# Patient Record
Sex: Female | Born: 1960
Health system: Southern US, Community
[De-identification: ages and names within clinical notes are randomized; demographics above are authoritative.]

## PROBLEM LIST (undated history)

## (undated) DIAGNOSIS — R32 Unspecified urinary incontinence: Secondary | ICD-10-CM

## (undated) DIAGNOSIS — D649 Anemia, unspecified: Secondary | ICD-10-CM

## (undated) HISTORY — DX: Anemia, unspecified: D64.9

## (undated) HISTORY — DX: Unspecified urinary incontinence: R32

---

## 1989-06-19 HISTORY — PX: TUBAL LIGATION: SHX77

## 2003-01-20 ENCOUNTER — Encounter: Payer: Self-pay | Admitting: Family Medicine

## 2003-01-20 ENCOUNTER — Encounter: Admission: RE | Admit: 2003-01-20 | Discharge: 2003-01-20 | Payer: Self-pay | Admitting: Family Medicine

## 2003-02-20 ENCOUNTER — Encounter: Payer: Self-pay | Admitting: Family Medicine

## 2003-02-20 ENCOUNTER — Encounter: Admission: RE | Admit: 2003-02-20 | Discharge: 2003-02-20 | Payer: Self-pay | Admitting: Family Medicine

## 2003-02-27 ENCOUNTER — Encounter (INDEPENDENT_AMBULATORY_CARE_PROVIDER_SITE_OTHER): Payer: Self-pay | Admitting: Specialist

## 2003-02-27 ENCOUNTER — Ambulatory Visit (HOSPITAL_BASED_OUTPATIENT_CLINIC_OR_DEPARTMENT_OTHER): Admission: RE | Admit: 2003-02-27 | Discharge: 2003-02-27 | Payer: Self-pay | Admitting: Surgery

## 2004-04-08 ENCOUNTER — Other Ambulatory Visit: Admission: RE | Admit: 2004-04-08 | Discharge: 2004-04-08 | Payer: Self-pay | Admitting: Family Medicine

## 2004-06-19 HISTORY — PX: LIPOMA EXCISION: SHX5283

## 2006-05-08 ENCOUNTER — Encounter: Admission: RE | Admit: 2006-05-08 | Discharge: 2006-05-08 | Payer: Self-pay | Admitting: Family Medicine

## 2007-05-13 ENCOUNTER — Encounter: Admission: RE | Admit: 2007-05-13 | Discharge: 2007-05-13 | Payer: Self-pay | Admitting: Family Medicine

## 2008-05-18 ENCOUNTER — Encounter: Admission: RE | Admit: 2008-05-18 | Discharge: 2008-05-18 | Payer: Self-pay | Admitting: Family Medicine

## 2008-05-20 ENCOUNTER — Encounter: Admission: RE | Admit: 2008-05-20 | Discharge: 2008-05-20 | Payer: Self-pay | Admitting: Family Medicine

## 2009-04-16 ENCOUNTER — Emergency Department (HOSPITAL_COMMUNITY): Admission: EM | Admit: 2009-04-16 | Discharge: 2009-04-16 | Payer: Self-pay | Admitting: Emergency Medicine

## 2009-04-16 ENCOUNTER — Ambulatory Visit: Payer: Self-pay | Admitting: Family Medicine

## 2009-04-16 DIAGNOSIS — R109 Unspecified abdominal pain: Secondary | ICD-10-CM | POA: Insufficient documentation

## 2009-04-16 LAB — CONVERTED CEMR LAB
Nitrite: NEGATIVE
Urobilinogen, UA: 0.2
pH: >9

## 2009-04-17 ENCOUNTER — Encounter: Payer: Self-pay | Admitting: Family Medicine

## 2009-04-17 ENCOUNTER — Emergency Department (HOSPITAL_COMMUNITY): Admission: EM | Admit: 2009-04-17 | Discharge: 2009-04-17 | Payer: Self-pay | Admitting: Emergency Medicine

## 2009-05-10 ENCOUNTER — Encounter: Admission: RE | Admit: 2009-05-10 | Discharge: 2009-05-10 | Payer: Self-pay | Admitting: Family Medicine

## 2010-05-16 ENCOUNTER — Encounter: Admission: RE | Admit: 2010-05-16 | Discharge: 2010-05-16 | Payer: Self-pay | Admitting: Family Medicine

## 2010-11-04 NOTE — Op Note (Signed)
   NAME:  [name], [name]                         ACCOUNT NO.:  [id number]   MEDICAL RECORD NO.:  [id number]                   PATIENT TYPE:  AMB   LOCATION:  DSC                                  FACILITY:  [facility]   PHYSICIAN:  Abigail Miyamoto, M.D.              DATE OF BIRTH:  [birth year]   DATE OF PROCEDURE:  02/27/2003  DATE OF DISCHARGE:                                 OPERATIVE REPORT   PREOPERATIVE DIAGNOSIS:  Left chest wall mass.   POSTOPERATIVE DIAGNOSIS:  Left chest wall mass.   PROCEDURE:  Excision of left chest wall mass.   SURGEON:  Abigail Miyamoto, M.D.   ANESTHESIA:  1% lidocaine and monitored anesthesia care.   ESTIMATED BLOOD LOSS:  Minimal.   PROCEDURE IN DETAIL:  The patient was brought to the operating room,  identified as Anne Greer.  She was placed supine on the operating room  table, and anesthesia was induced.  Her left chest underneath the breast on  the anterior surface was then prepped and draped in the usual sterile  fashion.  The palpable mass was identified right along the costal margin.  The skin overlying the mass was anesthetized with 1% lidocaine.  An incision  was then made in the skin transversely across the mass.  The incision was  taken down into the subcutaneous tissue, and the mass was identified and  found to be consistent with a small approximately 2 cm in size lipoma.  The  mass was excised and sent to pathology for identification.  No other  abnormalities were identified.  The wound was then thoroughly irrigated with  saline.  The subcutaneous layer was then closed with interrupted 3-0 Vicryl  sutures, and the skin was closed with a running 4-0 Monocryl.  Steri-Strips,  gauze, and Tegaderm was then applied.  The patient tolerated the procedure  well.  All sponge, needle, and instrument counts were correct at the end of  the procedure.  The patient was then taken in a stable condition from the  operating room to the recovery  room.                                               Abigail Miyamoto, M.D.    DB/MEDQ  D:  02/27/2003  T:  02/28/2003  Job:  [id number]

## 2011-04-26 ENCOUNTER — Other Ambulatory Visit: Payer: Self-pay | Admitting: Family Medicine

## 2011-04-26 DIAGNOSIS — Z1231 Encounter for screening mammogram for malignant neoplasm of breast: Secondary | ICD-10-CM

## 2011-05-19 ENCOUNTER — Ambulatory Visit
Admission: RE | Admit: 2011-05-19 | Discharge: 2011-05-19 | Disposition: A | Discharge status: Home / Self Care | Payer: BC Managed Care – PPO | Source: Ambulatory Visit | Attending: Family Medicine | Admitting: Family Medicine

## 2011-05-19 DIAGNOSIS — Z1231 Encounter for screening mammogram for malignant neoplasm of breast: Secondary | ICD-10-CM

## 2012-05-01 ENCOUNTER — Other Ambulatory Visit: Payer: Self-pay | Admitting: Family Medicine

## 2012-05-01 DIAGNOSIS — Z1231 Encounter for screening mammogram for malignant neoplasm of breast: Secondary | ICD-10-CM

## 2012-05-27 ENCOUNTER — Ambulatory Visit
Admission: RE | Admit: 2012-05-27 | Discharge: 2012-05-27 | Disposition: A | Discharge status: Home / Self Care | Payer: BC Managed Care – PPO | Source: Ambulatory Visit | Attending: Family Medicine | Admitting: Family Medicine

## 2012-05-27 DIAGNOSIS — Z1231 Encounter for screening mammogram for malignant neoplasm of breast: Secondary | ICD-10-CM

## 2013-01-23 ENCOUNTER — Ambulatory Visit (INDEPENDENT_AMBULATORY_CARE_PROVIDER_SITE_OTHER): Payer: BC Managed Care – PPO | Admitting: Gynecology

## 2013-01-23 ENCOUNTER — Encounter: Payer: Self-pay | Admitting: Gynecology

## 2013-01-23 VITALS — BP 112/60 | HR 70 | Resp 18 | Ht 65.0 in | Wt 185.0 lb

## 2013-01-23 DIAGNOSIS — N920 Excessive and frequent menstruation with regular cycle: Secondary | ICD-10-CM

## 2013-01-23 DIAGNOSIS — N852 Hypertrophy of uterus: Secondary | ICD-10-CM

## 2013-01-23 DIAGNOSIS — Z0000 Encounter for general adult medical examination without abnormal findings: Secondary | ICD-10-CM

## 2013-01-23 DIAGNOSIS — D62 Acute posthemorrhagic anemia: Secondary | ICD-10-CM

## 2013-01-23 LAB — CBC
HCT: 34.1 % — ABNORMAL LOW (ref 36.0–46.0)
Hemoglobin: 11.1 g/dL — ABNORMAL LOW (ref 12.0–15.0)
MCHC: 32.6 g/dL (ref 30.0–36.0)
MCV: 88.3 fL (ref 78.0–100.0)
RDW: 14.1 % (ref 11.5–15.5)
WBC: 8.9 10*3/uL (ref 4.0–10.5)

## 2013-01-23 LAB — POCT URINALYSIS DIPSTICK: pH, UA: 5

## 2013-01-23 MED ORDER — NORETHINDRONE ACETATE 5 MG PO TABS: 5.0000 mg | ORAL_TABLET | Freq: Three times a day (TID) | ORAL | Status: DC

## 2013-01-23 NOTE — Progress Notes (Signed)
52 y.o. Engaged Caucasian female  G2P2002  here for problem visit. Pt reports menses are not regular.  She does report hot flashes, does have night sweats, does have vaginal dryness.  She is using lubricants, otc lubricant.  She does report peri-menopasual bleeding.  Pt reports cycles close to monthly, last 2 cycles very light brown.  Pt states that she started heavy bleeding red with clots plum size, bleeding started 7/18 almost 3w, bleeding every day.  Pt stated taking iron 4d ago.  Pt denies history of fibroids, ovarian cysts. Last PAP was abnormal but follow up normal and had not returned since.  Pt denies postcoital bleeding.    Patient's last menstrual period was 01/03/2013.          Sexually active: yes  The current method of family planning is tubal ligation.    Exercising: no  Not regularly Last pap: 7-8 years ago Abnormal PAP: yes, had a repeat pap was fine  Mammogram: 11/13 BSE: yes Colonoscopy: none DEXA: none  Alcohol: no Tobacco:no  Hgb: 10.9  ;  Urine: Blood 2   Health Maintenance  Topic Date Due  . Pap Smear  04/14/1979  . Tetanus/tdap  04/13/1980  . Colonoscopy  04/14/2011  . Influenza Vaccine  02/17/2013  . Mammogram  05/27/2014    Family History  Problem Relation Age of Onset  . Heart attack Father   . Cancer Maternal Grandmother   . Cancer Paternal Grandmother     Patient Active Problem List   Diagnosis Date Noted  . ABDOMINAL PAIN, ACUTE 04/16/2009    Past Medical History  Diagnosis Date  . Anemia     Past Surgical History  Procedure Laterality Date  . Tubal ligation  1991  . Lipoma excision Left 2006    Breast (fatty tissue not cancerous)    Allergies: Codeine  Current Outpatient Prescriptions  Medication Sig Dispense Refill  . CALCIUM PO Take by mouth.      . Fe Bisgly-Succ-C-Thre-B12-FA (IRON-150 PO) Take by mouth. Has been taking it since 01/18/13      . Multiple Vitamins-Minerals (MULTIVITAMIN PO) Take by mouth.      . Omega-3 Fatty  Acids (FISH OIL PO) Take by mouth.      . B Complex-C (SUPER B COMPLEX PO) Take by mouth.       No current facility-administered medications for this visit.    ROS: Pertinent items are noted in HPI.  Exam:    BP 112/60  Pulse 70  Resp 18  Ht 5\' 5"  (1.651 m)  Wt 185 lb (83.915 kg)  BMI 30.79 kg/m2  LMP 01/03/2013 Weight change: @WEIGHTCHANGE @ Last 3 height recordings:  Ht Readings from Last 3 Encounters:  01/23/13 5\' 5"  (1.651 m)   General appearance: alert, cooperative and appears stated age Head: Normocephalic, without obvious abnormality, atraumatic Neck: no adenopathy, no carotid bruit, no JVD, supple, symmetrical, trachea midline and thyroid not enlarged, symmetric, no tenderness/mass/nodules Lungs: clear to auscultation bilaterally Breasts: normal appearance, no masses or tenderness , mild breakdown of skin between breasts Heart: regular rate and rhythm, S1, S2 normal, no murmur, click, rub or gallop Abdomen: soft, non-tender; bowel sounds normal; no masses,  no organomegaly Extremities: extremities normal, atraumatic, no cyanosis or edema Skin: Skin color, texture, turgor normal. No rashes or lesions Lymph nodes: Cervical, supraclavicular, and axillary nodes normal. no inguinal nodes palpated Neurologic: Grossly normal   Pelvic: External genitalia:  no lesions  Urethra: normal appearing urethra with no masses, tenderness or lesions              Bartholins and Skenes: normal                 Vagina: moderate menstrum              Cervix: normal appearance              Pap taken: yes        Bimanual Exam:  Uterus:  irregular, retroflexed                                      Adnexa:    Right sided fullness, mobile, nontender                                      Rectovaginal: posterior fullness, irregular                                      Anus:  normal sphincter tone, no lesions  A: menorrhagia Anemia Adnexal mass vs fibroid uterus Dermal yeast      P: pap smear obtained with surepath Recommend endometrial biopsy  risks and benefits reviewed and accepted. cervix cleansed with betadine, xylocaine jelly placed, moderate tissue obtained on single pass, pt tolerated well, tissue to pathology pelvic ultrasound Continue otc vitamen and iron otc antifungal between breasts return annually or prn Discussed PAP guideline changes, importance of weight bearing exercises, calcium, vit D and balanced diet.  An After Visit Summary was printed and given to the patient.

## 2013-01-23 NOTE — Patient Instructions (Addendum)
Menorrhagia Dysfunctional uterine bleeding is different from a normal menstrual period. When periods are heavy or there is more bleeding than is usual for you, it is called menorrhagia. It may be caused by hormonal imbalance, or physical, metabolic, or other problems. Examination is necessary in order that your caregiver may treat treatable causes. If this is a continuing problem, a D&C may be needed. That means that the cervix (the opening of the uterus or womb) is dilated (stretched larger) and the lining of the uterus is scraped out. The tissue scraped out is then examined under a microscope by a specialist (pathologist) to make sure there is nothing of concern that needs further or more extensive treatment. HOME CARE INSTRUCTIONS   If medications were prescribed, take exactly as directed. Do not change or switch medications without consulting your caregiver.  Long term heavy bleeding may result in iron deficiency. Your caregiver may have prescribed iron pills. They help replace the iron your body lost from heavy bleeding. Take exactly as directed. Iron may cause constipation. If this becomes a problem, increase the bran, fruits, and roughage in your diet.  Do not take aspirin or medicines that contain aspirin one week before or during your menstrual period. Aspirin may make the bleeding worse.  If you need to change your sanitary pad or tampon more than once every 2 hours, stay in bed and rest as much as possible until the bleeding stops.  Eat well-balanced meals. Eat foods high in iron. Examples are leafy green vegetables, meat, liver, eggs, and whole grain breads and cereals. Do not try to lose weight until the abnormal bleeding has stopped and your blood iron level is back to normal. SEEK MEDICAL CARE IF:   You need to change your sanitary pad or tampon more than once an hour.  You develop nausea (feeling sick to your stomach) and vomiting, dizziness, or diarrhea while you are taking your  medicine.  You have any problems that may be related to the medicine you are taking. SEEK IMMEDIATE MEDICAL CARE IF:   You have a fever.  You develop chills.  You develop severe bleeding or start to pass blood clots.  You feel dizzy or faint. MAKE SURE YOU:   Understand these instructions.  Will watch your condition.  Will get help right away if you are not doing well or get worse. Document Released: 06/05/2005 Document Revised: 08/28/2011 Document Reviewed: 01/24/2008 [facility] Patient Information 2014 [place], [place].  Begin aygestin 3x/d until bleeding stops, then decrease twice a day for 1w then decrease to once a day until rx completed Call if bleeding restarts with any of the transitions in dose   Transvaginal Ultrasound Transvaginal ultrasound is a pelvic ultrasound, using a metal probe that is placed in the vagina, to look at a women's female organs. Transvaginal ultrasound is a method of seeing inside the pelvis of a woman. The ultrasound machine sends out sound waves from the transducer (probe). These sound waves bounce off body structures (like an echo) to create a picture. The picture shows up on a monitor. It is called transvaginal because the probe is inserted into the vagina. There should be very little discomfort from the vaginal probe. This test can also be used during pregnancy. Endovaginal ultrasound is another name for a transvaginal ultrasound. In a transabdominal ultrasound, the probe is placed on the outside of the belly. This method gives pictures that are lower quality than pictures from the transvaginal technique. Transvaginal ultrasound is used to look  for problems of the female genital tract. Some such problems include:  Infertility problems.  Congenital (birth defect) malformations of the uterus and ovaries.  Tumors in the uterus.  Abnormal bleeding.  Ovarian tumors and cysts.  Abscess (inflamed tissue around pus) in the pelvis.  Unexplained  abdominal or pelvic pain.  Pelvic infection. DURING PREGNANCY, TRANSVAGINAL ULTRASOUND MAY BE USED TO LOOK AT:  Normal pregnancy.  Ectopic pregnancy (pregnancy outside the uterus).  Fetal heartbeat.  Abnormalities in the pelvis, that are not seen well with transabdominal ultrasound.  Suspected twins or multiples.  Impending miscarriage.  Problems with the cervix (incompetent cervix, not able to stay closed and hold the baby).  When doing an amniocentesis (removing fluid from the pregnancy sac, for testing).  Looking for abnormalities of the baby.  Checking the growth, development, and age of the fetus.  Measuring the amount of fluid in the amniotic sac.  When doing an external version of the baby (moving baby into correct position).  Evaluating the baby for problems in high risk pregnancies (biophysical profile).  Suspected fetal demise (death). Sometimes a special ultrasound method called Saline Infusion Sonography (SIS) is used for a more accurate look at the uterus. Sterile saline (salt water) is injected into the uterus of non-pregnant patients to see the inside of the uterus better. SIS is not used on pregnant women. The vaginal probe can also assist in obtaining biopsies of abnormal areas, in draining fluid from cysts on the ovary, and in finding IUDs (intrauterine device, birth control) that cannot be located. PREPARATION FOR TEST A transvaginal ultrasound is done with the bladder empty. The transabdominal ultrasound is done with your bladder full. You may be asked to drink several glasses of water before that exam. Sometimes, a transabdominal ultrasound is done just after a transvaginal ultrasound, to look at organs in your abdomen. PROCEDURE  You will lie down on a table, with your knees bent and your feet in foot holders. The probe is covered with a condom. A sterile lubricant is put into the vagina and on the probe. The lubricant helps transmit the sound waves and avoid  irritating the vagina. Your caregiver will move the probe inside the vaginal cavity to scan the pelvic structures. A normal test will show a normal pelvis and normal contents. An abnormal test will show abnormalities of the pelvis, placenta, or baby. ABNORMAL RESULTS MAY BE DUE TO:  Growths or tumors in the:  Uterus.  Ovaries.  Vagina.  Other pelvic structures.  Non-cancerous growths of the uterus and ovaries.  Twisting of the ovary, cutting off blood supply to the ovary (ovarian torsion).  Areas of infection, including:  Pelvic inflammatory disease.  Abscess in the pelvis.  Locating an IUD. PROBLEMS FOUND IN PREGNANT WOMEN MAY INCLUDE:  Ectopic pregnancy (pregnancy outside the uterus).  Multiple pregnancies.  Early dilation (opening) of the cervix. This may indicate an incompetent cervix and early delivery.  Impending miscarriage.  Fetal death.  Problems with the placenta, including:  Placenta has grown over the opening of the womb (placenta previa).  Placenta has separated early in the womb (placental abruption).  Placenta grows into the muscle of the uterus (placenta accreta).  Tumors of pregnancy, including gestational trophoblastic disease. This is an abnormal pregnancy, with no fetus. The uterus is filled with many grape-like cysts that could sometimes be cancerous.  Incorrect position of the fetus (breech, vertex).  Intrauterine fetal growth retardation (IUGR) (poor growth in the womb).  Fetal abnormalities or infection.  RISKS AND COMPLICATIONS There are no known risks to the ultrasound procedure. There is no X-ray used when doing an ultrasound. Document Released: 05/17/2004 Document Revised: 08/28/2011 Document Reviewed: 05/05/2009 [facility] Patient Information 2014 [place], [place].

## 2013-01-24 LAB — TSH: TSH: 3.8 u[IU]/mL (ref 0.350–4.500)

## 2013-01-24 LAB — IBC PANEL: TIBC: 348 ug/dL (ref 250–470)

## 2013-01-24 LAB — FERRITIN: Ferritin: 10 ng/mL (ref 10–291)

## 2013-01-27 LAB — IPS PAP TEST WITH HPV

## 2013-01-31 ENCOUNTER — Telehealth: Payer: Self-pay | Admitting: Gynecology

## 2013-01-31 NOTE — Telephone Encounter (Signed)
Call to pt on home number. No answer.  LMTCB on mobile number to discuss ins benefits  And schedule PUS.

## 2013-02-03 ENCOUNTER — Telehealth: Payer: Self-pay | Admitting: *Deleted

## 2013-02-03 NOTE — Telephone Encounter (Signed)
Message copied by Alisa Graff on Mon Feb 03, 2013  5:56 PM ------      Message from: Douglass Rivers      Created: Mon Jan 27, 2013  6:13 PM       Inform PAP normal, anemic needs to continue Iron supplementation with multivitamen.  Endo biopsy is benign and te progestin was the right hormone to give, will explain more completely at u/s, please see how she is doing regarding bleeding ------

## 2013-02-03 NOTE — Telephone Encounter (Signed)
Anne Greer has given results.  Icalled patient to assist in scheduling PUS. LMTCB on cell.  No answer and no VM on home number.

## 2013-02-04 ENCOUNTER — Telehealth: Payer: Self-pay | Admitting: Gynecology

## 2013-02-04 NOTE — Telephone Encounter (Signed)
Patient returning your phone call.   

## 2013-02-04 NOTE — Telephone Encounter (Signed)
Spoke with patient about her ins benefits for pus and scheduled her for 9/9. She currently has strep throat and bronchitis. Her dr told her it may take up to 3 weeks to go away (according to pt). And wanted to wait to schedule. She agreed to go ahead and schedule for three weeks out. Advised to give at least a 24 hour notice if she needs to cancel.

## 2013-02-14 ENCOUNTER — Other Ambulatory Visit: Payer: Self-pay | Admitting: Orthopedic Surgery

## 2013-02-14 DIAGNOSIS — N920 Excessive and frequent menstruation with regular cycle: Secondary | ICD-10-CM

## 2013-02-14 MED ORDER — NORETHINDRONE ACETATE 5 MG PO TABS: 5.0000 mg | ORAL_TABLET | Freq: Two times a day (BID) | ORAL | Status: DC

## 2013-02-14 NOTE — Telephone Encounter (Signed)
Patient calling to speak with nurse about medicine to help manage periods. Also wants to know if there is a less-expensive medicine than last one prescribed. Also has some questions prior to appointment.  [facility] [phone] Kathryne Sharper

## 2013-02-14 NOTE — Telephone Encounter (Signed)
Patient called to see if anyone had taken care of this.

## 2013-02-14 NOTE — Telephone Encounter (Signed)
Spoke with pt who was taking aygestin for heavy periods. Saw TL 01-23-13. Reports it helped a lot with controlling bleeding, but within 3 days of stopping med, she started bleeding again. Pt took med as TL had directed. Pt sched for PUS 02-25-13. Pt concerned about bleeding between now and her PUS appt, especially with long weekend coming up. Wondering if she should take another round of aygestin. Pt would need a refill sent in. Please advise.

## 2013-02-14 NOTE — Telephone Encounter (Signed)
Per TL, pt to take aygestin twice daily for 3 days, then once daily until she returns for PUS appt on 02-25-13. Rx sent to pharmacy. Pt notified.

## 2013-02-18 NOTE — Progress Notes (Signed)
PUS sched for 02-25-13.

## 2013-02-25 ENCOUNTER — Ambulatory Visit (INDEPENDENT_AMBULATORY_CARE_PROVIDER_SITE_OTHER): Payer: BC Managed Care – PPO | Admitting: Gynecology

## 2013-02-25 ENCOUNTER — Ambulatory Visit (INDEPENDENT_AMBULATORY_CARE_PROVIDER_SITE_OTHER): Payer: BC Managed Care – PPO

## 2013-02-25 ENCOUNTER — Encounter: Payer: Self-pay | Admitting: Gynecology

## 2013-02-25 VITALS — BP 120/60 | HR 66 | Resp 18 | Ht 65.0 in | Wt 187.0 lb

## 2013-02-25 DIAGNOSIS — E039 Hypothyroidism, unspecified: Secondary | ICD-10-CM

## 2013-02-25 DIAGNOSIS — D62 Acute posthemorrhagic anemia: Secondary | ICD-10-CM

## 2013-02-25 DIAGNOSIS — D259 Leiomyoma of uterus, unspecified: Secondary | ICD-10-CM

## 2013-02-25 DIAGNOSIS — N852 Hypertrophy of uterus: Secondary | ICD-10-CM

## 2013-02-25 DIAGNOSIS — N920 Excessive and frequent menstruation with regular cycle: Secondary | ICD-10-CM

## 2013-02-25 DIAGNOSIS — E038 Other specified hypothyroidism: Secondary | ICD-10-CM

## 2013-02-25 LAB — CBC
HCT: 36.6 % (ref 36.0–46.0)
MCH: 28.9 pg (ref 26.0–34.0)
MCHC: 33.1 g/dL (ref 30.0–36.0)
MCV: 87.4 fL (ref 78.0–100.0)
RDW: 13.6 % (ref 11.5–15.5)

## 2013-02-25 LAB — RETICULOCYTES: RBC.: 4.19 MIL/uL (ref 3.87–5.11)

## 2013-02-25 MED ORDER — LEVOTHYROXINE SODIUM 50 MCG PO TABS: 50.0000 ug | ORAL_TABLET | Freq: Every day | ORAL | Status: DC

## 2013-02-25 NOTE — Progress Notes (Signed)
     Pt here follow up of severe menorrhagia with subsequent anemia, she is taking iron twice a day and MVI. Pt initially treated with progestin and bleeding quickly stopped but she rebled after finishing rx and asked to restart until today's u/s appointment. U/s today shows a retroverted uterus with 4 small fibroids, none of which impinge on cavity. MS 3.6.  Pt informed. Recommend either watching cycles off progestin as lining was disordered on EMB, treating monthly with progestin or supplementing her TSH which was 3.8, subclinical.  Discussed affects of TSH on FSH and bleeding.  All 3 options extensively reviewed with pt and after further discussion, she elects to try the synthroid. Rx called in. Recheck in 6w-lab appt to be made Pt instructed to call if repeat of bleeding occurs, otherwise we will see her back in 27m Repeat CBC, retic count, ferritin done today, we will contact with results Length of time discussing menorrhagia and perimenopause 44m

## 2013-02-26 ENCOUNTER — Telehealth: Payer: Self-pay | Admitting: *Deleted

## 2013-02-26 LAB — FERRITIN: Ferritin: 9 ng/mL — ABNORMAL LOW (ref 10–291)

## 2013-02-26 NOTE — Telephone Encounter (Signed)
Message copied by Lorraine Lax on Wed Feb 26, 2013  1:16 PM ------      Message from: Douglass Rivers      Created: Wed Feb 26, 2013 12:51 PM       Inform responding well to iron can decrease to once a day ------

## 2013-02-26 NOTE — Telephone Encounter (Signed)
Left Message To Call Back  

## 2013-02-27 NOTE — Telephone Encounter (Signed)
Patient was returning Jazzy's call from yesterday. Wanted to speak with someone else regarding her results.

## 2013-03-03 NOTE — Telephone Encounter (Signed)
Patient notified by Ernest Haber, CMA

## 2013-03-11 ENCOUNTER — Telehealth: Payer: Self-pay | Admitting: Gynecology

## 2013-03-11 NOTE — Telephone Encounter (Signed)
Patient has some questions about some medications if it is ok to take them together.

## 2013-03-11 NOTE — Telephone Encounter (Signed)
Patient has been given Levothroxine by dr lathrop.  Patient takes Unisom for sleep and green tea supplement as well as drinking a lot of green tea.  Are these products ok to take with each other?    Patient aware not to take Levothyroxine with her calcium. She states she takes her calcium and iron later in day.  Discussed best not to take iron with her calcium.  Calcium blocks iron while Vit C will enhance absorption, verbalized understanding.  Patient aware we will confirm with Dr lathrop and call her back.

## 2013-04-08 ENCOUNTER — Other Ambulatory Visit: Payer: BC Managed Care – PPO

## 2013-04-17 ENCOUNTER — Other Ambulatory Visit: Payer: BC Managed Care – PPO

## 2013-05-21 ENCOUNTER — Other Ambulatory Visit: Payer: Self-pay

## 2013-05-21 DIAGNOSIS — Z1231 Encounter for screening mammogram for malignant neoplasm of breast: Secondary | ICD-10-CM

## 2013-06-23 ENCOUNTER — Ambulatory Visit
Admission: RE | Admit: 2013-06-23 | Discharge: 2013-06-23 | Disposition: A | Discharge status: Home / Self Care | Payer: BC Managed Care – PPO | Source: Ambulatory Visit

## 2013-06-23 DIAGNOSIS — Z1231 Encounter for screening mammogram for malignant neoplasm of breast: Secondary | ICD-10-CM

## 2013-06-24 ENCOUNTER — Telehealth: Payer: Self-pay | Admitting: Gynecology

## 2013-06-24 NOTE — Telephone Encounter (Signed)
Patient canceled her 4 month reck appointment 06/27/13. Patient will call later to reschedule.

## 2013-06-27 ENCOUNTER — Ambulatory Visit: Payer: BC Managed Care – PPO | Admitting: Gynecology

## 2013-07-09 NOTE — Telephone Encounter (Signed)
PUS was performed 02-25-13.  Routed to provider, encounter closed.

## 2014-01-26 ENCOUNTER — Ambulatory Visit: Payer: BC Managed Care – PPO | Admitting: Gynecology

## 2014-04-20 ENCOUNTER — Encounter: Payer: Self-pay | Admitting: Gynecology

## 2014-05-18 ENCOUNTER — Other Ambulatory Visit: Payer: Self-pay

## 2014-05-18 DIAGNOSIS — Z1231 Encounter for screening mammogram for malignant neoplasm of breast: Secondary | ICD-10-CM

## 2014-06-24 ENCOUNTER — Ambulatory Visit: Payer: BC Managed Care – PPO

## 2014-06-25 ENCOUNTER — Ambulatory Visit
Admission: RE | Admit: 2014-06-25 | Discharge: 2014-06-25 | Disposition: A | Discharge status: Home / Self Care | Payer: Self-pay | Source: Ambulatory Visit

## 2014-06-25 DIAGNOSIS — Z1231 Encounter for screening mammogram for malignant neoplasm of breast: Secondary | ICD-10-CM

## 2015-05-03 ENCOUNTER — Other Ambulatory Visit: Payer: Self-pay

## 2015-05-03 DIAGNOSIS — Z1231 Encounter for screening mammogram for malignant neoplasm of breast: Secondary | ICD-10-CM

## 2015-06-29 ENCOUNTER — Ambulatory Visit: Payer: BLUE CROSS/BLUE SHIELD

## 2015-07-15 ENCOUNTER — Ambulatory Visit
Admission: RE | Admit: 2015-07-15 | Discharge: 2015-07-15 | Disposition: A | Discharge status: Home / Self Care | Payer: BLUE CROSS/BLUE SHIELD | Source: Ambulatory Visit

## 2015-07-15 DIAGNOSIS — Z1231 Encounter for screening mammogram for malignant neoplasm of breast: Secondary | ICD-10-CM

## 2016-06-01 ENCOUNTER — Other Ambulatory Visit: Payer: Self-pay | Admitting: Family Medicine

## 2016-06-01 DIAGNOSIS — Z1231 Encounter for screening mammogram for malignant neoplasm of breast: Secondary | ICD-10-CM

## 2016-07-17 ENCOUNTER — Ambulatory Visit
Admission: RE | Admit: 2016-07-17 | Discharge: 2016-07-17 | Disposition: A | Discharge status: Home / Self Care | Payer: BLUE CROSS/BLUE SHIELD | Source: Ambulatory Visit | Attending: Family Medicine | Admitting: Family Medicine

## 2016-07-17 DIAGNOSIS — Z1231 Encounter for screening mammogram for malignant neoplasm of breast: Secondary | ICD-10-CM

## 2016-09-12 DIAGNOSIS — H5213 Myopia, bilateral: Secondary | ICD-10-CM | POA: Diagnosis not present

## 2016-09-12 DIAGNOSIS — H53001 Unspecified amblyopia, right eye: Secondary | ICD-10-CM | POA: Diagnosis not present

## 2016-11-28 DIAGNOSIS — J0140 Acute pansinusitis, unspecified: Secondary | ICD-10-CM | POA: Diagnosis not present

## 2016-11-28 DIAGNOSIS — J209 Acute bronchitis, unspecified: Secondary | ICD-10-CM | POA: Diagnosis not present

## 2017-05-08 ENCOUNTER — Other Ambulatory Visit: Payer: Self-pay | Admitting: Family Medicine

## 2017-05-08 DIAGNOSIS — Z1231 Encounter for screening mammogram for malignant neoplasm of breast: Secondary | ICD-10-CM

## 2017-07-18 ENCOUNTER — Ambulatory Visit
Admission: RE | Admit: 2017-07-18 | Discharge: 2017-07-18 | Disposition: A | Discharge status: Home / Self Care | Payer: BLUE CROSS/BLUE SHIELD | Source: Ambulatory Visit | Attending: Family Medicine | Admitting: Family Medicine

## 2017-07-18 DIAGNOSIS — Z1231 Encounter for screening mammogram for malignant neoplasm of breast: Secondary | ICD-10-CM

## 2017-07-18 IMAGING — MG 2D DIGITAL SCREENING BILATERAL MAMMOGRAM WITH 3D TOMO WITH CAD
8 of 13 series · 8 of 29 positions shown · non-contrast
Comparison: Previous exam(s).

CLINICAL DATA: Screening.

EXAM:
2D DIGITAL SCREENING BILATERAL MAMMOGRAM WITH 3D TOMO WITH CAD

[R MLO (1 of 2)]
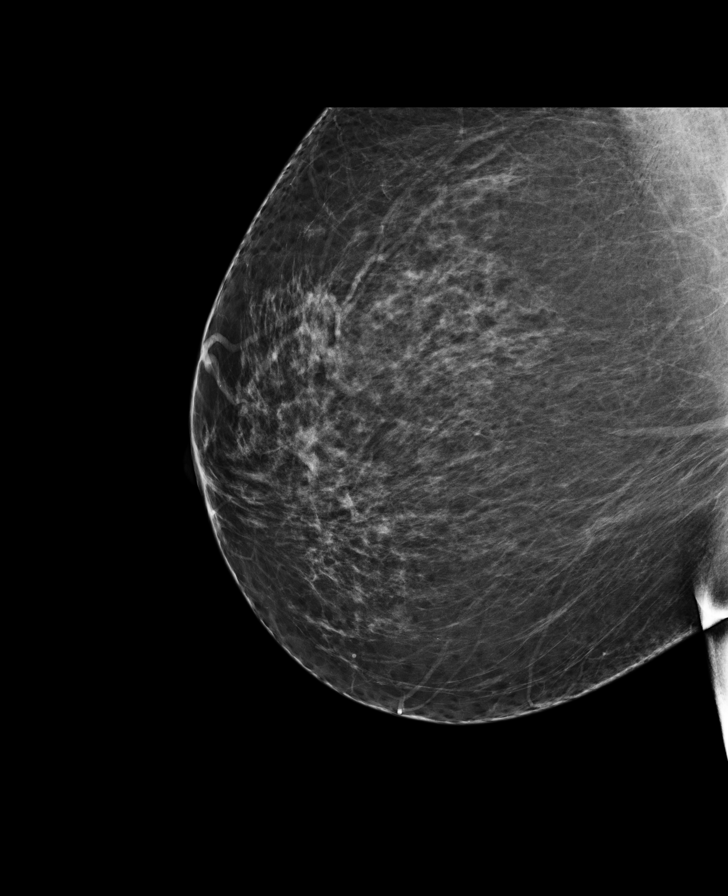

[L CC]
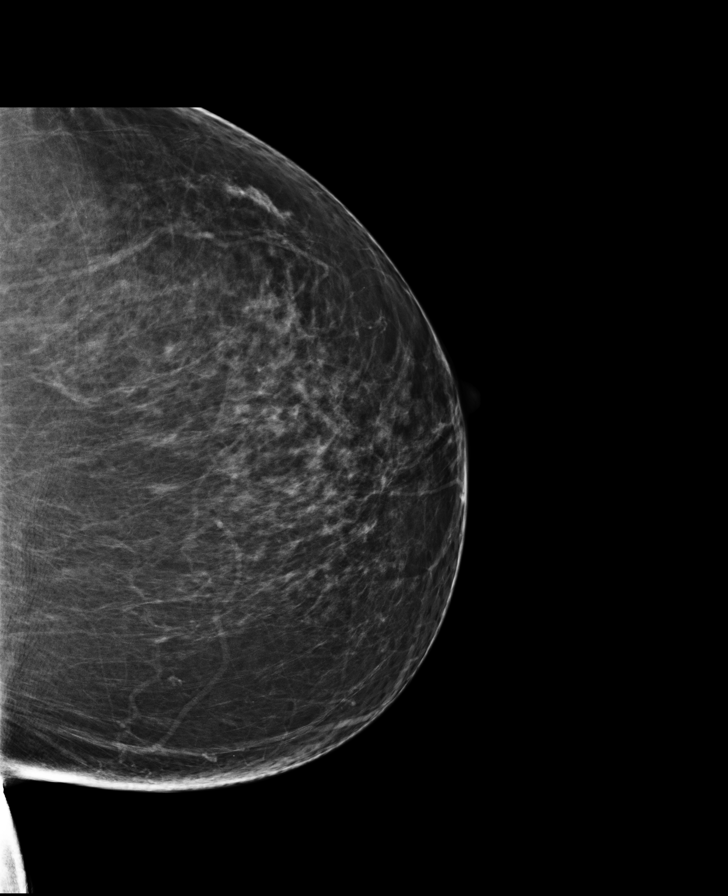

[R CC synth-2D]
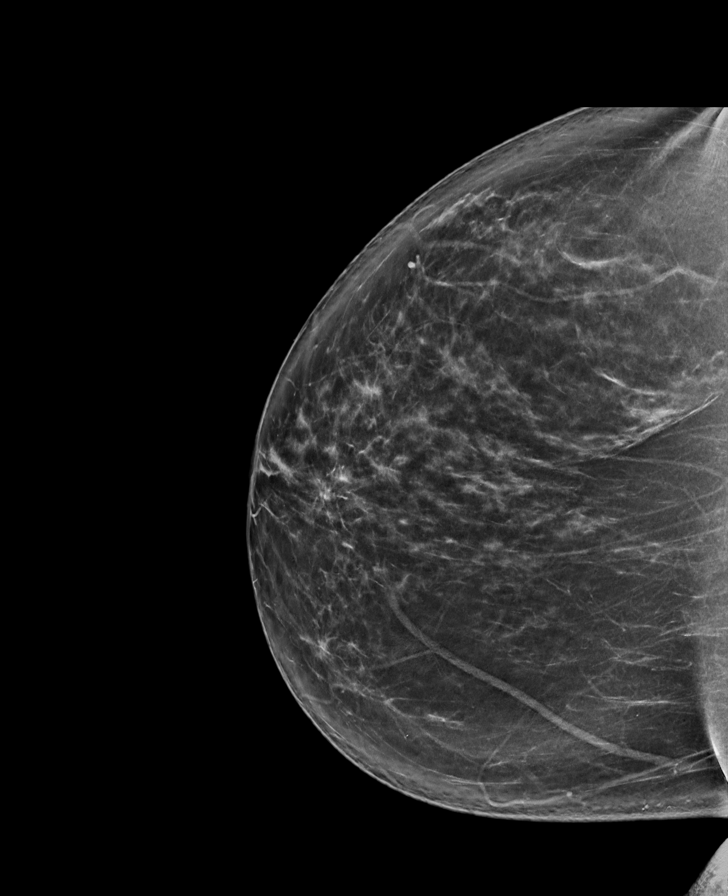

[R MLO synth-2D]
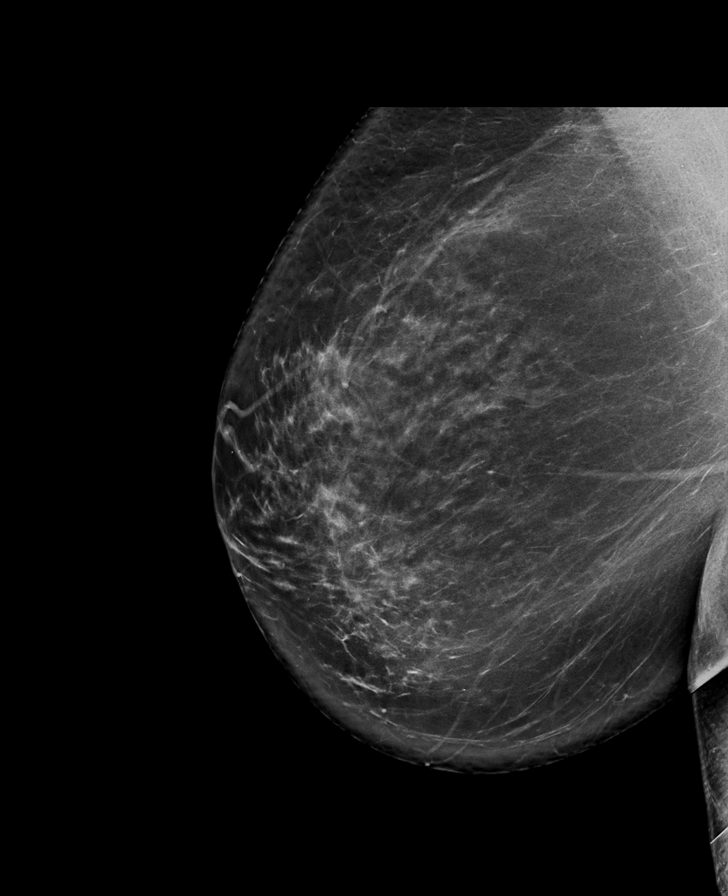

[R MLO (2 of 2)]
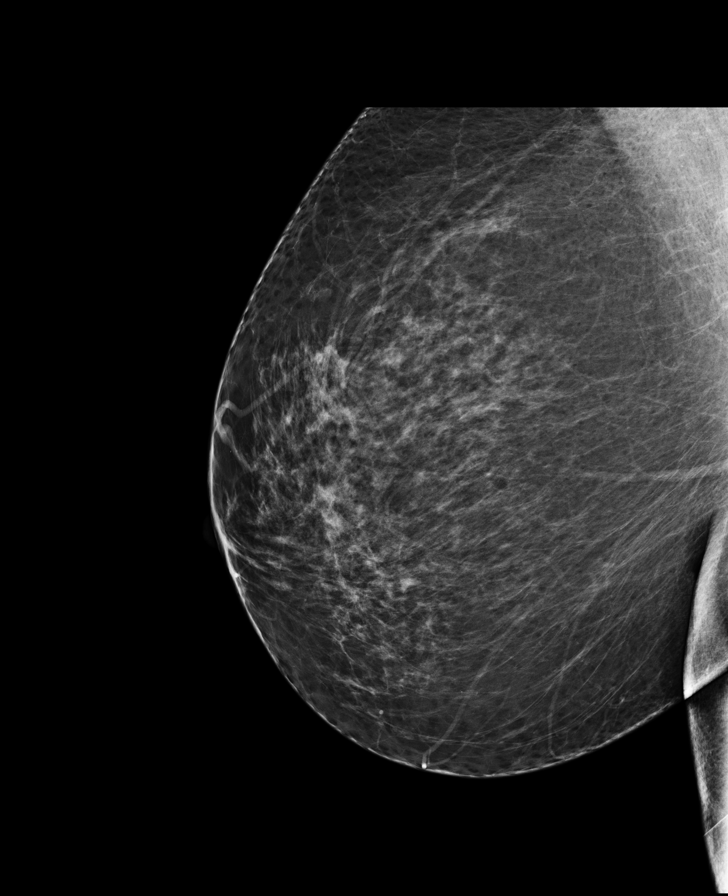

[R CC]
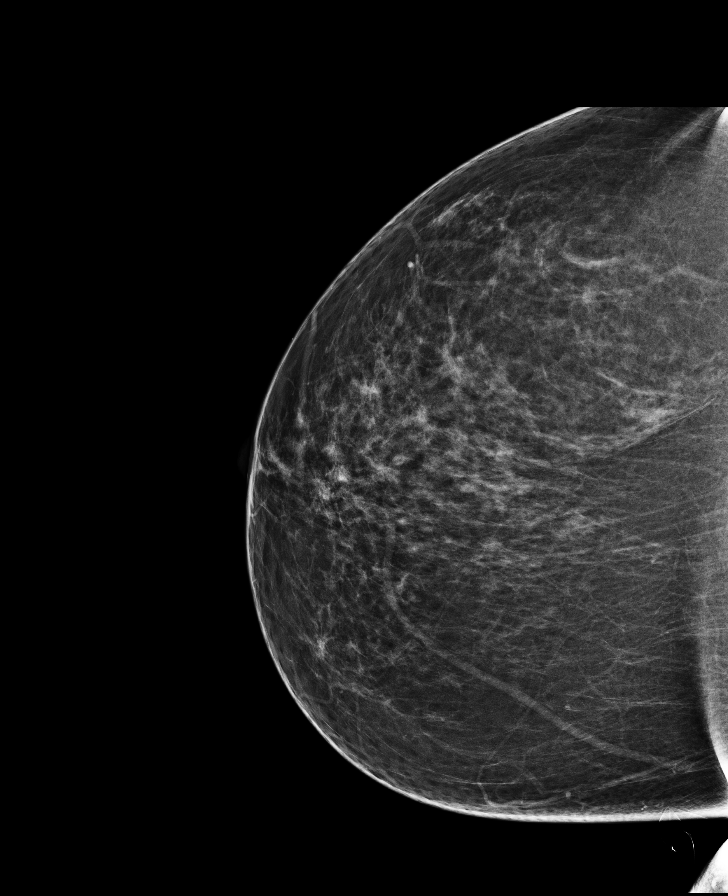

[L CC synth-2D]
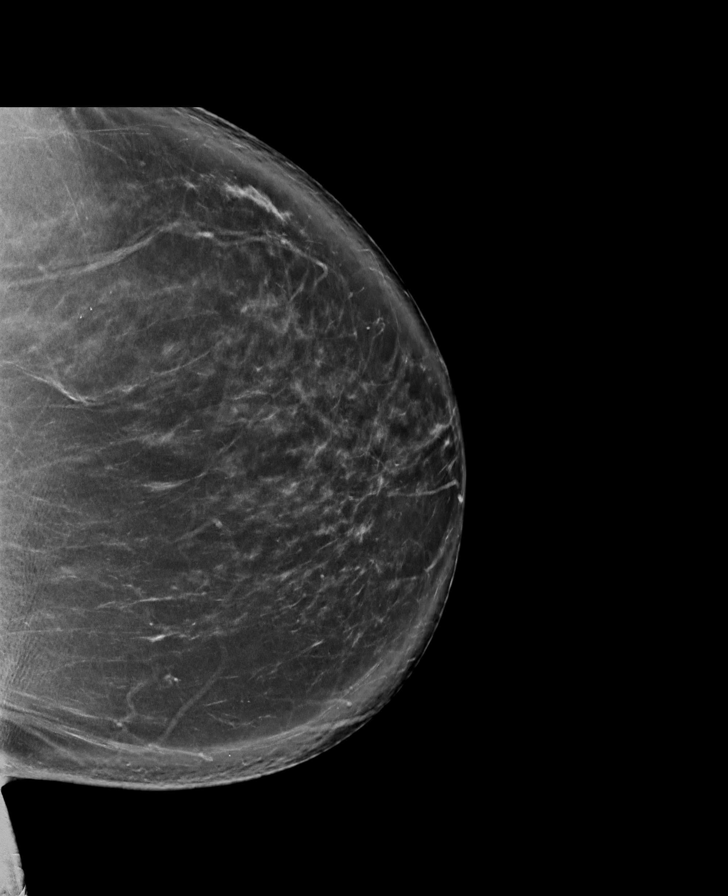

[L MLO]
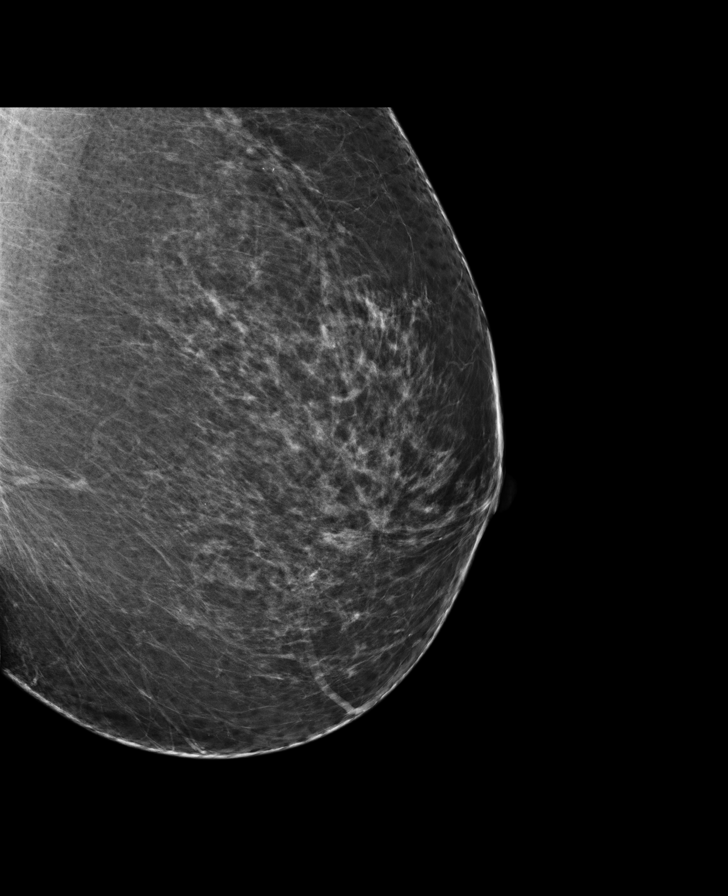

[8 of 29 positions shown; findings below may reference images not displayed]

ACR Breast Density Category b: There are scattered areas of
fibroglandular density.
FINDINGS: There are no findings suspicious for malignancy. Images were
processed with CAD.
IMPRESSION: No mammographic evidence of malignancy. A result letter of this
screening mammogram will be mailed directly to the patient.

RECOMMENDATION:
Screening mammogram in one year. (Code:[4P])

BI-RADS CATEGORY  1: Negative.

## 2018-05-24 ENCOUNTER — Other Ambulatory Visit: Payer: Self-pay | Admitting: Family Medicine

## 2018-05-24 DIAGNOSIS — Z1231 Encounter for screening mammogram for malignant neoplasm of breast: Secondary | ICD-10-CM

## 2018-07-01 ENCOUNTER — Encounter: Payer: Self-pay | Admitting: Obstetrics and Gynecology

## 2018-07-01 ENCOUNTER — Other Ambulatory Visit: Payer: Self-pay

## 2018-07-01 ENCOUNTER — Ambulatory Visit: Payer: BLUE CROSS/BLUE SHIELD | Admitting: Obstetrics and Gynecology

## 2018-07-01 ENCOUNTER — Other Ambulatory Visit (HOSPITAL_COMMUNITY)
Admission: RE | Admit: 2018-07-01 | Discharge: 2018-07-01 | Disposition: A | Discharge status: Home / Self Care | Payer: BLUE CROSS/BLUE SHIELD | Source: Ambulatory Visit | Attending: Obstetrics and Gynecology | Admitting: Obstetrics and Gynecology

## 2018-07-01 VITALS — BP 134/76 | HR 60 | Resp 14 | Ht 65.0 in | Wt 194.0 lb

## 2018-07-01 DIAGNOSIS — Z1211 Encounter for screening for malignant neoplasm of colon: Secondary | ICD-10-CM | POA: Diagnosis not present

## 2018-07-01 DIAGNOSIS — Z124 Encounter for screening for malignant neoplasm of cervix: Secondary | ICD-10-CM | POA: Diagnosis not present

## 2018-07-01 DIAGNOSIS — N950 Postmenopausal bleeding: Secondary | ICD-10-CM

## 2018-07-01 NOTE — Patient Instructions (Addendum)
Postmenopausal Bleeding        Postmenopausal bleeding is any bleeding that a woman has after she has entered into menopause. Menopause is the end of a woman's fertile years. After menopause, a woman no longer ovulates and does not have menstrual periods. Postmenopausal bleeding may have various causes, including:  Menopausal hormone therapy (MHT).  Endometrial atrophy. After menopause, low estrogen hormone levels cause the membrane that lines the uterus (endometrium) to become thinner. You may have bleeding as the endometrium thins.  Endometrial hyperplasia. This condition is caused by excess estrogen hormones and low levels of progesterone hormones. The excess estrogen causes the endometrium to thicken, which can lead to bleeding. In some cases, this can lead to cancer of the uterus.  Endometrial cancer.  Non-cancerous growths (polyps) on the endometrium, the lining of the uterus, or the cervix.  Uterine fibroids. These are non-cancerous growths in or around the uterus muscle tissue that can cause heavy bleeding. Any type of postmenopausal bleeding, even if it appears to be a typical menstrual period, should be evaluated by your health care provider. Treatment will depend on the cause of the bleeding. Follow these instructions at home:  Pay attention to any changes in your symptoms.  Avoid using tampons and douches as told by your health care provider.  Change your pads regularly.  Get regular pelvic exams and Pap tests.  Take iron supplements as told by your health care provider.  Take over-the-counter and prescription medicines only as told by your health care provider.  Keep all follow-up visits as told by your health care provider. This is important. Contact a health care provider if:  Your bleeding lasts more than 1 week.  You have abdominal pain.  You have bleeding with or after sexual intercourse.  You have bleeding that happens more often than every 3  weeks. Get help right away if:  You have a fever, chills, headache, dizziness, muscle aches, and bleeding.  You have severe pain with bleeding.  You are passing blood clots.  You have heavy bleeding, need more than 1 pad an hour, and have never experienced this before.  You feel faint. Summary  Postmenopausal bleeding is any bleeding that a woman has after she has entered into menopause.  Postmenopausal bleeding may have various causes. Treatment will depend on the cause of the bleeding.  Any type of postmenopausal bleeding, even if it appears to be a typical menstrual period, should be evaluated by your health care provider.  Be sure to pay attention to any changes in your symptoms and keep all follow-up visits as told by your health care provider. This information is not intended to replace advice given to you by your health care provider. Make sure you discuss any questions you have with your health care provider. Document Released: 09/13/2005 Document Revised: 08/29/2016 Document Reviewed: 08/29/2016 Elsevier Interactive Patient Education  2019 [place].  Endometrial Biopsy  Endometrial biopsy is a procedure in which a tissue sample is taken from inside the uterus. The sample is taken from the endometrium, which is the lining of the uterus. The tissue sample is then checked under a microscope to see if the tissue is normal or abnormal. This procedure helps to determine where you are in your menstrual cycle and how hormone levels are affecting the lining of the uterus. This procedure may also be used to evaluate uterine bleeding or to diagnose endometrial cancer, endometrial tuberculosis, polyps, or other inflammatory conditions. Tell a health care  provider about:  Any allergies you have.  All medicines you are taking, including vitamins, herbs, eye drops, creams, and over-the-counter medicines.  Any problems you or family members have had with anesthetic medicines.  Any  blood disorders you have.  Any surgeries you have had.  Any medical conditions you have.  Whether you are pregnant or may be pregnant. What are the risks? Generally, this is a safe procedure. However, problems may occur, including:  Bleeding.  Pelvic infection.  Puncture of the wall of the uterus with the biopsy device (rare). What happens before the procedure?  Keep a record of your menstrual cycles as told by your health care provider. You may need to schedule your procedure for a specific time in your cycle.  You may want to bring a sanitary pad to wear after the procedure.  Ask your health care provider about: ? Changing or stopping your regular medicines. This is especially important if you are taking diabetes medicines or blood thinners. ? Taking medicines such as aspirin and ibuprofen. These medicines can thin your blood. Do not take these medicines before your procedure if your health care provider instructs you not to.  Plan to have someone take you home from the hospital or clinic. What happens during the procedure?  To lower your risk of infection: ? Your health care team will wash or sanitize their hands.  You will lie on an exam table with your feet and legs supported as in a pelvic exam.  Your health care provider will insert an instrument (speculum) into your vagina to see your cervix.  Your cervix will be cleansed with an antiseptic solution.  A medicine (local anesthetic) will be used to numb the cervix.  A forceps instrument (tenaculum) will be used to hold your cervix steady for the biopsy.  A thin, rod-like instrument (uterine sound) will be inserted through your cervix to determine the length of your uterus and the location where the biopsy sample will be removed.  A thin, flexible tube (catheter) will be inserted through your cervix and into the uterus. The catheter will be used to collect the biopsy sample from your endometrial tissue.  The catheter  and speculum will then be removed, and the tissue sample will be sent to a lab for examination. What happens after the procedure?  You will rest in a recovery area until you are ready to go home.  You may have mild cramping and a small amount of vaginal bleeding. This is normal.  It is up to you to get the results of your procedure. Ask your health care provider, or the department that is doing the procedure, when your results will be ready. Summary  Endometrial biopsy is a procedure in which a tissue sample is taken from the endometrium, which is the lining of the uterus.  This procedure may help to diagnose menstrual cycle problems, abnormal bleeding, or other conditions affecting the endometrium.  Before the procedure, keep a record of your menstrual cycles as told by your health care provider.  The tissue sample that is removed will be checked under a microscope to see if it is normal or abnormal. This information is not intended to replace advice given to you by your health care provider. Make sure you discuss any questions you have with your health care provider. Document Released: 10/06/2004 Document Revised: 06/21/2016 Document Reviewed: 06/21/2016 Elsevier Interactive Patient Education  2019 [place].     Ultrasound and Endometrial biopsy appointment on 07/04/2018. No  need for full bladder. Eat a good breakfast, water that morning. Tylenol one hour before. You may take two tablets.

## 2018-07-01 NOTE — Progress Notes (Signed)
58 y.o. G68P2002 Married Caucasian female here for postmenopausal bleeding.  Patient began having vaginal bleeding on 06-25-2018 for 5-6 days. She also had very tender breasts during that time and since Christmas.  Felt she had bleeding like a moderate period.  Some cramping.   Seen by Dr. Charlies Constable in 2014 for heavy cycles and was dx with fibroids.  Pelvic US on 02/25/13 showed intramural fibroids.  All 1.19 cm and less.  EMS 3.63 mm.  Normal ovaries and no free fluid.  Thinks she stopped her cycles soon after this No HRT.   Some hot flashes.   Denies dysuria.  PCP:  [facility]  Patient's last menstrual period was 06/19/2012 (approximate).       Sexually active: Yes.     The current method of family planning is tubal/post menopausal status.    Exercising: No.  The patient does not participate in regular exercise at present. Smoker:  no  Health Maintenance: Pap: 01-23-13 Neg:Neg HR HPV History of abnormal Pap:  no MMG: 07-18-17 3D Neg/density B/BiRads1--appt. In Feb. Colonoscopy:  NEVER BMD:   n/a  Result  n/a TDaP: Unsure Gardasil:   no HIV:no Hep C: no Screening Labs:   ---   reports that she has never smoked. She has never used smokeless tobacco. She reports that she does not drink alcohol or use drugs.  Past Medical History:  Diagnosis Date  . Anemia     Past Surgical History:  Procedure Laterality Date  . LIPOMA EXCISION Left 2006   Breast (fatty tissue not cancerous)  . TUBAL LIGATION  1991    Current Outpatient Medications  Medication Sig Dispense Refill  . CALCIUM PO Take by mouth.    . doxylamine, Sleep, (UNISOM) 25 MG tablet Take 25 mg by mouth at bedtime as needed.    . Melatonin 10 MG CAPS Take 1 capsule by mouth at bedtime as needed.    . Multiple Vitamins-Minerals (MULTIVITAMIN PO) Take by mouth.    . Omega-3 Fatty Acids (FISH OIL PO) Take by mouth.    . polyethylene glycol (MIRALAX / GLYCOLAX) packet Take 17 g by mouth daily.     No  current facility-administered medications for this visit.     Family History  Problem Relation Age of Onset  . Heart attack Father   . Cancer Maternal Grandmother   . Cancer Paternal Grandmother     Review of Systems  All other systems reviewed and are negative.   Exam:   BP 134/76 (BP Location: Right Arm, Patient Position: Sitting, Cuff Size: Large)   Pulse 60   Resp 14   Ht 5\' 5"  (1.651 m)   Wt 194 lb (88 kg)   LMP 06/19/2012 (Approximate)   BMI 32.28 kg/m     General appearance: alert, cooperative and appears stated age Head: Normocephalic, without obvious abnormality, atraumatic Neck: no adenopathy, supple, symmetrical, trachea midline and thyroid normal to inspection and palpation Lungs: clear to auscultation bilaterally Breasts: normal appearance, no masses or tenderness, No nipple retraction or dimpling, No nipple discharge or bleeding, No axillary or supraclavicular adenopathy Heart: regular rate and rhythm Abdomen: soft, non-tender; no masses, no organomegaly Extremities: extremities normal, atraumatic, no cyanosis or edema Skin: Skin color, texture, turgor normal. No rashes or lesions Lymph nodes: Cervical, supraclavicular, and axillary nodes normal. No abnormal inguinal nodes palpated Neurologic: Grossly normal  Pelvic: External genitalia:  no lesions              Urethra:  normal appearing urethra with no masses, tenderness or lesions              Bartholins and Skenes: normal                 Vagina: normal appearing vagina with normal color and discharge, no lesions              Cervix: no lesions              Pap taken: Yes.   Bimanual Exam:  Uterus:  normal size, contour, position, consistency, mobility, non-tender              Adnexa: no mass, fullness, tenderness              Rectal exam: Yes.  .  Confirms.              Anus:  normal sphincter tone, no lesions  Chaperone was present for exam.  Assessment:   Postmenopausal bleeding.  Known fibroids.   Colon cancer screening needed.   Plan: Mammogram screening scheduled.  Recommended self breast awareness. Pap and HR HPV as above. Postmenopausal bleeding discussed and potential causes - polyp, fibroids, hyperplasia, malignancy. Return for pelvic US and EMB.  Procedures explained. Referral for colonoscopy.   After visit summary provided.

## 2018-07-02 ENCOUNTER — Telehealth: Payer: Self-pay | Admitting: Obstetrics and Gynecology

## 2018-07-02 NOTE — Telephone Encounter (Signed)
Patient returned call

## 2018-07-02 NOTE — Telephone Encounter (Signed)
Call placed to patient to convey benefits for scheduled ultrasound appointment with endometrial biopsy. Left voicemail message requesting a return call

## 2018-07-03 LAB — CYTOLOGY - PAP
Diagnosis: NEGATIVE
HPV: NOT DETECTED

## 2018-07-04 ENCOUNTER — Ambulatory Visit: Payer: BLUE CROSS/BLUE SHIELD | Admitting: Obstetrics and Gynecology

## 2018-07-04 ENCOUNTER — Ambulatory Visit (INDEPENDENT_AMBULATORY_CARE_PROVIDER_SITE_OTHER): Payer: BLUE CROSS/BLUE SHIELD

## 2018-07-04 DIAGNOSIS — N950 Postmenopausal bleeding: Secondary | ICD-10-CM

## 2018-07-04 DIAGNOSIS — R32 Unspecified urinary incontinence: Secondary | ICD-10-CM | POA: Diagnosis not present

## 2018-07-04 DIAGNOSIS — N8110 Cystocele, unspecified: Secondary | ICD-10-CM | POA: Diagnosis not present

## 2018-07-04 DIAGNOSIS — N83201 Unspecified ovarian cyst, right side: Secondary | ICD-10-CM

## 2018-07-04 DIAGNOSIS — R971 Elevated cancer antigen 125 [CA 125]: Secondary | ICD-10-CM | POA: Diagnosis not present

## 2018-07-04 NOTE — Progress Notes (Signed)
Encounter reviewed by Dr. Brook Amundson C. Silva.  

## 2018-07-04 NOTE — Progress Notes (Signed)
GYNECOLOGY  VISIT   HPI: 58 y.o.   Married  Caucasian  female   G2P2002 with No LMP recorded.   here for   Pelvic US and EMB for postmenopausal bleeding.   Has urinary leakage with cough.  Some urinary frequency.   GYNECOLOGIC HISTORY: No LMP recorded. Contraception:  Post menopausal  Menopausal hormone therapy:  none Last mammogram:  07/18/17 Density B / Bi-rads 1 neg Last pap smear:   07/01/18 - neg, neg HR HPV; 01/23/13 Neg neg HR HPV         OB History    Gravida  2   Para  2   Term  2   Preterm      AB      Living  2     SAB      TAB      Ectopic      Multiple      Live Births                 Patient Active Problem List   Diagnosis Date Noted  . Menorrhagia 01/23/2013  . Acute blood loss anemia 01/23/2013  . Enlarged uterus 01/23/2013  . ABDOMINAL PAIN, ACUTE 04/16/2009    Past Medical History:  Diagnosis Date  . Anemia     Past Surgical History:  Procedure Laterality Date  . LIPOMA EXCISION Left 2006   Breast (fatty tissue not cancerous)  . TUBAL LIGATION  1991    Current Outpatient Medications  Medication Sig Dispense Refill  . CALCIUM PO Take by mouth.    . doxylamine, Sleep, (UNISOM) 25 MG tablet Take 25 mg by mouth at bedtime as needed.    . Melatonin 10 MG CAPS Take 1 capsule by mouth at bedtime as needed.    . Multiple Vitamins-Minerals (MULTIVITAMIN PO) Take by mouth.    . Omega-3 Fatty Acids (FISH OIL PO) Take by mouth.    . polyethylene glycol (MIRALAX / GLYCOLAX) packet Take 17 g by mouth daily.     No current facility-administered medications for this visit.      ALLERGIES: Codeine and Amoxicillin-pot clavulanate  Family History  Problem Relation Age of Onset  . Heart attack Father   . Cancer Maternal Grandmother   . Cancer Paternal Grandmother     Social History   Socioeconomic History  . Marital status: Married    Spouse name: Not on file  . Number of children: Not on file  . Years of education: Not on file   . Highest education level: Not on file  Occupational History  . Not on file  Social Needs  . Financial resource strain: Not on file  . Food insecurity:    Worry: Not on file    Inability: Not on file  . Transportation needs:    Medical: Not on file    Non-medical: Not on file  Tobacco Use  . Smoking status: Never Smoker  . Smokeless tobacco: Never Used  Substance and Sexual Activity  . Alcohol use: No  . Drug use: No  . Sexual activity: Yes    Birth control/protection: Surgical  Lifestyle  . Physical activity:    Days per week: Not on file    Minutes per session: Not on file  . Stress: Not on file  Relationships  . Social connections:    Talks on phone: Not on file    Gets together: Not on file    Attends religious service: Not on file  Active member of club or organization: Not on file    Attends meetings of clubs or organizations: Not on file    Relationship status: Not on file  . Intimate partner violence:    Fear of current or ex partner: Not on file    Emotionally abused: Not on file    Physically abused: Not on file    Forced sexual activity: Not on file  Other Topics Concern  . Not on file  Social History Narrative  . Not on file    Review of Systems  Genitourinary: Positive for vaginal bleeding.    PHYSICAL EXAMINATION:    There were no vitals taken for this visit.    General appearance: alert, cooperative and appears stated age   Pelvic exam -  Cystocele noted - first degree.   Pelvic US Uterus with no masses.  EMS 3.56 mm with sliver of fluid.  Right ovary with 15 mm cyst, avascular, small area of ovarian tissue versus solid area that is not reproducible and my represent ovarian tissue.  Left ovary normal. No free fluid.   EMB: Consent for procedure. Sterile prep with  Hibiclens. Paracervical block with 1% lidocaine 6 cc, lot number [id number], expiration  4/23 Tenaculum to anterior cervical lip. Pipelle passed to   8  cm twice.   Tissue  to pathology.  Silver nitrate to cervix at left paracervical injection site.  Minimal EBL. No complications.   Chaperone was present for exam.  ASSESSMENT  Postmenopausal bleeding.  EMS over 3 mm with fluid in endometrial canal.  Right ovarian echogenic area.  Urinary stress incontinence.  Cystocele.  PLAN  FU EMB.  Instructions and precautions given verbally.  Nothing per vagina until bleeding stops.  Call for fever, increased bleeding, or pain.  CA125.  Final plan to follow.   An After Visit Summary was printed and given to the patient.  __15____ minutes face to face time of which over 50% was spent in counseling.

## 2018-07-05 LAB — CA 125: CANCER ANTIGEN (CA) 125: 12.7 U/mL (ref 0.0–38.1)

## 2018-07-07 ENCOUNTER — Encounter: Payer: Self-pay | Admitting: Obstetrics and Gynecology

## 2018-07-07 DIAGNOSIS — N950 Postmenopausal bleeding: Secondary | ICD-10-CM | POA: Insufficient documentation

## 2018-07-07 DIAGNOSIS — R32 Unspecified urinary incontinence: Secondary | ICD-10-CM | POA: Insufficient documentation

## 2018-07-07 DIAGNOSIS — N8110 Cystocele, unspecified: Secondary | ICD-10-CM | POA: Insufficient documentation

## 2018-07-07 HISTORY — DX: Unspecified urinary incontinence: R32

## 2018-07-10 ENCOUNTER — Telehealth: Payer: Self-pay | Admitting: Emergency Medicine

## 2018-07-10 DIAGNOSIS — N950 Postmenopausal bleeding: Secondary | ICD-10-CM

## 2018-07-10 NOTE — Telephone Encounter (Signed)
-----   Message from Nunzio Cobbs, MD sent at 07/09/2018  2:08 PM EST ----- Please report results of EMS showing benign weakly proliferative endometrium.  This means that the lining of the uterus is showing signs of estrogenic stimulation.  No hyperplasia or malignancy were noted.  I am recommending Provera 10 mg po q day for 12 days per month.  Patient to take for 3 months and then return for a repeat endometrial biopsy.  Please place an order for the EMB and make an appointment for this visit.

## 2018-07-11 MED ORDER — MEDROXYPROGESTERONE ACETATE 10 MG PO TABS: ORAL_TABLET | ORAL | 0 refills | Status: DC

## 2018-07-11 NOTE — Telephone Encounter (Signed)
Notes recorded by Burnice Logan, RN on 07/11/2018 at 9:26 AM EST Left message to call Sharee Pimple, RN at [facility].

## 2018-07-11 NOTE — Telephone Encounter (Signed)
Spoke with patient, advised as seen below per Dr. Quincy Simmonds. Rx for provera to verified pharmacy. EMB scheduled for 4/29 at 3pm with Dr. Quincy Simmonds, order placed for precert.  Patient verbalizes understanding and is agreeable.   Encounter closed.

## 2018-07-22 ENCOUNTER — Ambulatory Visit
Admission: RE | Admit: 2018-07-22 | Discharge: 2018-07-22 | Disposition: A | Discharge status: Home / Self Care | Payer: BLUE CROSS/BLUE SHIELD | Source: Ambulatory Visit | Attending: Family Medicine | Admitting: Family Medicine

## 2018-07-22 ENCOUNTER — Ambulatory Visit: Payer: BLUE CROSS/BLUE SHIELD

## 2018-07-22 DIAGNOSIS — Z1231 Encounter for screening mammogram for malignant neoplasm of breast: Secondary | ICD-10-CM

## 2018-10-10 ENCOUNTER — Telehealth: Payer: Self-pay | Admitting: Obstetrics and Gynecology

## 2018-10-10 NOTE — Telephone Encounter (Signed)
Routing to Dr. Quincy Simmonds to review and advise.

## 2018-10-10 NOTE — Telephone Encounter (Signed)
Patient canceled her 3 month EMB follow up 10/16/18 with Dr.Silva. This appointment was scheduled in the procedure room also. She has many questions about if she still needs this appointment. She asked if she needs to keep this appointment, how far out can she reschedule due to taking this medication?

## 2018-10-11 NOTE — Telephone Encounter (Signed)
Left message to call Gaynor Ferreras, RN at [facility] [phone].   

## 2018-10-11 NOTE — Telephone Encounter (Signed)
I recommend she take Provera 10 mg x 12 days for one more month.  Then she will need to return to do the endometrial biopsy.  I do not recommend she put this off further than one month.  Please reschedule.

## 2018-10-15 NOTE — Telephone Encounter (Signed)
Patient is returning call to triage nurse.

## 2018-10-15 NOTE — Telephone Encounter (Signed)
Left message to call Wenceslao Loper at [phone]. 

## 2018-10-16 ENCOUNTER — Ambulatory Visit: Payer: Self-pay | Admitting: Obstetrics and Gynecology

## 2018-10-30 NOTE — Telephone Encounter (Signed)
Left message to call Alaa Mullally at [phone]. 

## 2018-11-12 NOTE — Telephone Encounter (Signed)
Left detailed message, ok per dpr, name identified on voicemail. Advised calling in f/u to provera Rx and rescheduling EMB. Please return call to [place], [place] at [facility].

## 2018-11-14 ENCOUNTER — Encounter: Payer: Self-pay | Admitting: Obstetrics and Gynecology

## 2018-11-14 NOTE — Telephone Encounter (Signed)
Patient has not returned call x3. Left detailed message, routing to Dr. Quincy Simmonds to advise.

## 2018-11-14 NOTE — Telephone Encounter (Signed)
Patient needs letter recommending she repeat her endometrial biopsy due to proliferative change and postmenopausal bleeding.  She has been in treatment for this using cyclic progesterone. I have already extended her treatment one month.

## 2018-11-25 NOTE — Telephone Encounter (Signed)
Dr. Quincy Simmonds -standard letter or 30 day letter?

## 2018-11-25 NOTE — Telephone Encounter (Signed)
Please send standard letter to start with.

## 2018-11-25 NOTE — Telephone Encounter (Signed)
Standard letter printed and placed in [place] [employer].   Routing to Dr. Quincy Simmonds.   Encounter closed.

## 2018-12-02 ENCOUNTER — Telehealth: Payer: Self-pay | Admitting: *Deleted

## 2018-12-02 NOTE — Telephone Encounter (Signed)
Patient returned call to office to schedule EMB. Patient has taken Provera x3, no bleeding, asking if EMB still recommended? Advised EMB still recommended in f/u to PMB. EMB scheduled for 12/16/18 at 4:30pm with Dr. Quincy Simmonds. Advised to take Motrin 800 mg with food and water one hour before procedure. Order previously placed for precert. Patient agreeable to date and time.   Routing to provider for final review. Patient is agreeable to disposition. Will close encounter.  Cc: Lerry Liner

## 2018-12-05 ENCOUNTER — Telehealth: Payer: Self-pay | Admitting: Obstetrics and Gynecology

## 2018-12-05 NOTE — Telephone Encounter (Signed)
Left message to call Jonathan Kirkendoll, RN at [facility] [phone].   

## 2018-12-05 NOTE — Telephone Encounter (Signed)
Please contact patient to reschedule her appointment to a time slot that is not an end of the day appointment.

## 2018-12-06 NOTE — Telephone Encounter (Signed)
Spoke with patient. 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 EMB rescheduled to 7/16 at 11:30am with Dr. Quincy Simmonds.   Routing to provider for final review. Patient is agreeable to disposition. Will close encounter.

## 2018-12-16 ENCOUNTER — Ambulatory Visit: Payer: Self-pay | Admitting: Obstetrics and Gynecology

## 2019-01-02 ENCOUNTER — Encounter: Payer: Self-pay | Admitting: Obstetrics and Gynecology

## 2019-01-02 ENCOUNTER — Other Ambulatory Visit: Payer: Self-pay

## 2019-01-02 ENCOUNTER — Ambulatory Visit: Payer: Self-pay

## 2019-01-02 ENCOUNTER — Ambulatory Visit: Payer: BC Managed Care – PPO | Admitting: Obstetrics and Gynecology

## 2019-01-02 DIAGNOSIS — N950 Postmenopausal bleeding: Secondary | ICD-10-CM

## 2019-01-02 NOTE — Patient Instructions (Signed)

## 2019-01-02 NOTE — Progress Notes (Signed)
GYNECOLOGY  VISIT   HPI: 58 y.o.   Married  Caucasian  female   G2P2002 with Patient's last menstrual period was 01/03/2013.   here for EMB. Patient states that she has not had any bleeding since completing Provera.     Patient had postmenopausal bleeding and had pelvic US showing EMS 3.56 mm and fluid in the endometrial canal.   Her right ovary contained a 15 mm cyst, avascular with a small area of ovarian tissue versus solid area that was not reproducible. EMB performed and weak proliferation was noted.   CA125 12.7.  She was treated with Provera 10 mg x 12 days per month x 3 months.  She did have some mild bleeding with the first course of the Provera.   GYNECOLOGIC HISTORY: Patient's last menstrual period was 01/03/2013. Contraception:  Postmenopausal Menopausal hormone therapy:  none Last mammogram:  07/18/17 BIRADS 1 negative/density b Last pap smear:   07/01/18 Neg:Neg HR HPV        OB History    Gravida  2   Para  2   Term  2   Preterm      AB      Living  2     SAB      TAB      Ectopic      Multiple      Live Births                 Patient Active Problem List   Diagnosis Date Noted  . Postmenopausal bleeding 07/07/2018  . Female bladder prolapse 07/07/2018  . Urinary incontinence 07/07/2018  . Menorrhagia 01/23/2013  . Acute blood loss anemia 01/23/2013  . Enlarged uterus 01/23/2013  . ABDOMINAL PAIN, ACUTE 04/16/2009    Past Medical History:  Diagnosis Date  . Anemia   . Urinary incontinence 07/07/2018    Past Surgical History:  Procedure Laterality Date  . LIPOMA EXCISION Left 2006   Breast (fatty tissue not cancerous)  . TUBAL LIGATION  1991    Current Outpatient Medications  Medication Sig Dispense Refill  . CALCIUM PO Take by mouth.    . doxylamine, Sleep, (UNISOM) 25 MG tablet Take 25 mg by mouth at bedtime as needed.    . Melatonin 10 MG CAPS Take 1 capsule by mouth at bedtime as needed.    . Multiple Vitamins-Minerals  (MULTIVITAMIN PO) Take by mouth.    . Omega-3 Fatty Acids (FISH OIL PO) Take by mouth.    . polyethylene glycol (MIRALAX / GLYCOLAX) packet Take 17 g by mouth daily.     No current facility-administered medications for this visit.      ALLERGIES: Codeine and Amoxicillin-pot clavulanate  Family History  Problem Relation Age of Onset  . Heart attack Father   . Cancer Maternal Grandmother   . Cancer Paternal Grandmother     Social History   Socioeconomic History  . Marital status: Married    Spouse name: Not on file  . Number of children: Not on file  . Years of education: Not on file  . Highest education level: Not on file  Occupational History  . Not on file  Social Needs  . Financial resource strain: Not on file  . Food insecurity    Worry: Not on file    Inability: Not on file  . Transportation needs    Medical: Not on file    Non-medical: Not on file  Tobacco Use  . Smoking status: Never Smoker  .  Smokeless tobacco: Never Used  Substance and Sexual Activity  . Alcohol use: No  . Drug use: No  . Sexual activity: Yes    Birth control/protection: Surgical  Lifestyle  . Physical activity    Days per week: Not on file    Minutes per session: Not on file  . Stress: Not on file  Relationships  . Social Herbalist on phone: Not on file    Gets together: Not on file    Attends religious service: Not on file    Active member of club or organization: Not on file    Attends meetings of clubs or organizations: Not on file    Relationship status: Not on file  . Intimate partner violence    Fear of current or ex partner: Not on file    Emotionally abused: Not on file    Physically abused: Not on file    Forced sexual activity: Not on file  Other Topics Concern  . Not on file  Social History Narrative  . Not on file    Review of Systems  Constitutional: Negative.   HENT: Negative.   Eyes: Negative.   Respiratory: Negative.   Cardiovascular: Negative.    Gastrointestinal: Negative.   Endocrine: Negative.   Genitourinary: Negative.   Musculoskeletal: Negative.   Skin: Negative.   Allergic/Immunologic: Negative.   Neurological: Negative.   Hematological: Negative.   Psychiatric/Behavioral: Negative.     PHYSICAL EXAMINATION:    BP 132/70 (BP Location: Right Arm, Patient Position: Sitting, Cuff Size: Normal)   Pulse 76   Temp (!) 97.3 F (36.3 C) (Temporal)   Resp 12   Ht 5' 4.5" (1.638 m)   Wt 201 lb (91.2 kg)   LMP 01/03/2013   BMI 33.97 kg/m     General appearance: alert, cooperative and appears stated age   Pelvic: External genitalia:  no lesions              Urethra:  normal appearing urethra with no masses, tenderness or lesions              Bartholins and Skenes: normal                 Vagina: normal appearing vagina with normal color and discharge, no lesions              Cervix: no lesions                Bimanual Exam:  Uterus:  normal size, contour, position, consistency, mobility, non-tender              Adnexa: no mass, fullness, tenderness           EMB Consent for procedure.  Sterile prep with Hibiclens. Paracervical block with 1% lidocaine - lot [id number], exp 12/18/19. EMB to 7 cm.  Passed Pipelle x 2. Tissue to pathology. Minor vasovagal response afterward.  Patient observed and stable.  Chaperone was present for exam.  ASSESSMENT  Hx postmenopausal bleeding.  Fluid in endometrial canal on prior imaging. Prior EMB showing proliferative endometrium without hyperplasia or malignancy. Status post cyclic Provera x 3 cycles.   PLAN  FU EMB. Instructions and precautions given.  Our goal is to see the resolution of endometrial proliferation.  Final plan to follow results.   An After Visit Summary was printed and given to the patient.  __15____ minutes face to face time of which over 50% was spent in counseling.

## 2019-01-06 ENCOUNTER — Telehealth: Payer: Self-pay | Admitting: *Deleted

## 2019-01-06 DIAGNOSIS — N950 Postmenopausal bleeding: Secondary | ICD-10-CM

## 2019-01-06 NOTE — Telephone Encounter (Signed)
Notes recorded by Burnice Logan, RN on 01/06/2019 at 3:48 PM EDT  Call to patient, no answer, no option to leave voicemail.

## 2019-01-06 NOTE — Telephone Encounter (Signed)
-----   Message from Nunzio Cobbs, MD sent at 01/06/2019 12:52 PM EDT ----- Please contact patient with results of testing.  She continues to have endometrial proliferation.  No hyperplasia or malignancy were noted.  I am recommending Provera 5 mg daily and then a repeat EMB in 3 months with me. Please schedule the follow up EMB.

## 2019-01-16 NOTE — Telephone Encounter (Signed)
Call placed to mobile and home numbers. Left message to call Sharee Pimple, RN at [place].

## 2019-01-17 NOTE — Telephone Encounter (Signed)
Patient is returning a call to [place]. Please call cell [phone].

## 2019-01-17 NOTE — Telephone Encounter (Signed)
Spoke with patient and notified her of endometrial biopsy results per Dr.Silva's note below. Advised patient needs to begin Provera 5mg  daily--need to verify with Dr.Silva if this is every day for 3 months  And will call her back. Made 3 month follow up EMB for 04-04-19 at 11:00am.  Order placed for EMB and precert sent to [facility].

## 2019-01-20 MED ORDER — MEDROXYPROGESTERONE ACETATE 5 MG PO TABS: 5.0000 mg | ORAL_TABLET | Freq: Every day | ORAL | 2 refills | Status: AC

## 2019-01-20 NOTE — Telephone Encounter (Signed)
Called patient and per DPR, left detailed message stating Dr.Silva wants her taking Provera 5mg  daily until ner 61month follow up EMB. Rx sent to pharmacy on file. Advised to return call if had any questions.

## 2019-02-21 ENCOUNTER — Telehealth: Payer: Self-pay | Admitting: Obstetrics and Gynecology

## 2019-02-21 NOTE — Telephone Encounter (Signed)
Left message on voicemail for patient to reschedule EMB from 04/04/19. Provider is out of office.

## 2019-02-25 NOTE — Telephone Encounter (Signed)
Patient returning call to reschedule EMB procedure.

## 2019-02-25 NOTE — Telephone Encounter (Signed)
Spoke with patient. EMB rescheduled to 10/9 at 12:30pm with Dr. Quincy Simmonds.   Routing to provider for final review. Patient is agreeable to disposition. Will close encounter.

## 2019-03-25 ENCOUNTER — Telehealth: Payer: Self-pay | Admitting: Obstetrics and Gynecology

## 2019-03-25 NOTE — Telephone Encounter (Signed)
Call placed to convey benefits for endometrial biopsy. Left message requesting a return call to [phone].

## 2019-03-28 ENCOUNTER — Other Ambulatory Visit: Payer: Self-pay

## 2019-03-28 ENCOUNTER — Other Ambulatory Visit: Payer: Self-pay | Admitting: Obstetrics and Gynecology

## 2019-03-28 ENCOUNTER — Ambulatory Visit: Payer: BC Managed Care – PPO | Admitting: Obstetrics and Gynecology

## 2019-03-28 ENCOUNTER — Encounter: Payer: Self-pay | Admitting: Obstetrics and Gynecology

## 2019-03-28 VITALS — BP 140/80 | HR 60 | Temp 97.4°F | Ht 64.5 in | Wt 199.6 lb

## 2019-03-28 DIAGNOSIS — N950 Postmenopausal bleeding: Secondary | ICD-10-CM | POA: Diagnosis not present

## 2019-03-28 NOTE — Progress Notes (Signed)
GYNECOLOGY  VISIT   HPI: 58 y.o.   Married  Caucasian  female   G2P2002 with Patient's last menstrual period was 01/03/2013.   here for EMB     Patient has had postmenopausal bleeding and ultrasound showed EMB 3.56 with fluid in the canal.   She has been treated with Provera 5 mg daily for endometrial biopsy showing proliferative endometrium.  She started in August and has enough to do take throughout the rest of the month. She had previously been treated with cyclic Provera and her proliferation of the endometrium persisted.  She denies any vaginal bleeding.   Her pelvic US also showed a 15 mm cyst of the right ovary with a small solid area in the past.  This was not reproducible on follow up ultrasound.  Her CA125 was measured at 12.7.  She took 1500 mg Tylenol one hour ago.  GYNECOLOGIC HISTORY: Patient's last menstrual period was 01/03/2013. Contraception:  Postmenopausal Menopausal hormone therapy:  none Last mammogram:  07/22/18 BIRADS 1 negative/density b Last pap smear:   07/01/18 Neg:neg HR HPV        OB History    Gravida  2   Para  2   Term  2   Preterm      AB      Living  2     SAB      TAB      Ectopic      Multiple      Live Births                 Patient Active Problem List   Diagnosis Date Noted  . Postmenopausal bleeding 07/07/2018  . Female bladder prolapse 07/07/2018  . Urinary incontinence 07/07/2018  . Menorrhagia 01/23/2013  . Acute blood loss anemia 01/23/2013  . Enlarged uterus 01/23/2013  . ABDOMINAL PAIN, ACUTE 04/16/2009    Past Medical History:  Diagnosis Date  . Anemia   . Urinary incontinence 07/07/2018    Past Surgical History:  Procedure Laterality Date  . LIPOMA EXCISION Left 2006   Breast (fatty tissue not cancerous)  . TUBAL LIGATION  1991    Current Outpatient Medications  Medication Sig Dispense Refill  . CALCIUM PO Take by mouth.    . doxylamine, Sleep, (UNISOM) 25 MG tablet Take 25 mg by mouth at  bedtime as needed.    . medroxyPROGESTERone (PROVERA) 5 MG tablet Take 1 tablet (5 mg total) by mouth daily. 30 tablet 2  . Melatonin 10 MG CAPS Take 1 capsule by mouth at bedtime as needed.    . Multiple Vitamins-Minerals (MULTIVITAMIN PO) Take by mouth.    . Omega-3 Fatty Acids (FISH OIL PO) Take by mouth.    . polyethylene glycol (MIRALAX / GLYCOLAX) packet Take 17 g by mouth daily.     No current facility-administered medications for this visit.      ALLERGIES: Codeine and Amoxicillin-pot clavulanate  Family History  Problem Relation Age of Onset  . Heart attack Father   . Cancer Maternal Grandmother   . Cancer Paternal Grandmother     Social History   Socioeconomic History  . Marital status: Married    Spouse name: Not on file  . Number of children: Not on file  . Years of education: Not on file  . Highest education level: Not on file  Occupational History  . Not on file  Social Needs  . Financial resource strain: Not on file  . Food insecurity  Worry: Not on file    Inability: Not on file  . Transportation needs    Medical: Not on file    Non-medical: Not on file  Tobacco Use  . Smoking status: Never Smoker  . Smokeless tobacco: Never Used  Substance and Sexual Activity  . Alcohol use: No  . Drug use: No  . Sexual activity: Yes    Birth control/protection: Surgical  Lifestyle  . Physical activity    Days per week: Not on file    Minutes per session: Not on file  . Stress: Not on file  Relationships  . Social Herbalist on phone: Not on file    Gets together: Not on file    Attends religious service: Not on file    Active member of club or organization: Not on file    Attends meetings of clubs or organizations: Not on file    Relationship status: Not on file  . Intimate partner violence    Fear of current or ex partner: Not on file    Emotionally abused: Not on file    Physically abused: Not on file    Forced sexual activity: Not on file   Other Topics Concern  . Not on file  Social History Narrative  . Not on file    Review of Systems  PHYSICAL EXAMINATION:    BP 140/80   Pulse 60   Temp (!) 97.4 F (36.3 C) (Temporal)   Ht 5' 4.5" (1.638 m)   Wt 199 lb 9.6 oz (90.5 kg)   LMP 01/03/2013   BMI 33.73 kg/m     General appearance: alert, cooperative and appears stated age   Pelvic: External genitalia:  no lesions              Urethra:  normal appearing urethra with no masses, tenderness or lesions              Bartholins and Skenes: normal                 Vagina: normal appearing vagina with normal color and discharge, no lesions              Cervix: no lesions                Bimanual Exam:  Uterus:  normal size, contour, position, consistency, mobility, non-tender              Adnexa: no mass, fullness, tenderness    EMB: Consent for procedure. Sterile prep with   Paracervical block with 1% lidocaine 6 cc, lot number    [id number]   , expiration April 2022 Tenaculum to anterior cervical lip. Pipelle passed to    7      cm twice.   Tissue to pathology.  Minimal EBL. No complications.   Chaperone was present for exam.  ASSESSMENT  Postmenopausal bleeding.  Fluid in endometrial canal.  Proliferative endometrium persisting after cyclic Provera.  Now on continuous Provera.   PLAN  FU EMB.  Post biopsy precautions given.  Ok to continue Provera while results are pending.  Final plan to follow.   An After Visit Summary was printed and given to the patient.  ___15___ minutes face to face time of which over 50% was spent in counseling.

## 2019-03-28 NOTE — Patient Instructions (Signed)

## 2019-04-03 ENCOUNTER — Telehealth: Payer: Self-pay | Admitting: Obstetrics and Gynecology

## 2019-04-03 NOTE — Telephone Encounter (Signed)
Patient was told to discontinue her Provera after her EMB results. She asked if she needs to finish her current prescription?

## 2019-04-03 NOTE — Telephone Encounter (Signed)
Spoke with patient. Reviewed results as seen below per Dr. Quincy Simmonds. Questions answered. Patient verbalizes understanding and is agreeable.    Notes recorded by Nunzio Cobbs, MD on 04/02/2019 at 12:40 PM EDT  Please let patient know that her endometrial biopsy shows inactive endometrium.  There is no sign of proliferation of abnormal cells.  She can discontinue the progesterone medication.  Please have her call me for any future vaginal bleeding.   I am highlighting this result so you know to contact the patient.    Encounter closed.

## 2019-04-04 ENCOUNTER — Ambulatory Visit: Payer: BC Managed Care – PPO | Admitting: Obstetrics and Gynecology

## 2019-05-12 ENCOUNTER — Other Ambulatory Visit: Payer: Self-pay | Admitting: Obstetrics and Gynecology

## 2019-05-12 DIAGNOSIS — Z1231 Encounter for screening mammogram for malignant neoplasm of breast: Secondary | ICD-10-CM

## 2019-07-24 ENCOUNTER — Ambulatory Visit: Payer: BLUE CROSS/BLUE SHIELD

## 2019-07-24 ENCOUNTER — Ambulatory Visit
Admission: RE | Admit: 2019-07-24 | Discharge: 2019-07-24 | Disposition: A | Discharge status: Home / Self Care | Payer: BLUE CROSS/BLUE SHIELD | Source: Ambulatory Visit | Attending: Obstetrics and Gynecology | Admitting: Obstetrics and Gynecology

## 2019-07-24 ENCOUNTER — Other Ambulatory Visit: Payer: Self-pay

## 2019-07-24 DIAGNOSIS — Z1231 Encounter for screening mammogram for malignant neoplasm of breast: Secondary | ICD-10-CM

## 2020-06-01 ENCOUNTER — Other Ambulatory Visit: Payer: Self-pay | Admitting: Obstetrics and Gynecology

## 2020-06-01 DIAGNOSIS — Z1231 Encounter for screening mammogram for malignant neoplasm of breast: Secondary | ICD-10-CM

## 2020-06-08 ENCOUNTER — Telehealth

## 2020-06-08 NOTE — Telephone Encounter
Hello,    The patient is scheduled for 06/21/2020, and has no insurance on file. I lvm for the patient at [phone]. Pt will be listed as self-pay for now.    Please be advised at this time the patient is not clear for this appointment.    Thank you,  Fredrich Birks  Patient Communication Representative  Office: [phone] ~ Fax: [phone]

## 2020-06-17 ENCOUNTER — Telehealth: Payer: MEDICAID

## 2020-06-17 NOTE — Telephone Encounter
Call Back Request      Reason for call back: Pt had to change Appt to Video Visit do to Symptoms. Per pt would like to know If paperwork can be sent to her Pt appt @ 8:15 am.  Please    Any Symptoms:  [x]  Yes  []  No       If yes, what symptoms are you experiencing: neck pain    o Duration of symptoms (how long):  Sept  o Have you taken medication for symptoms (OTC or Rx):      Patient or caller has been notified of the 24-48 hour turnaround time.

## 2020-06-21 ENCOUNTER — Ambulatory Visit: Payer: MEDICARE

## 2020-06-21 ENCOUNTER — Ambulatory Visit: Payer: MEDICAID

## 2020-06-21 ENCOUNTER — Telehealth: Payer: MEDICAID

## 2020-06-21 DIAGNOSIS — G992 Myelopathy in diseases classified elsewhere: Secondary | ICD-10-CM

## 2020-06-21 DIAGNOSIS — I10 Essential (primary) hypertension: Secondary | ICD-10-CM

## 2020-06-21 DIAGNOSIS — G4733 Obstructive sleep apnea (adult) (pediatric): Secondary | ICD-10-CM

## 2020-06-21 DIAGNOSIS — M4802 Spinal stenosis, cervical region: Secondary | ICD-10-CM

## 2020-06-21 DIAGNOSIS — R9439 Abnormal result of other cardiovascular function study: Secondary | ICD-10-CM

## 2020-06-21 DIAGNOSIS — E890 Postprocedural hypothyroidism: Secondary | ICD-10-CM

## 2020-06-21 DIAGNOSIS — Z114 Encounter for screening for human immunodeficiency virus [HIV]: Secondary | ICD-10-CM

## 2020-06-21 DIAGNOSIS — E119 Type 2 diabetes mellitus without complications: Secondary | ICD-10-CM

## 2020-06-21 DIAGNOSIS — Z7689 Persons encountering health services in other specified circumstances: Secondary | ICD-10-CM

## 2020-06-21 DIAGNOSIS — Z1159 Encounter for screening for other viral diseases: Secondary | ICD-10-CM

## 2020-06-21 MED ORDER — ATORVASTATIN CALCIUM 40 MG PO TABS
40 mg | ORAL_TABLET | Freq: Every day | ORAL | 3 refills | Status: AC
Start: 2020-06-21 — End: 2020-07-02

## 2020-06-21 NOTE — Assessment & Plan Note
06/21/2020 Chronic, stable. S/p RAI in 30's. Euthyroid on labs from 12/08/19. C/w Levothyroxine 100 mcg every day. Check TSH.

## 2020-06-21 NOTE — Progress Notes
PATIENT: Chelsea [name]  MRN: [id number]  DOB: July 20, 1960  DATE OF SERVICE: 06/21/2020    CHIEF COMPLAINT:   Chief Complaint   Patient presents with   ??? Cough   ??? Sore Throat      Outpatient Clinic Video Visit     Patient Consent to Telehealth Questionnaire   [facility] TELEHEALTH PRECHECKIN QUESTIONS 06/21/2020   By clicking ''I Agree'', I consent to the below:  I Agree     - I agree  to be treated via a video visit and acknowledge that I may be liable for any relevant copays or coinsurance depending on my insurance plan.  - I understand that this video visit is offered for my convenience and I am able to cancel and reschedule for an in-person appointment if I desire.  - I also acknowledge that sensitive medical information may be discussed during this video visit appointment and that it is my responsibility to locate myself in a location that ensures privacy to my own level of comfort.  - I also acknowledge that I should not be participating in a video visit in a way that could cause danger to myself or to those around me (such as driving or walking).  If my provider is concerned about my safety, I understand that they have the right to terminate the visit.         Patient: Chelsea [name]  MRN: [id number]  PCP: Clint Lipps, DO  DATE: 06/21/2020    Video visit is being conducted today instead of an in-person visit due to COVID-19.         HPI   Lauralei Clouse is a 60 y.o. female who  has a past medical history of Abnormal cardiovascular stress test (06/21/2020), Diabetes mellitus (HCC/RAF) (02/20/2017), Hypertension, essential (06/21/2020), Hypothyroidism (06/21/2020), Myelopathy concurrent with and due to spinal stenosis of cervical region (HCC/RAF) (06/21/2020), and OSA (obstructive sleep apnea) (06/21/2020). who presents for   Chief Complaint   Patient presents with   ??? Cough   ??? Sore Throat       Dr. Ricki Miller, Greetings and Happy New Year to you and your families and your staff,   I???d like to outline my pain in  the virtual  pt appt .  I requested the virtual due to my experiencing congestion and stuffiness  that was associated with a sore throat and a painful cough that comes and goes since around Dec.  26, 2021.  My chief concern. I am experiencing extreme neck pain which I manage  with Gabapentin 300 mg and 100 mg that I take between the 300 mg to manage the pain.  *I suffered a forehead injury on August 25, 2019 While boarding an [employer].  I hit my forehead at the top  entrance doorway roof top frame of the vehicle. Later,   I was diagnosed with a concussion and as a result of that injury  I have severe neck pain and was told I need surgery due to the narrowing of the space for my  spinal cord in neck. I have pain in both shoulders and both hands are numb and I can???t open simple jars. My hands won???t grip or it???s pain in my wrist and my shoulders to do simple tasks.  I use a neck brace when needed to control the neck pain  when I bend my neck.    Also, I experiencing very dry cracked hands and a few of my fingers bleed when I bend them.  And my nails look like nail  fungus and one finger has a reoccurring painful blister.  Also,  a very painful pimple in my left ear.  I???d like to upload a few photos for your review following this msg. Your time is valued and appreciated. Im looking forward to meeting you and having you as my doctor.     #Neck Pain  Onset since forehead injury 08/25/2019. Pain at neck with radiation down right arm and BL shoulders. Sleeps in neck brace. Lying supine worsens the pain. Associated muscle weakness and numbness in BL hands - can't open jars or buckle seat belts. Currently taking Gabapentin, which helps make the pain the manageable. Was originally planning on surgery but then had abnormal stress test as part of preop exam as detailed below.      #Abnormal Cardiac Stress Test  Had abnormal stress as part of preop exam with cardiology showing no ischemic EKG changes but e/o large anterior wall ischemia. Recommended for angiogram and starting ASA 81 mg every day. She was seen by a second cardiologist who repeated an exercise nuclear stress test with no evidence of ischemia and recommended stopping aspirin and no need for angiogram. She would like a second opinion at [facility]. Denies CP, SOB, orthopnea. Has mild BL LE edema.     #OSA  Remains on CPAP qHS    #Hypothyroidism  S/p RAI in 60's. Currently on Levothyroxine 100 mcg every day. TSH 2.32 on 12/08/19.    #DM2  Diagnosed with HgA1c 9%. Currently on MFM 500 mg BID. Last HgA1c 6.6% on 06/03/19.    #HTN  Benicar 40/25 mg x1/2 tablet every day    Currently having nasal congestion, sore throat and cough since Christmas.         Patient Care Team:  Clint Lipps, DO as PCP - General (Family Medicine)    MEDS     Outpatient Medications Prior to Visit   Medication Sig   ??? gabapentin 100 mg capsule    ??? gabapentin 300 mg capsule take 1 capsule by mouth five times a day   ??? levothyroxine 100 mcg tablet take 1 tablet by mouth every morning before breakfast   ??? metFORMIN 500 mg tablet Take 500 mg by mouth two (2) times daily with meals.   ??? olmesartan-hydroCHLOROthiazide 40-25 mg tablet Take 0.5 tablets by mouth daily.     No facility-administered medications prior to visit.       PHYSICAL EXAM      Last Recorded Vital Signs:     Body mass index is 47.55 kg/m???.  Wt Readings from Last 3 Encounters:   06/21/20 (!) 260 lb (117.9 kg)      Gen - Awake. NAD.    Pulm - Nml effort. Speaking in full sentences. No e/o respiratory distress.  Derm - Nml appearance and color.  Neuro - AAOx3.   Psych - Nml mood and affect. No tangential thinking or pressured speech.       LABS/STUDIES   I have:   []  Reviewed/ordered []  1 []  2 []  ? 3 unique laboratory, radiology, and/or diagnostic tests noted below    []  Reviewed []  1 []  2 []  ? 3 prior external notes and incorporated into patient assessment    Neurosurg Dr. Rolena Infante 05/10/20  Cardiology Dr. Steva Ready 04/09/20  Neurology Dr. Tona Sensing 03/11/20  []  Discussed management or test interpretation with external provider(s) as noted       Lab Studies:  As detailed in HPI  Imaging Studies:   NM Nuclear Stress Test (04/05/20):  Normal exercise stress myocardial perfusion imaging. ???   The patient had no symptoms or EKG changes during exercise achieving 102%   of MPHR.   Gated SPECT imaging revealed normal myocardial perfusion and function with   no evidence of inducible ischemia.     TTE (01/28/20):  ??? ???Fair quality study   ??? ???Mild concentric left ventricular hypertrophy with normal systolic   function   ??? ???Normal diastolic function   ??? ???Mild left atrial enlargement   ??? ???Trace tricuspid regurgitation and normal pulmonary pressures      MRI Cervical Spine (09/30/19)  IMPRESSION -     No significant change.     1. ???Multilevel degenerative changes most pronounced at C5-C6 where there is trace   retrolisthesis with a central disk/osteophyte protrusion impinging the spinal cord with   severe spinal canal stenosis. There is mild facet arthropathy and bilateral   uncovertebral hypertrophy causing severe bilateral foraminal stenosis. This affects the exiting   bilateral C6 nerve roots. This level is unchanged.     2. Reversal of the cervical lordosis suggesting muscle spasm and/or degenerative   changes.       Mammo Screening (04/02/19):  IMPRESSION:     RIGHT AND LEFT BREASTS: Benign-appearing nodule(s) without interval development   of mammographic evidence of malignancy or significant change since available   comparison imaging.     RECOMMENDATIONS:     1. Return to surveillance mammography in 12 months.   2. Physical examination of the patient's breasts is recommended, if not   previously performed. A negative or benign imaging examination should not deter   or delay biopsy for any clinically suspicious abnormality.     FINAL ASSESSMENT: BI-RADS Category 2 - benign finding(s).         A&P   Porshae Sinibaldi is a 60 y.o. female presenting for   Chief Complaint   Patient presents with   ??? Cough   ??? Sore Throat PROBLEM & ORDERS    ICD-10-CM    1. Encounter to establish care  Z76.89    2. Abnormal cardiovascular stress test  R94.39 Referral to Cardiology   3. Myelopathy concurrent with and due to spinal stenosis of cervical region (HCC/RAF)  M48.02 Referral to [facility] Neurological Surgery    G99.2    4. Screening for HIV (human immunodeficiency virus)  Z11.4 HIV-1/2 Ag/Ab 4th Generation with Reflex Confirmation   5. Need for hepatitis C screening test  Z11.59 HCV Antibody Screen   6. Type 2 diabetes mellitus without complication, without long-term current use of insulin (HCC/RAF)  E11.9 Hgb A1c     Lipid Panel     Comprehensive Metabolic Panel     CBC & Auto Differential     Albumin/Creat Ratio Ur, LAB COLLECT   7. OSA (obstructive sleep apnea)  G47.33    8. Hypertension, essential  I10    9. Postablative hypothyroidism  E89.0 TSH       ASSESSMENT  #Viral URI  -Recommended obtaining COVID-19 testing, which she has scheduled  -OTC Mucinex + oral antihistamine PRN congestion    Problem List Items Addressed This Visit        HCC Codes    ??? Diabetes mellitus (HCC/RAF)     Overview      06/21/2020 Chronic, stable. Last HgA1c 6.6% on 06/03/19. C/w MFM 500 mg BID. Repeat HgA1c. Recommended starting Atorvastatin 40 mg every day but patient wishes to  repeat labs first. Will need to clarify last retinopathy screening at next visit.          ??? Myelopathy concurrent with and due to spinal stenosis of cervical region (HCC/RAF)     Overview      06/21/2020 Radiculopathy and myelopathy symptoms since forehead injury 08/25/19. MRI cervical spine 09/30/19 showing multilevel degenerative changes and central disk/osteophyte protrusion impinging the spinal cord with severe spinal stenosis and severe BL foraminal stenosis. Originally recommended for surgical intervention, which was then cancelled due to abnormal preoperative stress test. Neurosurgery referral provided today. She has had a h/o difficult intubation in the past.             Other ??? Abnormal cardiovascular stress test     Overview      06/21/2020 Preop exam showed abnormal stress test with no EKG e/o ischemia but e/o large anterior wall ischemia with recommendation for cardiac catheterization. Had second opinion with repeat exercise nuclear stress test showing no e/o ischemia and was recommended against cardiac catheterization. I recommended starting Atorvastatin 40 mg every day for her DM2, but patient wishes to repeat labs first. Cardiology referral at patient's request.          ??? Hypertension, essential     Overview      06/21/2020 Chronic,s table. C/w Benicar 40/25 mg x1/2 tablet every day. Reassess at next in office appointment.          ??? Hypothyroidism     Current Assessment & Plan      06/21/2020 Chronic, stable. S/p RAI in 60's. Euthyroid on labs from 12/08/19. C/w Levothyroxine 100 mcg every day. Check TSH.          ??? OSA (obstructive sleep apnea)     Overview      06/21/2020 Chronic, stable. C/w CPAP at bedtime.                  Diagnoses and all orders for this visit:    Encounter to establish care    Abnormal cardiovascular stress test  -     Referral to Cardiology    Myelopathy concurrent with and due to spinal stenosis of cervical region (HCC/RAF)  -     Referral to [facility]    Screening for HIV (human immunodeficiency virus)  -     HIV-1/2 Ag/Ab 4th Generation with Reflex Confirmation; Future    Need for hepatitis C screening test  -     HCV Antibody Screen; Future    Type 2 diabetes mellitus without complication, without long-term current use of insulin (HCC/RAF)  -     Hgb A1c; Future  -     Lipid Panel; Future  -     Comprehensive Metabolic Panel; Future  -     CBC & Auto Differential; Future  -     Albumin/Creat Ratio Ur, LAB COLLECT; Future    OSA (obstructive sleep apnea)    Hypertension, essential    Postablative hypothyroidism  -     TSH; Future    Other orders  -     atorvastatin 40 mg tablet; Take 1 tablet (40 mg total) by mouth daily.      Orders Placed This Encounter   ??? Hgb A1c   ??? Lipid Panel   ??? Comprehensive Metabolic Panel   ??? CBC & Auto Differential   ??? Albumin/Creat Ratio Ur, LAB COLLECT   ??? TSH   ??? Referral to [facility] Neu[facility] Surgery   ???  Referral to Cardiology   ??? HIV-1/2 Ag/Ab 4th Generation with Reflex Confirmation   ??? HCV Antibody Screen   ??? atorvastatin 40 mg tablet       The above recommendation were discussed with the patient.  The patient has all questions answered satisfactorily and is in agreement with this recommended plan of care.    I spent a total of 60 minutes counseling/coordinating care of the items checked below on the date of this encounter.  [x]  Preparing to see the patient  [x]  Obtaining and/or reviewing separately obtained history  [x]  Performing a medically appropriate examination/evaluation  [x]  Counseling and educating the patient/family/caregiver  [x]  Ordering medication, tests, or procedures  [x]  Documenting clinical information in the EMR  [x]  Communicating with other healthcare professionals (Dr. Lenna Gilford)  []  Independently interpreting results and communicating the results to the patient/family/caregiver  30 minutes spent during visit with patient. 28 minutes spent on chart review and documentation. 2 minutes spent communicating with other healthcare professionals.     Clint Lipps, DO  Clinical Instructor  Department of Medicine  06/21/2020 9:58 AM

## 2020-06-21 NOTE — Telephone Encounter
-----   Message from Clint Lipps, DO sent at 06/21/2020  8:54 AM PST -----  Please provide patient with scheduling instructions for Cardiology Dr. Arvin Collard

## 2020-06-21 NOTE — Telephone Encounter
I spoke with patient and gave her the phone number to the Cardiologist Dept.

## 2020-06-22 ENCOUNTER — Telehealth: Payer: MEDICAID

## 2020-06-22 ENCOUNTER — Telehealth: Payer: MEDICARE

## 2020-06-23 ENCOUNTER — Telehealth: Payer: MEDICAID

## 2020-06-23 ENCOUNTER — Telehealth: Payer: MEDICARE

## 2020-06-23 DIAGNOSIS — Z20822 Suspected COVID-19 virus infection: Secondary | ICD-10-CM

## 2020-06-23 NOTE — Telephone Encounter
Message to Practice/Provider      Message: Pt originally called requesting to speak with office. However, changed her mind and said she will just discuss in her appt tomorrow.     Return call is not being requested by the patient or caller.    Patient or caller has been notified of the 24-48 hour processing turnaround time if applicable.

## 2020-06-23 NOTE — Telephone Encounter
Spoke with patient, I informed her [facility] testing does not accommodate walk in without vehicle, she can either uber to the [facility] clinic or she can have the covid test done at [facility] urgnet care  patient reports she will find a ride to her covid test, she does not want to go to urgent care because its busy and have a longer wait time      Patient would like to schedule a in office appt with Dr Ricki Miller, I informed patient, we should wait for the covid test results first before I schedule the in office appt with Dr Ricki Miller

## 2020-06-23 NOTE — H&P
[facility] , [place]  Department of Neurosurgery      Consultation Note     Primary Care Physician: Clint Lipps, DO  Referring Practitioner: No ref. provider found  Attending Physician: Lenna Gilford MD      Chief Complaint: No chief complaint on file.    Date of Service: 06/23/2020    History:     Interval History:  Chelsea [name] is a 60 y.o.-year-old female history of Abnormal cardiovascular stress test (06/21/2020), Diabetes mellitus (HCC/RAF) (02/20/2017), Hypertension, essential (06/21/2020), Hypothyroidism (06/21/2020), Myelopathy concurrent with and due to spinal stenosis of cervical region (HCC/RAF) (06/21/2020), OSA (obstructive sleep apnea) (06/21/2020), and 20 years disability due to severe panic disorder, who presents to the clinic with neck pain, imbalance, and tingling in the hands.     The patient reports that 1 year ago she was getting into a van when she hit her head and suffered significant compression. After this incident, she began experiencing severe pain in her neck and tingling in her hands. She describes the pain as primarily in the right side of her neck, trapezius, and superior aspect of the scapula. She describes the pain as achy, dull, cramping, and excrutiating in nature. Her pain is there all the time, limits her ability to sleep, and is illicited with hyperextension, such as sleeping on her stomach. While her pain was originally on the right side, she is now experiencing left-sided pain. She does have pain that occasionally shoots into her arms, right side occasionally, not left. When in the arm, her pain stays in the upper part around the deltoid and biceps, and does not shoot into her hands. She does endorse tingling in all 5 fingers of both hands, and she has a decreased ability to use her hands for fine motor movements. Chelsea [name] also endorses problems with balance. When all her symptoms began, she attempted physical therapy which did not provide notably benefit, and the therapist felt uncomfortable performing injections. She does obtain good relief from gabapentin in regards to the tingling in her hands, but it does not help with her neck pain. [name] denies bladder/bowel issues. She does have trouble walking due to feeling imbalance, and had no symptoms prior to her incident.     Finally, Chelsea [name] is able to perform all activities of daily living without assistance. She denies any other neurological symptoms.     Past Medical History:  Past Medical History:   Diagnosis Date   ??? Abnormal cardiovascular stress test 06/21/2020    06/21/2020 Preop exam showed abnormal stress test with no EKG e/o ischemia but e/o large anterior wall ischemia with recommendation for cardiac catheterization. Had second opinion with repeat exercise nuclear stress test showing no e/o ischemia and was recommended against cardiac catheterization. I recommended starting Atorvastatin 40 mg every day for her DM2, but patient wishes to repeat labs fir   ??? Diabetes mellitus (HCC/RAF) 02/20/2017    06/21/2020 Chronic, stable. Last HgA1c 6.6% on 06/03/19. C/w MFM 500 mg BID. Repeat HgA1c. Recommended starting Atorvastatin 40 mg every day but patient wishes to repeat labs first. Will need to clarify last retinopathy screening at next visit.    ??? Hypertension, essential 06/21/2020    06/21/2020 Chronic,s table. C/w Benicar 40/25 mg x1/2 tablet every day. Reassess at next in office appointment.    ??? Hypothyroidism 06/21/2020   ??? Myelopathy concurrent with and due to spinal stenosis of cervical region (HCC/RAF) 06/21/2020    06/21/2020 Radiculopathy and myelopathy symptoms since forehead injury 08/25/19.  MRI cervical spine 09/30/19 showing multilevel degenerative changes and central disk/osteophyte protrusion impinging the spinal cord with severe spinal stenosis and severe BL foraminal stenosis. Originally recommended for surgical intervention, which was then cancelled due to abnormal preoperative stress test. Neurosurge   ??? OSA (obstructive sleep apnea) 06/21/2020    06/21/2020 Chronic, stable. C/w CPAP at bedtime.        Past Surgical History:  Past Surgical History:   Procedure Laterality Date   ??? CESAREAN SECTION      x2   ??? CHOLECYSTECTOMY  1987   ??? INCISIONAL HERNIA REPAIR  1987       Medications:  Outpatient Medications Prior to Visit   Medication Sig   ??? atorvastatin 40 mg tablet Take 1 tablet (40 mg total) by mouth daily.   ??? gabapentin 100 mg capsule    ??? gabapentin 300 mg capsule take 1 capsule by mouth five times a day   ??? levothyroxine 100 mcg tablet take 1 tablet by mouth every morning before breakfast   ??? metFORMIN 500 mg tablet Take 500 mg by mouth two (2) times daily with meals.   ??? olmesartan-hydroCHLOROthiazide 40-25 mg tablet Take 0.5 tablets by mouth daily.     No facility-administered medications prior to visit.       Allergies:  Not on File    Social History:  Social History     Socioeconomic History   ??? Marital status: Single     Spouse name: Not on file   ??? Number of children: Not on file   ??? Years of education: Not on file   ??? Highest education level: Not on file   Occupational History   ??? Not on file   Tobacco Use   ??? Smoking status: Never Smoker   ??? Smokeless tobacco: Never Used   Substance and Sexual Activity   ??? Alcohol use: Yes   ??? Drug use: Not on file   ??? Sexual activity: Not on file   Other Topics Concern   ??? Not on file   Social History Narrative   ??? Not on file     Social Determinants of Health     Physical Activity: Not on file   Stress: Not on file   Financial Resource Strain: Not on file       Family History:  No family history on file.    Review of Systems:  A 14-system review was obtained and reviewed by me (see chart).  Please refer to the new patient questionnaire dated 06/23/2020 for full details of the ROS.    Physical Exam:   All within appearance of video frame:  General: Well appearing, no acute distress  Psych: Alert and oriented to person, place and time  HEENT:  Normocephalic, normal sclera, neck supple  Card: regular rate and rhythm, pulses palpable, warm and well perfused    Resp: No labored breathing  GI: Abdomen benign, soft, nondistended  Extremities: No peripheral edema or digital cyanosis  ROM: Symmetric and within normal limits.  Skin: intact, no rashes or lesions    Diagnostic Imaging:   I personally reviewed the imaging studies (not just the reports) and went over them in detail with the patient at the clinic visit today.  My impression of the imaging is as follows:    MRI Cervical Spine (report available, no imaging): Review of the images provided by the patient during this visit demonstrate:  IMPRESSION: No significant change.    1. ???Multilevel degenerative changes  most pronounced at C5-C6 where there is trace   retrolisthesis with a central disk/osteophyte protrusion impinging the spinal cord with   severe spinal canal stenosis. There is mild facet arthropathy and bilateral   uncovertebral hypertrophy causing severe bilateral foraminal stenosis. This affects the exiting   bilateral C6 nerve roots. This level is unchanged.    2. Reversal of the cervical lordosis suggesting muscle spasm and/or degenerative   changes.     Assessment/Plan:   Impression:  Chelsea [name] is a 60 y.o.-year-old female who presents to the clinic with neck pain, imbalance, and tingling in the hands.     Plan:  The patient is endorsing symptoms of cervical myeloradiculopathy, with severe central and foraminal stenosis at C5/6. He was scheduled previously at [facility], but has changed care to [facility] due to insurance issues. Currently, Raeden's symptoms of radiculopathy are severe and her symptoms of myelopathy are mild to moderate. I do suspect the need for surgery, but I also do not have access to her imaging, which is now 60 year old. As such, I will provide the patient to obtain MR cervical imaging wo contrast for evaluation of her cervical stenosis. I also recommend the patient obtain XR cervical imaging ap + lat + flex + ex + oblique views. We also advised the patient to upload her imaging from [place] into our system in the interim. Per discussion with the patient, I will also refer her to Dr. Vickki Hearing for consideration of cervical interlaminar ESI. The proper scripts and referrals were provided to the patient and explained in detail, and the scheduling contact phone number for Dr. Vickki Hearing was provided.    I would like to reconnect with Steward Drone in 1 month to review her new MR imaging and the result of the MRI from [place]. We will plan for surgery via 2 options: disc arthroplasty or ACDF. I am not sure which she will be a candidate for, as I have not seen her imaging. Once we get films, we will plan to finalize a surgical plan and discuss with her and her kids. It was a pleasure meeting Jossilyn. The patient expressed an understanding and agreement to the plan. We encouraged the patient to contact our clinic in the interim with any questions or concerns. All questions were answered.     60 minutes were spent personally by me today on this encounter which may include today???s pre-visit review of the chart, time spent during the visit, and today???s time spent after the visit documenting and coordinating care. All questions were answered and Nataki Mccrumb understood and was satisfied with this plan.     Thank you Dr. Ricki Miller for allowing [place] to participate in the care of Sheretta Grumbine.    Clide Deutscher  assisted in the evaluation of this patient, which was directly supervised by Dr. Lenna Gilford.       SCRIBE SIGNATURE(S):  I, Marcello Moores, have assisted Cooper Moroney, Doy Hutching., MD with the documentation for Denecia Brunette on   06/23/2020 at 7:35 AM.    PHYSICIAN SIGNATURE(S):  Bobbe Medico., MD  06/23/2020 7:35 AM    I have reviewed this note, written by Marcello Moores and attest that it is an accurate representation of my H & P and other events of the outpatient visit except if otherwise noted.

## 2020-06-23 NOTE — Telephone Encounter
Spoke with patient, she saw Dr Ricki Miller on 06/21/20 to establish care and for a cough, patient reports she had a place in mind where she was going to walk in for a covid test, but that covid testing site only does drive thru testing and patient does not drive, patient would like to ask Dr Ricki Miller for a order for a covid test and she will call the [facility] covid scheduling number to see if they offer a place for her to walk in for the covid test

## 2020-06-23 NOTE — Telephone Encounter
I spoke with patient and informed her that I will call her on Thursday to help check her insurance information.

## 2020-06-23 NOTE — Telephone Encounter
Call Back Request      Reason for call back: following up with 01/04 message from [place]. Pt would like to speak to R[place] today or tomorrow about covid test. She stated she does not drive and needs an on sight location please. She is getting better from her symptoms but still wants to be tested please.     Any Symptoms:  [x]  Yes  []  No       If yes, what symptoms are you experiencing:    o Duration of symptoms (how long):    o Have you taken medication for symptoms (OTC or Rx):      Patient or caller has been notified of the 24-48 hour turnaround time.

## 2020-06-23 NOTE — Telephone Encounter
Call Back Request      Reason for call back: patient is requesting a call back from [place] regarding an insurance question and a question of which type of covid test she needs to take.     Any Symptoms:  []  Yes  [x]  No       If yes, what symptoms are you experiencing:    o Duration of symptoms (how long):    o Have you taken medication for symptoms (OTC or Rx):      Patient or caller has been notified of the 24-48 hour turnaround time.

## 2020-06-23 NOTE — Telephone Encounter
Call Back Request      Reason for call back:  Pt states she has medicaid active until February, and then medi-medi becomes active 07/20/20. Pt was advised by the insurance that medicaid could cover the initial visit.     Any Symptoms:  []  Yes  [x]  No       If yes, what symptoms are you experiencing:    o Duration of symptoms (how long):    o Have you taken medication for symptoms (OTC or Rx):      Patient or caller has been notified of the 24-48 hour turnaround time.

## 2020-06-24 DIAGNOSIS — G549 Nerve root and plexus disorder, unspecified: Secondary | ICD-10-CM

## 2020-06-28 ENCOUNTER — Telehealth: Payer: PRIVATE HEALTH INSURANCE

## 2020-06-28 ENCOUNTER — Ambulatory Visit: Payer: MEDICAID

## 2020-06-28 NOTE — Telephone Encounter
I changed patients appointment with MD.

## 2020-06-28 NOTE — Telephone Encounter
Call Back Request      Reason for call back: Per pt, has a return appt sched for 01/14, and would like to r/s it to 01/13 at 9:45am, but wants to confirm that is okay with office? Per pt, she tested negative for covid and uploaded test result to a mychart message today 01/10. Per pt, please call back at t: [phone] to confirm okay to r/s to 01/13.    Any Symptoms:  []  Yes  []  No       If yes, what symptoms are you experiencing:    o Duration of symptoms (how long):    o Have you taken medication for symptoms (OTC or Rx):      Patient or caller has been notified of the 24-48 hour turnaround time.

## 2020-06-29 ENCOUNTER — Telehealth: Payer: MEDICARE

## 2020-06-29 ENCOUNTER — Ambulatory Visit: Payer: PRIVATE HEALTH INSURANCE

## 2020-06-29 DIAGNOSIS — M4722 Other spondylosis with radiculopathy, cervical region: Secondary | ICD-10-CM

## 2020-06-29 DIAGNOSIS — M4712 Other spondylosis with myelopathy, cervical region: Secondary | ICD-10-CM

## 2020-06-30 ENCOUNTER — Ambulatory Visit: Payer: MEDICARE

## 2020-06-30 MED ORDER — METHYLPREDNISOLONE 4 MG PO TBPK
ORAL_TABLET | 0 refills | Status: AC
Start: 2020-06-30 — End: ?

## 2020-06-30 NOTE — Patient Instructions
'PLEASE REVIEW INFORMATION BELOW:     ??? For all visits, please be ready to show your photo ID and insurance card(s), and pay any applicable co-pays that will be due at the time of visit.     ??? It is your responsibility to be sure that your health insurance plan covers any lab test, radiology procedure, consultation, orthopaedic procedures which include, but not limit, injections, casting, bracing, durable medical equipment, etc. which has been recommended for you.  Contacting the Office:  ??? Each physician office has a Patient Care Coordinator that will help coordinate your care. They can help with:  ??? General questions  ??? Relay specific questions to the doctor   ??? Schedule surgery  ??? Follow up on authorizations that are pending  ??? Provide paperwork such as a return to work/school letter or disability forms  ??? Please allow 24 hours for messages to be returned from the physician???s office.  ??? There is a 7-10 business day turnaround for all forms.  ??? We encourage you to sign up for [facility] to communicate directly with the office or physician online. Please ask any staff member for additional information.   ??? Kim: [phone],  [phone]    Following Up with the Physician:  ??? Before leaving, physician will let you know if you will need to follow-up within a certain time. If yes, make sure you schedule your follow-up appointment at the front desk or by calling the centralized scheduling center at:  ???  [phone] Orthopaedic Surgery.  ???  [phone] [facility].  ???  [phone] [facility].  ??? Did your physician order any labs, imaging, or tests that should be completed before your next follow-up? If yes, please continue reading below.  Physician Order/Pre-authorization:  ??? For any physician orders/pre-authorizations, please allow 5-7 business days for authorization to be obtained.  Imaging (MRI, X-Ray, CT Scan, etc)  ??? If you are scheduling at a [facility], please call Radiology at [phone] to schedule your imaging. You do not need to wait for an authorization to schedule.   ??? If you are scheduling outside of [place]:  ??? Notify your physician???s office of your location choice, this will help in obtaining authorization for the correct facility.  ??? Once you complete the imaging, obtain a copy of the report and images. Provide a copy to your physician for review.  ??? Schedule a follow-up appointment to review the images, unless stated otherwise by your physician or the office.  Injections  ??? If the physician recommends an injection, plan anywhere from 1-5 follow-up visits with your physician in a specific timeframe to complete the treatment plan. The follow-up visits can be as frequent as once a week until all the dosage has been administered.  ??? If authorization is required, allow 5-7 business days for the office to obtain authorization.  Lab work  ??? If labs are ordered, keep in mind:  ??? Fasting or non-fasting?  ??? How soon must they be done? (as soon as possible, one week before next appointment, a month before next appointment, etc.)  ??? Do you need to schedule a follow-up visit to discuss results or did the physician say they will call you with the results?   ??? If lab work is completed at a [facility], your results will automatically be sent to the physician, however allow a few days for results to be completed. Some labs require more time than others for results to finalize.  ???  If lab work is completed outside a [facility], bring a copy of the lab orders with you. After a few days, follow-up with the lab to obtain a copy of your lab results for your physician.  Physical Therapy  ??? If physical therapy is recommended, notify the office of where you would prefer to seek therapy.  ??? Before doing so, verify with the physical therapy location that your insurance will be accepted.  ??? If authorization is required, allow 5-7 business days for the office to obtain authorization.  ??? If physical therapy is completed at a [facility] facility, the progress reports will automatically be sent to the physician.  ??? Physical therapy requires multiple visits to your chosen facility, anywhere from 2 weeks and more depending on the nature of your case.  Nerve Conduction/EMG Study  ??? If the physician ordered a Nerve Conduction or EMG Study, contact the Refer to Physician Office to schedule your appointment.      If you develop any severe worsening pain, new weakness or numbness please contact the office or present to the Emergency Department.  If you experience new loss of bowel or bladder you should go to the Emergency Department.

## 2020-06-30 NOTE — Progress Notes
Patient Consent to Telehealth   The patient agreed to participate in the video visit prior to joining the visit.        [facility] Physiatry Consult Note    Attending Physician: Rhetta Mura, DO    Chief Complaint:    Chief Complaint   Patient presents with   ??? Neck Pain       History of Present Illness:   Ms. Zuccaro is a 60 y.o. female, who is seen for consultation and evaluation at the request of Dr. Konrad Felix for pain in the neck with radiation into the bilateral upper extremity and consideration of injection.  Patient reports she hit forehead about 9 months ago.  She then got severe neck and bilateral radiating arm pain, weakness and numbness.  Numbness is constant in the hands.  She has difficulty with arm coordination.  She reports balance issues chronically and has need a cane.      She has tried naprosyn-- doesn't tolrate  Gabapentin 300mg  1300mg  daily- mild help      Previous work-up has included: MRI  Has tried the following treatments: medications  Associated symptoms - + bilateral upper extremity tingling, numbness, or weakness.  Denies bowel or bladder incontinence.  + gait instability.    Current Medications:  Current Outpatient Medications   Medication Sig   ??? atorvastatin 40 mg tablet Take 1 tablet (40 mg total) by mouth daily.   ??? gabapentin 100 mg capsule    ??? gabapentin 300 mg capsule take 1 capsule by mouth five times a day   ??? levothyroxine 100 mcg tablet take 1 tablet by mouth every morning before breakfast   ??? methylPREDNISolone 4 mg tablet pack Take as directed on package on package.   ??? olmesartan-hydroCHLOROthiazide 40-25 mg tablet Take 0.5 tablets by mouth daily.     No current facility-administered medications for this visit.        Allergies:  Iodinated diagnostic agents    Past Medical History:   Past Medical History:   Diagnosis Date   ??? Abnormal cardiovascular stress test 06/21/2020    06/21/2020 Preop exam showed abnormal stress test with no EKG e/o ischemia but e/o large anterior wall ischemia with recommendation for cardiac catheterization. Had second opinion with repeat exercise nuclear stress test showing no e/o ischemia and was recommended against cardiac catheterization. I recommended starting Atorvastatin 40 mg every day for her DM2, but patient wishes to repeat labs fir   ??? Diabetes mellitus (HCC/RAF) 02/20/2017    06/21/2020 Chronic, stable. Last HgA1c 6.6% on 06/03/19. C/w MFM 500 mg BID. Repeat HgA1c. Recommended starting Atorvastatin 40 mg every day but patient wishes to repeat labs first. Will need to clarify last retinopathy screening at next visit.    ??? Hypertension, essential 06/21/2020    06/21/2020 Chronic,s table. C/w Benicar 40/25 mg x1/2 tablet every day. Reassess at next in office appointment.    ??? Hypothyroidism 06/21/2020   ??? Myelopathy concurrent with and due to spinal stenosis of cervical region (HCC/RAF) 06/21/2020    06/21/2020 Radiculopathy and myelopathy symptoms since forehead injury 08/25/19. MRI cervical spine 09/30/19 showing multilevel degenerative changes and central disk/osteophyte protrusion impinging the spinal cord with severe spinal stenosis and severe BL foraminal stenosis. Originally recommended for surgical intervention, which was then cancelled due to abnormal preoperative stress test. Neurosurge   ??? OSA (obstructive sleep apnea) 06/21/2020    06/21/2020 Chronic, stable. C/w CPAP at bedtime.        Past Surgical History:  Past Surgical History:   Procedure Laterality Date   ??? CESAREAN SECTION      x2   ??? CHOLECYSTECTOMY  1987   ??? INCISIONAL HERNIA REPAIR  1987       SocHx:   Social History     Socioeconomic History   ??? Marital status: Single     Spouse name: Not on file   ??? Number of children: Not on file   ??? Years of education: Not on file   ??? Highest education level: Not on file   Occupational History   ??? Not on file   Tobacco Use   ??? Smoking status: Never Smoker   ??? Smokeless tobacco: Never Used   Substance and Sexual Activity   ??? Alcohol use: Yes   ??? Drug use: Not on file   ??? Sexual activity: Not on file   Other Topics Concern   ??? Not on file   Social History Narrative   ??? Not on file     Social Determinants of Health     Physical Activity: Not on file   Stress: Not on file   Financial Resource Strain: Not on file       HABITS:       Social History     Tobacco Use   Smoking Status Never Smoker   Smokeless Tobacco Never Used        Social History     Substance and Sexual Activity   Alcohol Use Yes        Social History     Substance and Sexual Activity   Drug Use Not on file       FamHx:    History reviewed. No pertinent family history.     Allergies:  Iodinated diagnostic agents        Relevant Labs:  Lab Results   Component Value Date    HGBA1C 6.7 (H) 06/29/2020     Creatinine   Date Value Ref Range Status   06/29/2020 0.64 0.60 - 1.30 mg/dL Final     Lab Results   Component Value Date    WBC 6.09 06/29/2020    HGB 13.1 06/29/2020    HCT 43.7 06/29/2020    MCV 93.8 06/29/2020    PLT 263 06/29/2020       Physical Exam: Exam limited today, Video Visit    Vital Signs: There were no vitals taken for this visit.  General:  The patient is well developed, well nourished, and in no acute distress, alert with appropriate mood and affect.  She is wearing soft collar neck brace  Eyes: EOMI  Respiratory: Normal work of breathing without apnea and no evidence of respiratory distress without use of accessory muscles.      Imaging and work-up:  Imaging was reviewed personally by me and demonstrate by reporting    Cervical MRI 09/30/2019  ''IMPRESSION -     No significant change.     1. ???Multilevel degenerative changes most pronounced at C5-C6 where there is trace   retrolisthesis with a central disk/osteophyte protrusion impinging the spinal cord with   severe spinal canal stenosis. There is mild facet arthropathy and bilateral   uncovertebral hypertrophy causing severe bilateral foraminal stenosis. This affects the exiting   bilateral C6 nerve roots. This level is unchanged. 2. Reversal of the cervical lordosis suggesting muscle spasm and/or degenerative   changes.    ''  Xray cervical spine  ''IMPRESSION:   Impression:     Degenerative  spondylosis most pronounced at C5-6.     No evidence for dynamic instability   ''  Commentary and Medical Decision Making:    Riyan Gavina is a 60 y.o. female who presents to clinic today for evaluation for cervical radicular pain.     Assessment:  1. Cervical myeloradiculoapthy    Plan:    Educated patient regarding complexity of neck pain and multidisciplinary approach to managing symptoms.    Diagnostic Imaging:   - Reviewed image report    Medications:   Medrol pack-- risks explained    Physical therapy:  No cervical manipulation     Procedures:   - Discussed ESI- she is nervous and would like to consider.  Order placed.  She can call to schedule     Discussed that I recommend surgical follow up due to her myelopathy    Follow-up:  Primary care provider for further management or medication refills and therapy renewals.     Spine Center: with spine surgery and for injection if she would like to proceed    Advised the patient that if neurological issues were to develop such as weakness, bowel or bladder control, or worsening pain, that they should present immediately to the closest Emergency Room.    Thank you for this consultation; If I can be of further service, please contact my office.    Medical Decision Making: Moderate Complexity  1) Number and Complexity of Problems Addressed at Encounter:  - 1 undiagnosed new problem with uncertain prognosis    2) Data Reviewed and Analyzed today:  - Tests/Documents (3)  - Reviewed prior external notes from referring provider, PCP, or surgeon  - Reviewed prior Xrays  - Reviewed Prior MRI  - Reviewed CBC  -Reviewed CMP  - Reviewed HgA1C      3) Risk of Morbidity  - Discussed minor procedure and procedure risk factors          Rhetta Mura, DO  Interventional Spine  [facility]  Dept. Of Orthopedic Surgery drug management performed today  - Discussed minor procedure and procedure risk factors          Rhetta Mura, DO  Interventional Spine  [facility]  Dept. Of Orthopedic Surgery

## 2020-07-01 ENCOUNTER — Ambulatory Visit: Payer: PRIVATE HEALTH INSURANCE

## 2020-07-01 DIAGNOSIS — Z1211 Encounter for screening for malignant neoplasm of colon: Secondary | ICD-10-CM

## 2020-07-01 DIAGNOSIS — L720 Epidermal cyst: Secondary | ICD-10-CM

## 2020-07-01 DIAGNOSIS — E119 Type 2 diabetes mellitus without complications: Secondary | ICD-10-CM

## 2020-07-01 DIAGNOSIS — E785 Hyperlipidemia, unspecified: Secondary | ICD-10-CM

## 2020-07-01 DIAGNOSIS — Z124 Encounter for screening for malignant neoplasm of cervix: Secondary | ICD-10-CM

## 2020-07-01 DIAGNOSIS — E890 Postprocedural hypothyroidism: Secondary | ICD-10-CM

## 2020-07-01 DIAGNOSIS — B351 Tinea unguium: Secondary | ICD-10-CM

## 2020-07-01 DIAGNOSIS — Z23 Encounter for immunization: Secondary | ICD-10-CM

## 2020-07-01 MED ORDER — ATORVASTATIN CALCIUM 20 MG PO TABS
20 mg | ORAL_TABLET | Freq: Every day | ORAL | 1 refills | Status: AC
Start: 2020-07-01 — End: ?

## 2020-07-01 MED ORDER — CICLOPIROX 8 % EX SOLN
Freq: Every evening | TOPICAL | 3 refills | Status: AC
Start: 2020-07-01 — End: ?

## 2020-07-01 NOTE — Patient Instructions
Look into Shingrix, Tdap, and Pneumococcal vaccines. I recommend all of these for you.

## 2020-07-01 NOTE — Progress Notes
PATIENT: Chelsea [name]  MRN: [id number]  DOB: 02-Aug-1960  DATE OF SERVICE: 07/01/2020    CHIEF COMPLAINT:   Chief Complaint   Patient presents with   ??? Follow-up        HPI   Chelsea [name] is a 60 y.o. female who  has a past medical history of Abnormal cardiovascular stress test (06/21/2020), Diabetes mellitus (HCC/RAF) (02/20/2017), Hypertension, essential (06/21/2020), Hypothyroidism (06/21/2020), Myelopathy concurrent with and due to spinal stenosis of cervical region (HCC/RAF) (06/21/2020), and OSA (obstructive sleep apnea) (06/21/2020). who presents for   Chief Complaint   Patient presents with   ??? Follow-up       #Neck Pain  Onset since forehead injury 08/25/2019. Pain at neck with radiation down right arm and BL shoulders. Sleeps in neck brace. Lying supine worsens the pain. Associated muscle weakness and numbness in BL hands - can't open jars or buckle seat belts. Currently taking Gabapentin, which helps make the pain the manageable. Was originally planning on surgery but then had abnormal stress test as part of preop exam as detailed below.   07/06/2020 Chronic, stable. Seen by neurosurg Dr. Konrad Felix who recommended repeating MRI in 1 month and possible plans for surgical intervention. Seen by PMR who ordered medrol dose pack and consider ESI.   ???  #Hypothyroidism  S/p RAI in 30's. Currently on Levothyroxine 100 mcg every day. TSH 2.32 on 12/08/19.  07/06/2020 Chronic, stable. Euthyroid on labs from 06/29/20.   ???  #DM2  Diagnosed with HgA1c 9%. Currently on MFM 500 mg BID. Last HgA1c 6.6% on 06/03/19.  07/06/2020 Last HgA1c 6.7% on 06/29/20. Negative UACR. Patient now reports that she is not taking any diabetic medications but she has lost 50 lbs since her initial diagnosis.   ???  #HTN  Chronic, stable. Benicar 40/25 mg x1/2 tablet every day    #Obesity  Chronic, stable.     #OSA  Remains on CPAP qHS      Not addressed this visit:    #Abnormal Cardiac Stress Test  Had abnormal stress as part of preop exam with cardiology showing no ischemic EKG changes but e/o large anterior wall ischemia. Recommended for angiogram and starting ASA 81 mg every day. She was seen by a second cardiologist who repeated an exercise nuclear stress test with no evidence of ischemia and recommended stopping aspirin and no need for angiogram. She would like a second opinion at [facility]. Denies CP, SOB, orthopnea. Has mild BL LE edema.   ???        Patient Care Team:  Clint Lipps, DO as PCP - General (Family Medicine)    MEDS     Outpatient Medications Prior to Visit   Medication Sig   ??? gabapentin 100 mg capsule    ??? gabapentin 300 mg capsule take 1 capsule by mouth five times a day   ??? levothyroxine 100 mcg tablet take 1 tablet by mouth every morning before breakfast   ??? methylPREDNISolone 4 mg tablet pack Take as directed on package on package.   ??? olmesartan-hydroCHLOROthiazide 40-25 mg tablet Take 0.5 tablets by mouth daily.   ??? atorvastatin 40 mg tablet Take 1 tablet (40 mg total) by mouth daily.   ??? metFORMIN 500 mg tablet Take 500 mg by mouth two (2) times daily with meals.     No facility-administered medications prior to visit.       PHYSICAL EXAM      Last Recorded Vital Signs:    07/01/20 1018  BP: 114/80   Pulse:    Resp:    Temp:    SpO2:      Body mass index is 46.27 kg/m???.  Wt Readings from Last 3 Encounters:   07/01/20 (!) 253 lb (114.8 kg)   06/21/20 (!) 260 lb (117.9 kg)      Gen - Awake. No acute distress. Age appropriate appearance.   Eyes - EOMI. No conjunctival pallor or injection. No scleral icterus.  Neck - Supple. No thyroid enlargement, tenderness, or nodules. No cervical, supraclavicular or infraclavicular lymphadenopathy.   Pulm - Nml effort. Clear to auscultation bilaterally. No wheezes, rales, or rhonchi.  Cards - RRR. S1 S2. No murmurs, rubs, or gallops.   GI - Soft. Nondistended. Nontender to palpation in all quadrants. No rebound, guarding, or rigidity.   MSK - No cyanosis, clubbing, or LE edema.   Neuro - AAOx3. CN2-12 grossly intact.  Psych - Normal mood and affect.       LABS/STUDIES   I have:   []  Reviewed/ordered []  1 []  2 []  ? 3 unique laboratory, radiology, and/or diagnostic tests noted below    []  Reviewed []  1 []  2 []  ? 3 prior external notes and incorporated into patient assessment    Neurosurg Dr. Lenna Gilford 06/23/20  []  Discussed management or test interpretation with external provider(s) as noted       Lab Studies:  CBC:   Results for orders placed or performed in visit on 06/29/20   CBC   Result Value Ref Range    White Blood Cell Count 6.09 4.16 - 9.95 x10E3/uL    Red Blood Cell Count 4.66 3.96 - 5.09 x10E6/uL    Hemoglobin 13.1 11.6 - 15.2 g/dL    Hematocrit 84.6 96.2 - 45.2 %    Mean Corpuscular Volume 93.8 79.3 - 98.6 fL    Mean Corpuscular Hemoglobin 28.1 26.4 - 33.4 pg    MCH Concentration 30.0 (L) 31.5 - 35.5 g/dL    Red Cell Distribution Width-SD 44.5 36.9 - 48.3 fL    Red Cell Distribution Width-CV 12.9 11.1 - 15.5 %    Platelet Count, Auto 263 143 - 398 x10E3/uL    Mean Platelet Volume 10.3 9.3 - 13.0 fL    Nucleated RBC%, automated 0.0 No Ref. Range %    Absolute Nucleated RBC Count 0.00 0.00 - 0.00 x10E3/uL    Neutrophil Abs (Prelim) 3.78 See Absolute Neut Ct. x10E3/uL   Differential, Automated   Result Value Ref Range    Neutrophil Percent, Auto 62.0 No Ref. Range %    Lymphocyte Percent, Auto 30.4 No Ref. Range %    Monocyte Percent, Auto 4.8 No Ref. Range %    Eosinophil Percent, Auto 1.8 No Ref. Range %    Basophil Percent, Auto 0.8 No Ref. Range %    Immature Granulocytes% 0.2 No Reference Range %    Absolute Neut Count 3.78 1.80 - 6.90 x10E3/uL    Absolute Lymphocyte Count 1.85 1.30 - 3.40 x10E3/uL    Absolute Mono Count 0.29 0.20 - 0.80 x10E3/uL    Absolute Eos Count 0.11 0.00 - 0.50 x10E3/uL    Absolute Baso Count 0.05 0.00 - 0.10 x10E3/uL    Absolute Immature Gran Count 0.01 0.00 - 0.04 x10E3/uL   CBC & Auto Differential    Narrative    The following orders were created for panel order CBC & Auto Differential.  Procedure  Abnormality         Status                     ---------                               -----------         ------                     WGN[[id number]]                          Abnormal            Final result               Differential, Automated[[id number]]                          Final result                 Please view results for these tests on the individual orders.       CMP:   Results for orders placed or performed in visit on 06/29/20   Comprehensive Metabolic Panel   Result Value Ref Range    Sodium 139 135 - 146 mmol/L    Potassium 4.0 3.6 - 5.3 mmol/L    Chloride 102 96 - 106 mmol/L    Total CO2 27 20 - 30 mmol/L    Anion Gap 10 8 - 19 mmol/L    Glucose 110 (H) 65 - 99 mg/dL    Creatinine 7.84 6.96 - 1.30 mg/dL    GFR Estimate for African American >89 See GFR Additional Information mL/min/1.73m2    GFR Estimate for Non-African American >89 See GFR Additional Information mL/min/1.20m2    GFR Additional Information See Comment     Urea Nitrogen 12 7 - 22 mg/dL    Calcium 9.3 8.6 - 29.5 mg/dL    Total Protein 6.9 6.1 - 8.2 g/dL    Albumin 4.0 3.9 - 5.0 g/dL    Bilirubin,Total 0.3 0.1 - 1.2 mg/dL    Alkaline Phosphatase 85 37 - 113 U/L    Aspartate Aminotransferase 16 13 - 62 U/L    Alanine Aminotransferase 20 8 - 70 U/L     Hgb A1C:   Lab Results   Component Value Date/Time    HGBA1C 6.7 (H) 06/29/2020 08:19 AM     Lipids:   Results for orders placed or performed in visit on 06/29/20   Lipid Panel   Result Value Ref Range    Cholesterol 185 See Comment mg/dL    Cholesterol,LDL,Calc 120 (H) <100 mg/dL    Cholesterol, HDL 44 (L) >50 mg/dL    Triglycerides 284 <132 mg/dL    Non-HDL,Chol,Calc 440 (H) <130 mg/dL      TSH:   Lab Results   Component Value Date/Time    TSH 2.2 06/29/2020 08:19 AM        A&P   Chelsea [name] is a 60 y.o. female presenting for   Chief Complaint   Patient presents with   ??? Follow-up         PROBLEM & ORDERS    ICD-10-CM    1. Myelopathy concurrent with and due to spinal stenosis of cervical region (HCC/RAF)  M48.02     G99.2    2. Type 2  diabetes mellitus without complication, without long-term current use of insulin (HCC/RAF)  E11.9 Referral to Ophthalmology     Hgb A1c   3. Hypertension, essential  I10    4. Postablative hypothyroidism  E89.0    5. Encounter for administration of COVID-19 vaccine  Z23 COVID-19 Vaccine [employer]) Injection   6. Epidermoid cyst  L72.0 Referral to Dermatology   7. Onychomycosis  B35.1 ciclopirox 8% solution   8. Colon cancer screening  Z12.11 Fecal Occult Blood Immunoassay (FIT)   9. Hyperlipidemia, unspecified hyperlipidemia type  E78.5 Lipid Panel   10. Cervical cancer screening  Z12.4 Referral to Obstetrics & Gynecology   11. OSA (obstructive sleep apnea)  G47.33 Referral for Sleep Consultation, Med/Pulm (Adult/Peds)   12. Morbid (severe) obesity due to excess calories (HCC/RAF)  E66.01    13. Candidate for statin therapy due to risk of future cardiovascular event  Z91.89        ASSESSMENT  Problem List Items Addressed This Visit        HCC Codes    ??? Diabetes mellitus (HCC/RAF)     Overview      06/21/2020 Chronic, stable. Last HgA1c 6.6% on 06/03/19. C/w MFM 500 mg BID. Repeat HgA1c. Recommended starting Atorvastatin 40 mg every day but patient wishes to repeat labs first. Will need to clarify last retinopathy screening at next visit.   07/06/2020 Chronic, stable. HgA1c 6.7% on 06/29/20 with negative UACR. Recommended resuming MFM XR 500 mg every day, declined at this time. Start Atorvastatin 20 mg every day with plans to uptitrate. Recommended pneumococcal vaccine, which was declined at this time. Ophtho referral for retinopathy screening.          ??? Morbid (severe) obesity due to excess calories (HCC/RAF)     Overview      Encouraged DASH/ADA or Mediterranean diet with appropriate caloric portioning and focus on lean meats and vegetables while limiting salt intake to < 2000 mg/day with low fat/low sugar diet. Encouraged regular aerobic exercise for at least 15 mins/week for cardioprotective benefits and strength training at least 2 days per week. Discussed goal BMI < 30 for morbidity/mortality  benefits. Recommended bariatric surgery evaluation, which was declined at this time. Recommended resuming MFM for DM2, which was declined at this time. Consider GLP-1 agonist.          ??? Myelopathy concurrent with and due to spinal stenosis of cervical region (HCC/RAF)     Overview      06/21/2020 Radiculopathy and myelopathy symptoms since forehead injury 08/25/19. MRI cervical spine 09/30/19 showing multilevel degenerative changes and central disk/osteophyte protrusion impinging the spinal cord with severe spinal stenosis and severe BL foraminal stenosis. Originally recommended for surgical intervention, which was then cancelled due to abnormal preoperative stress test. Neurosurgery referral provided today. She has had a h/o difficult intubation in the past.   07/06/2020 Chronic, stable. Evaluated by neurosurg with plans to repeat MRI in 1 month and consider surgical intervention. Seen by PMR who prescribed medrol dose pack. C/w Gabapentin.             Other    ??? Candidate for statin therapy due to risk of future cardiovascular event     Overview      07/06/2020 Start Atorvastatin 20 mg every day with plans to uptitrate. Repeat lipid panel in 1 month.          ??? Hypertension, essential     Overview      06/21/2020 Chronic,s table. C/w Benicar  40/25 mg x1/2 tablet every day. Reassess at next in office appointment.   07/06/2020 Chronic, stable. BP acceptable. C/w Benicar 40/25 mg x1/2 tablet every day.          ??? Hypothyroidism     Overview      S/p RAI in 30's.  07/06/2020 Chronic, stable. Euthyroid on labs from 06/29/20. C/w current regimen of Levothyroxine 100 mcg every day.          ??? OSA (obstructive sleep apnea)     Overview      06/21/2020 Chronic, stable. C/w CPAP at bedtime.   07/06/2020 Chronic, stable. C/w CPAP at bedtime. Sleep medicine referral to establish care.              #Onychomycosis  -Start Ciclopirox     #Cervical Cancer Screening  -GYN referral at patient's request    #Epidermoid Cyst  Desires removal  -Dermatology referral for consideration of excision    Diagnoses and all orders for this visit:    Myelopathy concurrent with and due to spinal stenosis of cervical region (HCC/RAF)    Type 2 diabetes mellitus without complication, without long-term current use of insulin (HCC/RAF)  -     Referral to Ophthalmology  -     Hgb A1c; Future    Hypertension, essential    Postablative hypothyroidism    Encounter for administration of COVID-19 vaccine  -     COVID-19 Vaccine [employer]) Injection    Epidermoid cyst  -     Referral to Dermatology    Onychomycosis  -     ciclopirox 8% solution; Apply topically at bedtime Apply to adjacent skin and affected nails daily. Remove with alcohol every 7 days. Use up to 48 weeks..    Colon cancer screening  -     Fecal Occult Blood Immunoassay (FIT); Future    Hyperlipidemia, unspecified hyperlipidemia type  -     Lipid Panel; Future    Cervical cancer screening  -     Referral to Obstetrics & Gynecology    OSA (obstructive sleep apnea)  -     Referral for Sleep Consultation, Med/Pulm (Adult/Peds)    Morbid (severe) obesity due to excess calories (HCC/RAF)    Candidate for statin therapy due to risk of future cardiovascular event    Other orders  -     atorvastatin 20 mg tablet; Take 1 tablet (20 mg total) by mouth daily.      Orders Placed This Encounter   ??? COVID-19 Vaccine [employer]) Injection   ??? Fecal Occult Blood Immunoassay (FIT)   ??? Hgb A1c   ??? Lipid Panel   ??? Referral to Dermatology   ??? Referral to Ophthalmology   ??? Referral to Obstetrics & Gynecology   ??? Referral for Sleep Consultation, Med/Pulm (Adult/Peds)   ??? atorvastatin 20 mg tablet   ??? ciclopirox 8% solution       The above recommendation were discussed with the patient.  The patient has all questions answered satisfactorily and is in agreement with this recommended plan of care.        Return in about 3 months (around 09/29/2020) for Follow up chronic conditions.     Clint Lipps, DO  Clinical Instructor  Department of Medicine  07/06/2020 9:20 AM

## 2020-07-02 ENCOUNTER — Ambulatory Visit: Payer: MEDICARE

## 2020-07-05 ENCOUNTER — Ambulatory Visit: Payer: MEDICARE

## 2020-07-05 ENCOUNTER — Ambulatory Visit: Payer: MEDICAID

## 2020-07-06 ENCOUNTER — Telehealth: Payer: MEDICAID

## 2020-07-06 ENCOUNTER — Ambulatory Visit: Payer: PRIVATE HEALTH INSURANCE

## 2020-07-06 ENCOUNTER — Ambulatory Visit: Payer: MEDICAID

## 2020-07-06 DIAGNOSIS — Z9189 Other specified personal risk factors, not elsewhere classified: Secondary | ICD-10-CM

## 2020-07-06 DIAGNOSIS — T884XXS Failed or difficult intubation, sequela: Secondary | ICD-10-CM

## 2020-07-06 NOTE — Telephone Encounter
Left the patient a message to move forward with scheduling the injection on her request. Asked that she call back at [phone]. Noted that I have also sent a mychart message.

## 2020-07-07 ENCOUNTER — Ambulatory Visit: Payer: MEDICAID

## 2020-07-07 NOTE — Progress Notes
SLEEP MEDICINE CONSULTATION    PATIENT: Chelsea [name]  MRN: [id number]  DOB: Jun 30, 1960  DATE OF SERVICE: 07/07/2020  REFERRING PHYSICIAN: Clint Lipps, DO       CHIEF COMPLAINT: obstructive sleep apnea       Subjective:     Chelsea [name] is a 60 y.o. female who is referred to sleep clinic with known obstructive sleep apnea and chronic neck pain from trauma.  Cervical spinal surgery planning.  + history of difficult intubation. + diagnosis of obstructive sleep apnea in 2013 during work up for surgery, started on PAP but couldn't use it after 1 year.  For the past couple of years, she started having difficulty sleeping with choking and gasping for air.  She had repeat sleep study done in July 2021 at [facility] sleep lab and given AirCurve (picuture of machine provided).  It worked well until she started gabapentin for pain (1800mg /24 hrs) for pain management.  She started snoring through her PAP machine and gasping for air, interrupting her sleep.      + history of smoking 25 years ago, less than 5 pack year smoking history.  No cough. No phlegm production. No dyspnea on exertion. No wheezing. No chest tightness with activity.     Past Medical History:   Diagnosis Date   ??? Abnormal cardiovascular stress test 06/21/2020    06/21/2020 Preop exam showed abnormal stress test with no EKG e/o ischemia but e/o large anterior wall ischemia with recommendation for cardiac catheterization. Had second opinion with repeat exercise nuclear stress test showing no e/o ischemia and was recommended against cardiac catheterization. I recommended starting Atorvastatin 40 mg every day for her DM2, but patient wishes to repeat labs fir   ??? Diabetes mellitus (HCC/RAF) 02/20/2017    06/21/2020 Chronic, stable. Last HgA1c 6.6% on 06/03/19. C/w MFM 500 mg BID. Repeat HgA1c. Recommended starting Atorvastatin 40 mg every day but patient wishes to repeat labs first. Will need to clarify last retinopathy screening at next visit.    ??? Hypertension, essential 06/21/2020    06/21/2020 Chronic,s table. C/w Benicar 40/25 mg x1/2 tablet every day. Reassess at next in office appointment.    ??? Hypothyroidism 06/21/2020   ??? Myelopathy concurrent with and due to spinal stenosis of cervical region (HCC/RAF) 06/21/2020    06/21/2020 Radiculopathy and myelopathy symptoms since forehead injury 08/25/19. MRI cervical spine 09/30/19 showing multilevel degenerative changes and central disk/osteophyte protrusion impinging the spinal cord with severe spinal stenosis and severe BL foraminal stenosis. Originally recommended for surgical intervention, which was then cancelled due to abnormal preoperative stress test. Neurosurge   ??? OSA (obstructive sleep apnea) 06/21/2020    06/21/2020 Chronic, stable. C/w CPAP at bedtime.      Past Surgical History:   Procedure Laterality Date   ??? CESAREAN SECTION      x2   ??? CHOLECYSTECTOMY  1987   ??? INCISIONAL HERNIA REPAIR  1987     No family history on file.  Social History     Socioeconomic History   ??? Marital status: Single     Spouse name: Not on file   ??? Number of children: Not on file   ??? Years of education: Not on file   ??? Highest education level: Not on file   Occupational History   ??? Not on file   Tobacco Use   ??? Smoking status: Never Smoker   ??? Smokeless tobacco: Never Used   Substance and Sexual Activity   ??? Alcohol use: Yes   ???  Drug use: Not on file   ??? Sexual activity: Not on file   Other Topics Concern   ??? Not on file   Social History Narrative   ??? Not on file     Social Determinants of Health     Physical Activity: Not on file   Stress: Not on file   Financial Resource Strain: Not on file     Current Outpatient Medications   Medication Sig   ??? atorvastatin 20 mg tablet Take 1 tablet (20 mg total) by mouth daily.   ??? ciclopirox 8% solution Apply topically at bedtime Apply to adjacent skin and affected nails daily. Remove with alcohol every 7 days. Use up to 48 weeks.Marland Kitchen   ??? gabapentin 100 mg capsule    ??? gabapentin 300 mg capsule take 1 capsule by mouth five times a day   ??? levothyroxine 100 mcg tablet take 1 tablet by mouth every morning before breakfast   ??? methylPREDNISolone 4 mg tablet pack Take as directed on package on package.   ??? olmesartan-hydroCHLOROthiazide 40-25 mg tablet Take 0.5 tablets by mouth daily.     No current facility-administered medications for this visit.     Iodinated diagnostic agents      Review of Systems:   As above.        Objective:     PHYSICAL EXAM:    Vitals: BP 118/74  ~ Pulse 58  ~ Temp 36.6 ???C (97.8 ???F) (Forehead)  ~ Ht 5' 2'' (1.575 m)  ~ Wt (!) 259 lb 3.2 oz (117.6 kg)  ~ SpO2 99%  ~ BMI 47.41 kg/m???       Head: normocephalic and atraumatic.   Eyes: Pupils are reactive. Sclerae are nonicteric.   Nose: + boggy and erythematous turbinates with no discharge  Oropharynx: Mallampati III  Neck: supple, no JVD.  Heart: RRR, no S3  Lungs: bilateral moderate air movement. No forced expiratory wheeze. No crackles.  Abd: + BS, no distension, + panus  Ext: no c/c, or edema.   Skin: no rash or ecchymosis  Neuro: CN grossly intact.  Good motor strength in all extremities.  Gait is steady    Any test reviews:    Echocardiogram: 10/29/97    CLINICAL PROBLEM: 60 year old female with h/o increasing le edema.  SPECIFIC QUESTION: Eval systolic function.  FINDINGS  1. Technically difficult echocardiogram; left ventricular endocardiumnot well seen. Normal left ventricular size, wall thickness, wall motion and systolic function; estimated left ventricular ejection  fraction is 60-65 %.  2. Normal right ventricular size and systolic function.  3. Normal atrial sizes. Normal inferior vena cava size with normal respiratory changes is consistent with normal right atrial pressure.  4. Aortic valve morphology not well seen. Peak aortic valve velocity is approximately 1.7 m/sec. No aortic regurgitation.  5. Mild focal thickened mitral valve. Mitral valve E-velocity is 99cm/sec; peak mitral valve A-velocity is 78 cm/sec; mitral valve isovolumic relaxation time is 75 msec. Mitral valve deceleration time of 202 msec is within normal limits. No mitral regurgitation.  6. Minimal tricuspid regurgitation. Tricuspid regurgitant velocity is  2.3 m/sec. Estimated right ventricular systolic pressure is 31 mm Hg (if right atrial pressure is 10 mm Hg) which is consistent with normal pulmonary artery pressure.  7. No pulmonic valve regurgitation. Time to peak velocity of right ventricular outflow tract is approximately 167 msec which is consistent with normal pulmonary artery pressure.  CONCLUSIONS  1. Technically difficult echocardiogram; grossly normal left ventricular systolic  function; left ventricular endocardium not  well seen.  2. Mildly thickened mitral valve.  3. Minimal tricuspid regurgitation.        Assessment:     Obstructive sleep apnea of undetermined severity. + on AirCurve ASV.  Hypertension  Increased BMI  Acute on chronic cervical pain on gabapentin.    Plan/Recommendation:     Need remote access of patient's PAP data to assess whether leak from old PAP mask is causing patient's symptoms of snoring or gabapentin has increased need for higher PAP pressure for relief of obstruction or both.  Sample PAP mask given to patient.   Encourage continued PAP use in the interim.     Follow up: 1 month to follow up on PAP efficacy, anticipate remotely adjusting patient's PAP settings.       Author: Ashley Jacobs, MD 07/07/2020 8:19 AM    Sleep questionnaire filled out by the patient reviewed and signed.    Time spent on patient counseling, reviewing data, coordination of care and documentation, 60 minutes.

## 2020-07-08 ENCOUNTER — Inpatient Hospital Stay: Payer: BLUE CROSS/BLUE SHIELD

## 2020-07-08 ENCOUNTER — Ambulatory Visit: Payer: MEDICAID

## 2020-07-08 ENCOUNTER — Telehealth: Payer: MEDICAID

## 2020-07-08 DIAGNOSIS — M542 Cervicalgia: Secondary | ICD-10-CM

## 2020-07-08 DIAGNOSIS — M4722 Other spondylosis with radiculopathy, cervical region: Secondary | ICD-10-CM

## 2020-07-08 DIAGNOSIS — M4712 Other spondylosis with myelopathy, cervical region: Secondary | ICD-10-CM

## 2020-07-08 DIAGNOSIS — G8929 Other chronic pain: Secondary | ICD-10-CM

## 2020-07-08 NOTE — Telephone Encounter
Call Back Request      Reason for call back: Patient is calling in and saw Dr. Jeris Penta yesterday and she wanted to have patient send in her C-PAP machine readings and will like to know if she is able to send it from her machine. Please call her today to assist. Thank you    Any Symptoms:  []  Yes  [x]  No       If yes, what symptoms are you experiencing:    o Duration of symptoms (how long):    o Have you taken medication for symptoms (OTC or Rx):      Patient or caller has been notified of the 24-48 hour turnaround time.

## 2020-07-09 ENCOUNTER — Telehealth: Payer: MEDICAID

## 2020-07-09 ENCOUNTER — Ambulatory Visit: Payer: BLUE CROSS/BLUE SHIELD

## 2020-07-09 ENCOUNTER — Telehealth: Payer: BLUE CROSS/BLUE SHIELD

## 2020-07-09 DIAGNOSIS — M79673 Pain in unspecified foot: Secondary | ICD-10-CM

## 2020-07-09 DIAGNOSIS — M25579 Pain in unspecified ankle and joints of unspecified foot: Secondary | ICD-10-CM

## 2020-07-09 NOTE — Telephone Encounter
PDL Call to Practice    Reason for Call: Pt had a new pt appt on 01/19, but did not bring her CPAP machine with her. Per pt she is still having trouble breathing at night with machine, pt would like to bring in machine today if possible.    Appointment Related?  [x]  Yes  []  No     If yes;  Date: Sameday  Time:     Call warm transferred to PDL: []  Yes  [x]  No    Call Received by Practice Representative: Kim     Scheduled pt with RT on 07/12/20.

## 2020-07-09 NOTE — Telephone Encounter
Call Back Request      Reason for call back: Pt is requesting for Dr. Marchia Bond to review the images that were taken by Dr. Minna Antis on 1/13, is requesting for a prescription to be sent pt is aware that he may or may not prescribe medication without a consult.    Any Symptoms:  []  Yes  [x]  No       If yes, what symptoms are you experiencing:    o Duration of symptoms (how long):    o Have you taken medication for symptoms (OTC or Rx):      Patient or caller has been notified of the 24-48 hour turnaround time.

## 2020-07-09 NOTE — Progress Notes
CARDIOLOGY OUTPATIENT CONSULTATION NOTE    PRIMARY CARE PROVIDER: Clint Lipps, DO    REFERRED BY: Clint Lipps, DO  [address]  [place],  [place] [address]    Chelsea [name]  [id number]  60 y.o.  1961-04-05      REASON FOR CONSULTATION/REFERRAL: Abnormal Cardiac Stress Test and Cardiovascular Assessment     HISTORY OF PRESENT ILLNESS: Chelsea [name] is a 60 y.o. patient with history of DM, HTN, Hypothyroidism, Cervical spinal myelopathy, OSA, who presents to cardiology for Abnormal Cardiac Stress Test and Cardiovascular Assessment       Last seen by Dr. Ricki Miller in 1/22, who was thoughtful towards the patient's care to place in a referral and per notes  ''Had abnormal stress as part of preop exam with cardiology showing no ischemic EKG changes but e/o large anterior wall ischemia. Recommended for angiogram and starting ASA 81 mg every day. She was seen by a second cardiologist who repeated an exercise nuclear stress test with no evidence of ischemia and recommended stopping aspirin and no need for angiogram. She would like a second opinion at [facility]. Denies CP, SOB, orthopnea. Has mild BL LE edema.''    Today, pt states that she came out to [place] in 1983, but originally from [place], but here with kids 3, 7 grandchildren, 1 ggc all between [place] and [place]. 3 younger kids and great grandbaby 4 here in [place]. [place] in 09/2020 plan vacation and meet there with everyone. Son and DIL [place] ot go [place]. States the issues with back and forth stress tests results have been frustating and provided eduation regarding all tests and possible interpretiaons.       PAST MEDICAL HISTORY:  Past Medical History:   Diagnosis Date   ??? Abnormal cardiovascular stress test 06/21/2020    06/21/2020 Preop exam showed abnormal stress test with no EKG e/o ischemia but e/o large anterior wall ischemia with recommendation for cardiac catheterization. Had second opinion with repeat exercise nuclear stress test showing no e/o ischemia and was recommended against cardiac catheterization. I recommended starting Atorvastatin 40 mg every day for her DM2, but patient wishes to repeat labs fir   ??? Diabetes mellitus (HCC/RAF) 02/20/2017    06/21/2020 Chronic, stable. Last HgA1c 6.6% on 06/03/19. C/w MFM 500 mg BID. Repeat HgA1c. Recommended starting Atorvastatin 40 mg every day but patient wishes to repeat labs first. Will need to clarify last retinopathy screening at next visit.    ??? Hypertension, essential 06/21/2020    06/21/2020 Chronic,s table. C/w Benicar 40/25 mg x1/2 tablet every day. Reassess at next in office appointment.    ??? Hypothyroidism 06/21/2020   ??? Myelopathy concurrent with and due to spinal stenosis of cervical region (HCC/RAF) 06/21/2020    06/21/2020 Radiculopathy and myelopathy symptoms since forehead injury 08/25/19. MRI cervical spine 09/30/19 showing multilevel degenerative changes and central disk/osteophyte protrusion impinging the spinal cord with severe spinal stenosis and severe BL foraminal stenosis. Originally recommended for surgical intervention, which was then cancelled due to abnormal preoperative stress test. Neurosurge   ??? OSA (obstructive sleep apnea) 06/21/2020    06/21/2020 Chronic, stable. C/w CPAP at bedtime.        PAST SURGICAL HISTORY:  Past Surgical History:   Procedure Laterality Date   ??? CESAREAN SECTION      x2   ??? CHOLECYSTECTOMY  1987   ??? INCISIONAL HERNIA REPAIR  1987       ALLERGIES:  Allergies   Allergen Reactions   ???  Iodinated Diagnostic Agents Anaphylaxis       CURRENT MEDICATIONS:  No current facility-administered medications for this visit.       SOCIAL HISTORY:  Social History     Socioeconomic History   ??? Marital status: Single     Spouse name: Not on file   ??? Number of children: Not on file   ??? Years of education: Not on file   ??? Highest education level: Not on file   Occupational History   ??? Not on file   Tobacco Use   ??? Smoking status: Never Smoker   ??? Smokeless tobacco: Never Used   Substance and Sexual Activity   ??? Alcohol use: Yes   ??? Drug use: Not on file   ??? Sexual activity: Not on file   Other Topics Concern   ??? Not on file   Social History Narrative   ??? Not on file     Social Determinants of Health     Physical Activity: Not on file   Stress: Not on file   Financial Resource Strain: Not on file         FAMILY HISTORY:  No family history on file.    REVIEW OF SYSTEMS  Comprehensive 14-point review of systems was conducted and negative except for those points indicated in the HPI.     PHYSICAL EXAMINATION:  Last Recorded Vital Signs:    07/13/20 1417   BP: 121/79   Pulse: 59   Resp: 20   Temp: 36.1 ???C (97 ???F)   SpO2: 100%     General: NAD, AAOx4  HEENT: Anicteric, oropharynx clear, moist mucous membranes  Neck: JVP 8. No lymphadenopathy, supple, no obvious lymphadenopathy  Heart: regular rate and rhythm. No rubs or murmurs  Lungs: course breath sounds bilaterally, no intercostal retractions  Abd: +BS, NTND, no obvious splenomegaly on exam.   Extremities: no clubbing, cyanosis, or edema  Skin: warm, dry , and well perfused, normal tugor.   Neuro: grossly non focal  Psych: Pleasant, conversant, appropriate insight and orientation     DIAGNOSTIC STUDIES:  Lab Results   Component Value Date    NA 139 06/29/2020    K 4.0 06/29/2020    CL 102 06/29/2020    CO2 27 06/29/2020       Lab Results   Component Value Date    WBC 6.09 06/29/2020    HGB 13.1 06/29/2020    HCT 43.7 06/29/2020    MCV 93.8 06/29/2020    PLT 263 06/29/2020       Last 3 Lipids (Up to last 3 results from the past 16109 hours)      01/11 0819    Cholesterol       185  Comment: The significance of total cholesterol depends on the values of LDL, HDL, triglycerides and the clinical context. A patient-provider discussion may be considered.         Cholesterol, HDL       44  Comment: If HDL cholesterol level falls outside of the designatedrange, a patient-provider discussion is recommended         Triglycerides       107  Comment: If Triglyceride level falls outside of the designated range,a patient-provider discussion is recommended.         Non-HDL,Chol,Calc       141  Comment: If Non-HDL cholesterol level falls outside of the designatedrange, a patient-provider discussion is recommended.  Lab Results   Component Value Date    ALT 20 06/29/2020    AST 16 06/29/2020    ALKPHOS 85 06/29/2020    BILITOT 0.3 06/29/2020       Lab Results   Component Value Date    HGBA1C 6.7 (H) 06/29/2020       Future Appointments   Date Time Provider Department Center   07/12/2020 11:15 AM Janeann Forehand, MD, MS DRM COSM [id number] MEDICINE   07/12/2020  2:00 PM SM PULM, PFT [id number] MEDICINE   07/13/2020  2:20 PM Miquel Dunn., MD CARD [id number] MEDICINE   07/15/2020  1:00 PM [facility]AS MR 01 3T CIC MR CAL ENC WLV   08/16/2020 11:00 AM Romeroso, Jillyn Hidden., MD [id number] OBGYN [facility]   09/16/2020  2:30 PM Mcarthur Rossetti., MD [id number]-SM OPH   09/30/2020 10:15 AM Clint Lipps, DO INTMED [id number] MEDICINE     10/1997 TTE  1. Technically difficult echocardiogram; left ventricular endocardium  not well seen. Normal left ventricular size, wall thickness, wall  motion and systolic function; estimated left ventricular ejection  fraction is 60-65 %.  2. Normal right ventricular size and systolic function.  3. Normal atrial sizes. Normal inferior vena cava size with normal  respiratory changes is consistent with normal right atrial  pressure.  4. Aortic valve morphology not well seen. Peak aortic valve velocity  is approximately 1.7 m/sec. No aortic regurgitation.  5. Mild focal thickened mitral valve. Mitral valve E-velocity is 99  cm/sec; peak mitral valve A-velocity is 78 cm/sec; mitral valve  isovolumic relaxation time is 75 msec. Mitral valve deceleration  time of 202 msec is within normal limits. No mitral regurgitation.  6. Minimal tricuspid regurgitation. Tricuspid regurgitant velocity is  2.3 m/sec. Estimated right ventricular systolic pressure is 31 mm  Hg (if right atrial pressure is 10 mm Hg) which is consistent with  normal pulmonary artery pressure.  7. No pulmonic valve regurgitation. Time to peak velocity of right  ventricular outflow tract is approximately 167 msec which is  consistent with normal pulmonary artery pressure.  CONCLUSIONS  1. Technically difficult echocardiogram; grossly normal left  ventricular systolic function; left ventricular endocardium not  well seen.  2. Mildly thickened mitral valve.  3. Minimal tricuspid regurgitation.    OSH 10/21 NM stress  Normal exercise stress myocardial perfusion imaging. ???   The patient had no symptoms or EKG changes during exercise achieving 102%   of MPHR.   Gated SPECT imaging revealed normal myocardial perfusion and function with   no evidence of inducible ischemia    OSH 8/21 TTE  ??? ???Mild concentric left ventricular hypertrophy with normal systolic   function   ??? ???Normal diastolic function   ??? ???Mild left atrial enlargement   ??? ???Trace tricuspid regurgitation and normal pulmonary pressures    1/22 ECG  - sinus brady HR 58, QTC 410, no significant arrhythmias or prior MI or active ischemic changes noted      ASSESSMENT/PLAN:   Onita Pfluger is a 60 y.o. patient with history of DM, HTN, Hypothyroidism, Cervical spinal myelopathy, OSA, who presents to cardiology for Abnormal Cardiac Stress Test and Cardiovascular Assessment     Abnormal Cardiac Stress Test and Cardiovascular Assessment   - ''Had abnormal stress as part of preop exam with cardiology showing no ischemic EKG changes but e/o large anterior wall ischemia. Recommended for angiogram and starting ASA 81 mg every day. She was seen by a second cardiologist who repeated  an exercise nuclear stress test with no evidence of ischemia and recommended stopping aspirin and no need for angiogram''  - Prior 9/21 NM stress report noted large anterior infarct area but then NM stress done again (results above in 10/21) which were unremarkable. Only able to view reports, but given habitus, possible that anterior breast attenuation, if not corrected for correctly on an NM study, can result in an anterior infarct pattern, especially if images were not corrected for gender/habitus. However, no images available for review, but requested pt to provided images on CD if possible.   - However, given concerns and distraught of pt regarding these discrepant results, discussed options regarding to have a final 3rd confirmatory test and discussed various stress testing options and risks and benefits of each (TTE stress echo, CCTA, NM stress, LHC, etc) and will repeat NM stress per pt request to clarify findings.     #Hypothyroidism  - S/p RAI .   - On Levothyroxine 100 mcg every day  - TSH WNL 1/22.   ???  #DM2  - Last HgA1c 6.7% on 06/29/20  - COnt metformin.    ???  #HTN  - SBP in clinic today 120s.  - Cont benicar  ???  #Obesity  - Chronic, stable  - Recommend cardiac healthy diet, exercise, and weight recommendations: eat an overall healthy dietary pattern similar to DASH (Dietary Approaches to Stop Hypertension) eating plan and aim for at least 150 minutes of moderate physical activity (aerobic) or 75 minutes of vigorous physical activity (or an equal combination of both) each week for weight loss as well as to improve physical and cardiovascular fitness, if possible. Also recommend education about intermittent fasting.     ???  #OSA  - Cont CPAP qHS      Return to clinic in 1-2 weeks to go over results of studies (NM stress treadmill) and discuss next steps (or sooner PRN)    Thank you for your referral Dr. Ricki Miller!      The above plan of care, diagnosis, orders, and follow-up were discussed with the patient. I personally reviewed labs/imaging/ECG/telemetry/hemodynamics/cardiodiagnostic studies in depth. 61 minutes were spent in combination counseling patient and/or coordinating care, and in prior note and chart review of both cardiac and non-cardiac notes and studies, and analysis for medical decision making. Questions related to this recommended plan of care were answered.     Miquel Dunn, MD  [facility]

## 2020-07-09 NOTE — Addendum Note
Addended by: Chana Bode on: 07/09/2020 11:14 AM     Modules accepted: Orders

## 2020-07-10 NOTE — Telephone Encounter
Forwarded by: Biagio Quint  Needs appt

## 2020-07-12 ENCOUNTER — Ambulatory Visit: Payer: PRIVATE HEALTH INSURANCE

## 2020-07-12 ENCOUNTER — Ambulatory Visit: Payer: BLUE CROSS/BLUE SHIELD

## 2020-07-12 MED ORDER — MELOXICAM 15 MG PO TABS
15 mg | ORAL_TABLET | Freq: Every day | ORAL | 9 refills | Status: AC
Start: 2020-07-12 — End: ?

## 2020-07-12 NOTE — Addendum Note
Addended by: Chana Bode on: 07/12/2020 11:17 AM     Modules accepted: Orders

## 2020-07-12 NOTE — Progress Notes
PATIENT: Chelsea [name]  MRN: [id number]  DOB: [birth year]  DATE OF SERVICE: 07/12/2020    REFERRING PRACTITIONER: Ashley Jacobs., MD  PRIMARY CARE PROVIDER: Clint Lipps, DO  PRIMARY SLEEP PROVIDER: Ashley Jacobs., MD    Initial PAP Setup Appointment (1 hour)  Primary Complaint: Obstructive Sleep Apnea  DME: [employer]  Name of Machine and Settings: ResMed AirCurve 10 VAuto  Min EPAP: 8.0 (cmH2O)  Max IPAP: 22.2 (cmH2O)  Pressure support: 0.0 (cmH2O)  RAMP: on  FLEX/EPR: n/a  PRESSURE CHECK (yes/no): no  Approximate Date of Receiving machine: 07/26/2017  Name and Type of Mask(s): Resmed Airfit N20 Nasal mask, Large Cushion -- current  Fitted and given Respironics Wisp Nasal Mask, Large cushion, for trial  Does patient use/need chin strap? (yes/no): no  What Type of Tube/Hose? (Standard/Heated Coil): Standard  Is the PAP connected to the cloud? (Y/N) yes    Data Range: See attached compliance report below  ___________________________________________________________________________  Overview of PAP Therapy/ Importance of PAP therapy (yes/no): yes  Check condition of Filter, Humidifier and Tubing (yes/no): yes  Instructions on how to clean and maintain equipment (yes/no):yes  Check Mask and Adjust for comfort (yes/no):yes  Fitting of Mask, give samples if appropriate (yes/no):yes  Download SD card or Remote Cloud access (if data available):wireless  ____________________________________________________________________________  Explanation of Pressure Relief Function (EPR/+Flex):n/a  Check for Ramp setting and explanation of Ramp function (yes/no):yes  Show how to look for usage data for personal tracking (Remote/Onboard):yes  ____________________________________________________________________________  Review the Patient???s experience with PAP to date (Note here of secondary complaint (ie, day time sleepiness, leaks, mask discomfort):  Pt presents to clinic with PAP equipment; compliance report was discussed and PAP device was connected to wireless access; pt complains of current mask discomfort and reports not getting enough air from the device; fitted and given Respironics Wisp Nasal Mask, Large cushion, for trial; also, Ramp was turned off, but pt still reports not getting enough air; instructed to follow up with MD asap to adjust pressure settings; encouraged to keep using PAP and reminded the importance of using it for more than 4 hours every night; reviewed compliance guidelines, cleaning, replacing, and ordering supplies; given overview of machine including ramp, mask, tubing connections and humidity settings;  ____________________________________________________________________________  Review Optimal Sleep Hygiene and Desensitization Techniques (yes/no):yes  Travel considerations (yes/no):yes  Reminder to always bring Data Card to MD visit and Machine and Equipment for RT visit  (yes/no):yes  Explanation of Equipment replacement Schedule, Compliance Rules, 30 days mask exchange, Rx for supplies (yes/no):yes  Rx submitted (yes/no):yes  Verify Patient understanding and Provide Action Plan/ Scheduling (yes/no):yes  Does the Patient express that they will be able to use the PAP Consistently? (yes/no) :yes    If Yes, Schedule regular F/U for MD/RT: RT follow up in 6 months or sooner if needed    If No, Expedited MD F/U:                                    Author:  Barbie Haggis 07/12/2020 3:14 PM

## 2020-07-13 ENCOUNTER — Ambulatory Visit: Payer: MEDICAID | Attending: Cardiovascular Disease

## 2020-07-13 ENCOUNTER — Telehealth: Payer: PRIVATE HEALTH INSURANCE

## 2020-07-13 DIAGNOSIS — Z136 Encounter for screening for cardiovascular disorders: Secondary | ICD-10-CM

## 2020-07-13 DIAGNOSIS — E890 Postprocedural hypothyroidism: Secondary | ICD-10-CM

## 2020-07-14 DIAGNOSIS — Z0389 Encounter for observation for other suspected diseases and conditions ruled out: Secondary | ICD-10-CM

## 2020-07-14 NOTE — Telephone Encounter
Patient stated that she does not have [employer] she dis enrolled fron that insurance,

## 2020-07-14 NOTE — Telephone Encounter
Message to Practice/Provider      Message: Chelsea [name] from [facility] states the patient's referral to see Dr. Lajuan Lines is being denied because he is out of network so she will be referred to [facility] with a physician that is in network.    Return call is not being requested by the patient or caller.    Patient or caller has been notified of the 24-48 hour processing turnaround time if applicable.

## 2020-07-15 ENCOUNTER — Inpatient Hospital Stay: Payer: MEDICAID

## 2020-07-15 ENCOUNTER — Inpatient Hospital Stay: Payer: BLUE CROSS/BLUE SHIELD

## 2020-07-15 DIAGNOSIS — G549 Nerve root and plexus disorder, unspecified: Secondary | ICD-10-CM

## 2020-07-15 NOTE — Addendum Note
Addended by: Deliah Boston on: 07/14/2020 04:29 PM     Modules accepted: Orders, SmartSet

## 2020-07-16 ENCOUNTER — Telehealth: Payer: MEDICAID

## 2020-07-16 ENCOUNTER — Telehealth: Payer: BLUE CROSS/BLUE SHIELD

## 2020-07-16 DIAGNOSIS — E890 Postprocedural hypothyroidism: Secondary | ICD-10-CM

## 2020-07-16 DIAGNOSIS — E119 Type 2 diabetes mellitus without complications: Secondary | ICD-10-CM

## 2020-07-16 DIAGNOSIS — M25551 Pain in right hip: Secondary | ICD-10-CM

## 2020-07-16 DIAGNOSIS — M25552 Pain in left hip: Secondary | ICD-10-CM

## 2020-07-16 MED ORDER — METHYLPREDNISOLONE 4 MG PO TBPK
ORAL_TABLET | 0 refills | Status: AC
Start: 2020-07-16 — End: ?

## 2020-07-16 NOTE — Progress Notes
PATIENT: Chelsea [name]  MRN: [id number]  DOB: Apr 12, 1961  DATE OF SERVICE: 07/16/2020    CHIEF COMPLAINT:   Chief Complaint   Patient presents with   ??? Follow-up   ??? Results        HPI   Chelsea [name] is a 60 y.o. female who  has a past medical history of Abnormal cardiovascular stress test (06/21/2020), Diabetes mellitus (HCC/RAF) (02/20/2017), Hypertension, essential (06/21/2020), Hypothyroidism (06/21/2020), Myelopathy concurrent with and due to spinal stenosis of cervical region (HCC/RAF) (06/21/2020), and OSA (obstructive sleep apnea) (06/21/2020). who presents for   Chief Complaint   Patient presents with   ??? Follow-up   ??? Results     #Cervical Radiculopathy with myelopathy  Onset since forehead injury 08/25/2019. Pain at neck with radiation down right arm and BL shoulders. Sleeps in neck brace. Lying supine worsens the pain. Associated muscle weakness and numbness in BL hands - can't open jars or buckle seat belts. Currently taking Gabapentin, which helps make the pain the manageable. Was originally planning on surgery but then had abnormal stress test as part of preop exam as detailed below.   07/06/2020 Chronic, stable. Seen by neurosurg Dr. Konrad Felix who recommended repeating MRI in 1 month and possible plans for surgical intervention. Seen by PMR who ordered medrol dose pack and consider ESI.   07/16/2020 Chronic, stable. In a lot of pain this morning. On Meloxicam 15 mg every day and Gabapentin.     Now with pain in BL hips since 2 weeks ago. Using egg crate on bed, which helps. Denies radiation of the pain. Has numbness & tingling at BL hands and feet. Denies muscle weakness of LE.   ???  #Hypothyroidism  S/p RAI in 30's. Currently on Levothyroxine 100 mcg every day. TSH 2.32 on 12/08/19.  07/16/2020 Chronic, stable. Euthyroid on labs from 06/29/20.   ???  #DM2  Diagnosed with HgA1c 9%. Currently on MFM 500 mg BID. Last HgA1c 6.6% on 06/03/19.  07/06/2020 Last HgA1c 6.7% on 06/29/20. Negative UACR. Patient now reports that she is not taking any diabetic medications but she has lost 50 lbs since her initial diagnosis.   ???  ???  #Obesity  Chronic, stable.   ???    #HLD  2018 ACC/AHA guidelines recommends moderate or high-intensity statin because of diabetes. Patient is receiving guideline-directed statin therapy; monitor adherence.  10-Year ASCVD risk is 13.3% Continue moderate or high-intensity statin therapy. Monitor adherence as of 2:31 PM on 07/16/2020.  10-Year ASCVD risk with optimal risk factors is 2.4%.  Values used to calculate ASCVD score:  Age: 59 y.o.   Gender: Female Race: Black  HDL cholesterol: 44 mg/dL. HDL cholesterol measured on 06/29/2020.  Total cholesterol: 185 mg/dL. Total cholesterol measured on 06/29/2020.  LDL cholesterol: 120 mg/dL. LDL cholesterol measured on 06/29/2020.  Systolic BP: 121 mm Hg. BP was measured on 07/13/2020.  The patient is being treated with a medication that influences SBP.  The patient is currently not a smoker.  The patient has a diagnosis of diabetes.  Click here for the [facility] ASCVD Cardiovascular Risk Estimator Plus tool Office manager).  ???  #Abnormal Cardiac Stress Test  Had abnormal stress as part of preop exam with cardiology showing no ischemic EKG changes but e/o large anterior wall ischemia. Recommended for angiogram and starting ASA 81 mg every day. She was seen by a second cardiologist who repeated an exercise nuclear stress test with no evidence of ischemia and recommended stopping aspirin and no need  for angiogram. She would like a second opinion at [facility]. Denies CP, SOB, orthopnea. Has mild BL LE edema.   07/16/2020 Established care with Cardiology Dr. Theodoro Grist who ordered NM stress test.    Not addressed this visit:  #OSA  Remains on CPAP at bedtime    #HTN  Chronic, stable. Benicar 40/25 mg x1/2 tablet every day  Patient Care Team:  Clint Lipps, DO as PCP - General (Family Medicine)    MEDS     Outpatient Medications Prior to Visit   Medication Sig   ??? ciclopirox 8% solution Apply topically at bedtime Apply to adjacent skin and affected nails daily. Remove with alcohol every 7 days. Use up to 48 weeks.Marland Kitchen   ??? gabapentin 100 mg capsule    ??? gabapentin 300 mg capsule take 1 capsule by mouth five times a day   ??? levothyroxine 100 mcg tablet take 1 tablet by mouth every morning before breakfast   ??? meloxicam 15 mg tablet Take 1 tablet (15 mg total) by mouth daily.   ??? olmesartan-hydroCHLOROthiazide 40-25 mg tablet Take 0.5 tablets by mouth daily.   ??? atorvastatin 20 mg tablet Take 1 tablet (20 mg total) by mouth daily. (Patient not taking: Reported on 07/07/2020.)   ??? methylPREDNISolone 4 mg tablet pack Take as directed on package on package. (Patient not taking: Reported on 07/07/2020.)     No facility-administered medications prior to visit.       PHYSICAL EXAM      Last Recorded Vital Signs:     Body mass index is 47.55 kg/m???.  Wt Readings from Last 3 Encounters:   07/16/20 (!) 260 lb (117.9 kg)   07/13/20 (!) 259 lb (117.5 kg)   07/07/20 (!) 259 lb 3.2 oz (117.6 kg)      Gen - Awake. NAD.    Pulm - Nml effort. Speaking in full sentences. No e/o respiratory distress.  Derm - Nml appearance and color.  MSK - C-collar in place  Neuro - AAOx3.   Psych - Nml mood and affect. No tangential thinking or pressured speech.       LABS/STUDIES   I have:   []  Reviewed/ordered []  1 []  2 []  ? 3 unique laboratory, radiology, and/or diagnostic tests noted below    []  Reviewed []  1 []  2 []  ? 3 prior external notes and incorporated into patient assessment    []  Discussed management or test interpretation with external provider(s) as noted       Lab Studies:  CBC:   Results for orders placed or performed in visit on 06/29/20   CBC   Result Value Ref Range    White Blood Cell Count 6.09 4.16 - 9.95 x10E3/uL    Red Blood Cell Count 4.66 3.96 - 5.09 x10E6/uL    Hemoglobin 13.1 11.6 - 15.2 g/dL    Hematocrit 16.1 09.6 - 45.2 %    Mean Corpuscular Volume 93.8 79.3 - 98.6 fL    Mean Corpuscular Hemoglobin 28.1 26.4 - 33.4 pg    MCH Concentration 30.0 (L) 31.5 - 35.5 g/dL    Red Cell Distribution Width-SD 44.5 36.9 - 48.3 fL    Red Cell Distribution Width-CV 12.9 11.1 - 15.5 %    Platelet Count, Auto 263 143 - 398 x10E3/uL    Mean Platelet Volume 10.3 9.3 - 13.0 fL    Nucleated RBC%, automated 0.0 No Ref. Range %    Absolute Nucleated RBC Count 0.00 0.00 - 0.00 x10E3/uL  Neutrophil Abs (Prelim) 3.78 See Absolute Neut Ct. x10E3/uL   Differential, Automated   Result Value Ref Range    Neutrophil Percent, Auto 62.0 No Ref. Range %    Lymphocyte Percent, Auto 30.4 No Ref. Range %    Monocyte Percent, Auto 4.8 No Ref. Range %    Eosinophil Percent, Auto 1.8 No Ref. Range %    Basophil Percent, Auto 0.8 No Ref. Range %    Immature Granulocytes% 0.2 No Reference Range %    Absolute Neut Count 3.78 1.80 - 6.90 x10E3/uL    Absolute Lymphocyte Count 1.85 1.30 - 3.40 x10E3/uL    Absolute Mono Count 0.29 0.20 - 0.80 x10E3/uL    Absolute Eos Count 0.11 0.00 - 0.50 x10E3/uL    Absolute Baso Count 0.05 0.00 - 0.10 x10E3/uL    Absolute Immature Gran Count 0.01 0.00 - 0.04 x10E3/uL   CBC & Auto Differential    Narrative    The following orders were created for panel order CBC & Auto Differential.  Procedure                               Abnormality         Status                     ---------                               -----------         ------                     GNF[[id number]]                          Abnormal            Final result               Differential, Automated[[id number]]                          Final result                 Please view results for these tests on the individual orders.       CMP:   Results for orders placed or performed in visit on 06/29/20   Comprehensive Metabolic Panel   Result Value Ref Range    Sodium 139 135 - 146 mmol/L    Potassium 4.0 3.6 - 5.3 mmol/L    Chloride 102 96 - 106 mmol/L    Total CO2 27 20 - 30 mmol/L    Anion Gap 10 8 - 19 mmol/L    Glucose 110 (H) 65 - 99 mg/dL    Creatinine 8.46 9.62 - 1.30 mg/dL    GFR Estimate for African American >89 See GFR Additional Information mL/min/1.20m2    GFR Estimate for Non-African American >89 See GFR Additional Information mL/min/1.38m2    GFR Additional Information See Comment     Urea Nitrogen 12 7 - 22 mg/dL    Calcium 9.3 8.6 - 95.2 mg/dL    Total Protein 6.9 6.1 - 8.2 g/dL    Albumin 4.0 3.9 - 5.0 g/dL    Bilirubin,Total 0.3 0.1 - 1.2 mg/dL    Alkaline Phosphatase 85 37 - 113 U/L  Aspartate Aminotransferase 16 13 - 62 U/L    Alanine Aminotransferase 20 8 - 70 U/L     Hgb A1C:   Lab Results   Component Value Date/Time    HGBA1C 6.7 (H) 06/29/2020 08:19 AM     Lipids:   Results for orders placed or performed in visit on 06/29/20   Lipid Panel   Result Value Ref Range    Cholesterol 185 See Comment mg/dL    Cholesterol,LDL,Calc 120 (H) <100 mg/dL    Cholesterol, HDL 44 (L) >50 mg/dL    Triglycerides 413 <244 mg/dL    Non-HDL,Chol,Calc 010 (H) <130 mg/dL      TSH:   Lab Results   Component Value Date/Time    TSH 2.2 06/29/2020 08:19 AM        Imaging Studies:   MRI Cervical Spine (07/15/20):    IMPRESSION:  ???  Moderate degenerative disc disease centered at C5-C6 with disc protrusion indenting the ventral spinal cord and causing moderate to severe spinal canal stenosis with mild increased T2 signal in the spinal cord, suggestive of cord edema.  ???  Moderate bilateral neural foraminal narrowing at C5-C6.    XR Cervical Spine (07/15/20):  IMPRESSION: Degenerative disease most prominent at C5-C6.    A&P   Chelsea [name] is a 60 y.o. female presenting for   Chief Complaint   Patient presents with   ??? Follow-up   ??? Results         PROBLEM & ORDERS    ICD-10-CM    1. Bilateral hip pain  M25.551 XR hip ap+lat bilat w pelvis (5 views)    M25.552    2. Type 2 diabetes mellitus without complication, without long-term current use of insulin (HCC/RAF)  E11.9 Referral to Clinical Nutrition   3. Morbid (severe) obesity due to excess calories (HCC/RAF)  E66.01 Referral to Clinical Nutrition   4. Myelopathy concurrent with and due to spinal stenosis of cervical region (HCC/RAF)  M48.02     G99.2    5. Abnormal cardiovascular stress test  R94.39    6. Postablative hypothyroidism  E89.0        ASSESSMENT  #BL Hip Pain  No radicular symptoms.  -Check plain films  -C/w Meloxicam 7.5 mg every day for pain control    Problem List Items Addressed This Visit        HCC Codes    ??? Diabetes mellitus (HCC/RAF)     Overview      06/21/2020 Chronic, stable. Last HgA1c 6.6% on 06/03/19. C/w MFM 500 mg BID. Repeat HgA1c. Recommended starting Atorvastatin 40 mg every day but patient wishes to repeat labs first. Will need to clarify last retinopathy screening at next visit.   07/06/2020 Chronic, stable. HgA1c 6.7% on 06/29/20 with negative UACR. Recommended resuming MFM XR 500 mg every day, declined at this time. Start Atorvastatin 20 mg every day with plans to uptitrate. Recommended pneumococcal vaccine, which was declined at this time. Ophtho referral for retinopathy screening.   07/16/2020 Chronic, stable. Did not resume MFM XR 500 mg every day. Reiterated my recommendation to restart MFM 500 mg every day. Recommended restarted Atorvastatin 20 mg every day.          ??? Morbid (severe) obesity due to excess calories (HCC/RAF)     Overview      Encouraged DASH/ADA or Mediterranean diet with appropriate caloric portioning and focus on lean meats and vegetables while limiting salt intake to < 2000 mg/day with low fat/low  sugar diet. Encouraged regular aerobic exercise for at least 15 mins/week for cardioprotective benefits and strength training at least 2 days per week. Discussed goal BMI < 30 for morbidity/mortality  benefits. Recommended bariatric surgery evaluation, which was declined at this time. Recommended resuming MFM for DM2, which was declined at this time. Consider GLP-1 agonist.   07/16/2020 Chronic, stable. Recommended resuming MFM 500 mg every day, which patient will consider. Clinical nutrition referral for dietician.          ??? Myelopathy concurrent with and due to spinal stenosis of cervical region (HCC/RAF)     Overview      06/21/2020 Radiculopathy and myelopathy symptoms since forehead injury 08/25/19. MRI cervical spine 09/30/19 showing multilevel degenerative changes and central disk/osteophyte protrusion impinging the spinal cord with severe spinal stenosis and severe BL foraminal stenosis. Originally recommended for surgical intervention, which was then cancelled due to abnormal preoperative stress test. Neurosurgery referral provided today. She has had a h/o difficult intubation in the past.   07/06/2020 Chronic, stable. Evaluated by neurosurg with plans to repeat MRI in 1 month and consider surgical intervention. Seen by PMR who prescribed medrol dose pack. C/w Gabapentin.   07/16/2020 Chronic, stable. MRI cervical spine with moderate to severe spinal canal stenosis and mild increased T2 signal suggestive of cord edema. Medrol dosepack and Meloxicam 7.5 mg every day + Gabapentin for pain control. Recommended urgent f/u with Neurosurg.             Other    ??? Abnormal cardiovascular stress test     Overview      06/21/2020 Preop exam showed abnormal stress test with no EKG e/o ischemia but e/o large anterior wall ischemia with recommendation for cardiac catheterization. Had second opinion with repeat exercise nuclear stress test showing no e/o ischemia and was recommended against cardiac catheterization. I recommended starting Atorvastatin 40 mg every day for her DM2, but patient wishes to repeat labs first. Cardiology referral at patient's request.   07/16/2020 Established care with Cardiology Dr. Theodoro Grist who ordered repeat NM stress test with thanks. Atorvastatin 20 mg every day as detailed in DM2 problem.          ??? Hypothyroidism     Overview      S/p RAI in 30's.  07/06/2020 Chronic, stable. Euthyroid on labs from 06/29/20. C/w current regimen of Levothyroxine 100 mcg every day.                  Diagnoses and all orders for this visit:    Bilateral hip pain  -     XR hip ap+lat bilat w pelvis (5 views); Future    Type 2 diabetes mellitus without complication, without long-term current use of insulin (HCC/RAF)  -     Referral to Clinical Nutrition    Morbid (severe) obesity due to excess calories (HCC/RAF)  -     Referral to Clinical Nutrition    Myelopathy concurrent with and due to spinal stenosis of cervical region (HCC/RAF)    Abnormal cardiovascular stress test    Postablative hypothyroidism    Other orders  -     methylPREDNISolone 4 mg tablet pack; Take as directed on package.      Orders Placed This Encounter   ??? XR hip ap+lat bilat w pelvis (5 views)   ??? Referral to Clinical Nutrition   ??? methylPREDNISolone 4 mg tablet pack       The above recommendation were discussed with the patient.  The patient has all questions answered satisfactorily and is in agreement with this recommended plan of care.      Clint Lipps, DO  Clinical Instructor  Department of Medicine  07/16/2020 2:31 PM

## 2020-07-16 NOTE — Telephone Encounter
Hello,    The patient is scheduled today 07/16/2020 at 2:00pm. The patient is not clear for the appointment, her Hn  insurance is assigned to: [facility], &Blue Shield ins is assigned to [place].  and not [facility]. We lvm for the patient at [phone]. If the patient proceeds she will be considered self-pay.    Please be advised at this time the patient is not clear for this appointment.    Thank you,  Fredrich Birks  Patient Communication Representative  Office: [phone] ~ Fax: [phone]

## 2020-07-19 ENCOUNTER — Telehealth: Payer: MEDICAID

## 2020-07-19 NOTE — Telephone Encounter
Call Back Request      Reason for call back: Pt is requesting to r/s her procedure on 2/9 to a morning time instead. Please advise     Any Symptoms:  []  Yes  [x]  No       If yes, what symptoms are you experiencing:    o Duration of symptoms (how long):    o Have you taken medication for symptoms (OTC or Rx):      Patient or caller has been notified of the 24-48 hour turnaround time.

## 2020-07-19 NOTE — Telephone Encounter
Call Back Request      Reason for call back: Patient called in to schedule a follow up visit with Dr.Vivas to go over her MRI results. Patient confirmed that she completed a video visit with Dr.Vivas on 06/23/20 however, on our end it's labeled as a ''no show''. Patient is also looking to be advised if she will require a referral for her appointment on 07/27/20. Please advise, thank you!   Any Symptoms:  []  Yes  [x]  No       If yes, what symptoms are you experiencing:    o Duration of symptoms (how long):    o Have you taken medication for symptoms (OTC or Rx):      Patient or caller has been notified of the 24-48 hour turnaround time.

## 2020-07-19 NOTE — Telephone Encounter
Reply by: Roselee Nova Markell Sciascia  Spoke to the patient. Re-reviewed procedure details and she will keep it as schedule.

## 2020-07-19 NOTE — Telephone Encounter
Patient states that she spoke with the [place] billing dept. And they told her that her insurance was ok that [facility] does take her insurance

## 2020-07-20 ENCOUNTER — Ambulatory Visit: Payer: PRIVATE HEALTH INSURANCE

## 2020-07-21 ENCOUNTER — Ambulatory Visit: Payer: MEDICAID

## 2020-07-21 ENCOUNTER — Ambulatory Visit: Payer: PRIVATE HEALTH INSURANCE

## 2020-07-22 ENCOUNTER — Telehealth: Payer: BLUE CROSS/BLUE SHIELD

## 2020-07-23 ENCOUNTER — Ambulatory Visit: Payer: MEDICAID | Attending: Foot & Ankle Surgery

## 2020-07-23 MED ADMIN — TECHNETIUM TC 99M TETROFOSMIN IV KIT: 13 | INTRAVENOUS | @ 19:00:00 | Stop: 2020-07-23

## 2020-07-23 MED ADMIN — TECHNETIUM TC 99M TETROFOSMIN IV KIT: 42 | INTRAVENOUS | @ 22:00:00 | Stop: 2020-07-23

## 2020-07-23 NOTE — Progress Notes
PATIENT:Chelsea [name]  KVQ:[id number]  DOB:01-28-1961  DATE OF SERVICE: 07/23/2020     Referring physician: Clint Lipps, DO    CHIEF COMPLAINT   Chronic painful flatfeet    HISTORY OF PRESENT ILLNESS   The patient is a pleasant 60 y.o. female who presents today complaining of chronic painful flatfeet.  Pain generally occurs with prolonged weightbearing activity. Pain is to the area where her arches should be. Inquires about recommendations for insoles and for supportive shoes. There is no history of recent trauma or change in activity. Notes that her ankles are chronically swollen. Also notes discoloration to toenails, similar problem to fingernails, applying ciclopirox 8% solution. Patient is diabetic and relates she experiences tingling in her toes, mostly noticeable at rest. Denies any prior foot complications related to diabetes.    PAST MEDICAL HISTORY     Past Medical History:   Diagnosis Date   ??? Abnormal cardiovascular stress test 06/21/2020    06/21/2020 Preop exam showed abnormal stress test with no EKG e/o ischemia but e/o large anterior wall ischemia with recommendation for cardiac catheterization. Had second opinion with repeat exercise nuclear stress test showing no e/o ischemia and was recommended against cardiac catheterization. I recommended starting Atorvastatin 40 mg every day for her DM2, but patient wishes to repeat labs fir   ??? Diabetes mellitus (HCC/RAF) 02/20/2017    06/21/2020 Chronic, stable. Last HgA1c 6.6% on 06/03/19. C/w MFM 500 mg BID. Repeat HgA1c. Recommended starting Atorvastatin 40 mg every day but patient wishes to repeat labs first. Will need to clarify last retinopathy screening at next visit.    ??? Hypertension, essential 06/21/2020    06/21/2020 Chronic,s table. C/w Benicar 40/25 mg x1/2 tablet every day. Reassess at next in office appointment.    ??? Hypothyroidism 06/21/2020   ??? Myelopathy concurrent with and due to spinal stenosis of cervical region (HCC/RAF) 06/21/2020    06/21/2020 Radiculopathy and myelopathy symptoms since forehead injury 08/25/19. MRI cervical spine 09/30/19 showing multilevel degenerative changes and central disk/osteophyte protrusion impinging the spinal cord with severe spinal stenosis and severe BL foraminal stenosis. Originally recommended for surgical intervention, which was then cancelled due to abnormal preoperative stress test. Neurosurge   ??? OSA (obstructive sleep apnea) 06/21/2020    06/21/2020 Chronic, stable. C/w CPAP at bedtime.        MEDICATIONS     Outpatient Medications Prior to Visit   Medication Sig   ??? atorvastatin 20 mg tablet Take 1 tablet (20 mg total) by mouth daily. (Patient not taking: Reported on 07/07/2020.)   ??? ciclopirox 8% solution Apply topically at bedtime Apply to adjacent skin and affected nails daily. Remove with alcohol every 7 days. Use up to 48 weeks.Marland Kitchen   ??? gabapentin 100 mg capsule    ??? gabapentin 300 mg capsule take 1 capsule by mouth five times a day   ??? levothyroxine 100 mcg tablet take 1 tablet by mouth every morning before breakfast   ??? meloxicam 15 mg tablet Take 1 tablet (15 mg total) by mouth daily.   ??? methylPREDNISolone 4 mg tablet pack Take as directed on package on package. (Patient not taking: Reported on 07/07/2020.)   ??? methylPREDNISolone 4 mg tablet pack Take as directed on package.   ??? olmesartan-hydroCHLOROthiazide 40-25 mg tablet Take 0.5 tablets by mouth daily.     No facility-administered medications prior to visit.       ALLERGIES     Allergies   Allergen Reactions   ??? Iodinated Diagnostic Agents  Anaphylaxis       SURGICAL HISTORY     Past Surgical History:   Procedure Laterality Date   ??? CESAREAN SECTION      x2   ??? CHOLECYSTECTOMY  1987   ??? INCISIONAL HERNIA REPAIR  1987       SOCIAL HISTORY     Social History     Tobacco Use   Smoking Status Never Smoker   Smokeless Tobacco Never Used     Social History     Substance and Sexual Activity   Alcohol Use Yes     The patient is disabled due to neck injury and spinal cord compression    FAMILY HISTORY   No family history on file.    ROS     Review of Systems    []  Upon full 12 point review of systems seen below, all findings were negative    Constitutional   []  Fever [] Chills [] Sweats [] Weight Change   Head, Eyes, Ears, Nose and Throat   [] Wear Contact Lenses [] Dentures [] Wearing Eyeglasses   [] Double Vision [] Cataract [x] Dizziness   [] Difficulty Swallowing [x] Neck Pain [] Sore Throat   [] Nosebleeds [] Problems with eyesight [] Ringing in the Ears   Cardiovascular   [] Chest Pain/Discomfort [] Cardiovascular Symptom [] Heart Murmur   [x] Swelling lower extremity [] Leg pain with exercise [] Palpitations   Hematology/Lymphatic   [] Bleeding Problem [] Swollen Glands [] Lymphoma   [] Anemia [] Skin Lump - Location    Respiratory   [] Difficulty Breathing [] Wheezing [] Previous Pulmonary Disease   [] Exposure to TB [] Cough [] Pulmonary Symptoms   Gastrointestinal   [] Nausea [] Vomiting []  Diarrhea   [] Decrease in Appetite [] Abdominal Pain [] Constipation   Endocrine   [] Often Thirsty [] Frequent Urination [] Thyroid Disease   [] Urinary Symptoms [] Prostate Problems [] Prior Kidney Disease   Musculoskeletal   []  Musculoskeletal symptoms [] Feeling weak [x] Joint Pain, Arthralgia (hip pain x1 month)   [] Weakness of limbs [] Prior Fracture    Nervous System   [] Ataxia [] Speech Difficulties [] Headache   [] Neuropathy [] Confusion/Disorientation [] Fainting   [] Convulsions    Skin   [x] Rash [] Ulcer [] Lesions [] Sun Sensitivity   [x] Color change [] Slow Healing [] Infections [x] Cracking   [] Eczema (Pruritus) [] Growth [] Hair Loss   Allergic Immunologic History   [] Dermatitis [] Rheumatoid Arthritis [] Lupus [] Collagen Vascular   Psychiatric   [x] Nervousness [] Tension [] Depression      PHYSICAL EXAM     GEN:  No acute distress.  The patient is alert/oriented x3 and demonstrates appropriate mood and affect.  The patient looks stated age and appears well nourished.    VASC:  There are 2/4 palpable pedal pulses to both feet.    The feet are warm to touch and there is brisk capillary refill time to all the toes.    There are no varicosities or telangiectasias to the ankles and hindfeet.  There is no pitting edema.  Enlargement/fat deposits to lateral ankle bilateral    DERM:   Skin turgor and tone are within normal limits.   Thickened toenails with yellow discoloration and subungual debris with tenderness to palpation    NEURO:  The patient demonstrates intact light touch and protective threshold sensation  Diminished vibratory sensation  No motor weakness with resisted range of motion testing about the ankles and toes.  Intact pedal reflexes.   There is no tinel's sign with percussion to the posterior tibial nerve.    MSK:  she has severe pes plano valgus deformity.    There is complete collapse of the medial arch on weightbearing exam.    There is  no tenderness to palpation at the moment to the feet or ankles.    she does point to the arches of both feet as the location of pain with high impact activity.    No evidence of muscle weakness noted.    No other gross deformity.    Lab Results   Component Value Date    HGBA1C 6.7 (H) 06/29/2020       ASSESSMENT/PLAN   Rihana Kiddy is a 60 y.o. female who presents with:  # Chronic tendonitis Intrinsic muscles of the arches bilateral feet  # Pes plano valgus deformity with increased pronation  # Diabetes type 2 with peripheral neuropathy  # Onychomycosis    ??? Avoid walking barefoot or wearing flimsy non-supportive shoes.  ??? Recommend supportive shoe such as Hoka Gaviota  ??? Recommend Sole active medium orthotics as OTC option  We will submit a request for authorization of custom functional foot orthotics  Continue ciclopirox for toenail fungus, reviewed oral antifungal medication which patient defers at this time.  Measured patient's shoe as size 9  Diabetic foot exam performed, advised patient to check feet daily for any abnormalities due to elevated risk as a result of peripheral neuropathy  Patient may qualify due to her diabetes and peripheral neuropathy  Once approved the patient will return to the office to be fitted    Edgardo Roys, [facility]  07/23/2020

## 2020-07-25 DIAGNOSIS — E1142 Type 2 diabetes mellitus with diabetic polyneuropathy: Secondary | ICD-10-CM

## 2020-07-25 DIAGNOSIS — B351 Tinea unguium: Secondary | ICD-10-CM

## 2020-07-25 DIAGNOSIS — M7750 Other enthesopathy of unspecified foot: Secondary | ICD-10-CM

## 2020-07-25 DIAGNOSIS — M2141 Flat foot [pes planus] (acquired), right foot: Secondary | ICD-10-CM

## 2020-07-25 DIAGNOSIS — M2142 Flat foot [pes planus] (acquired), left foot: Secondary | ICD-10-CM

## 2020-07-26 ENCOUNTER — Ambulatory Visit: Payer: BLUE CROSS/BLUE SHIELD

## 2020-07-26 ENCOUNTER — Ambulatory Visit: Payer: MEDICAID

## 2020-07-26 ENCOUNTER — Inpatient Hospital Stay: Payer: PRIVATE HEALTH INSURANCE

## 2020-07-26 ENCOUNTER — Institutional Professional Consult (permissible substitution): Payer: PRIVATE HEALTH INSURANCE

## 2020-07-26 ENCOUNTER — Ambulatory Visit: Payer: PRIVATE HEALTH INSURANCE

## 2020-07-26 ENCOUNTER — Inpatient Hospital Stay: Payer: MEDICARE

## 2020-07-26 ENCOUNTER — Ambulatory Visit
Admission: RE | Admit: 2020-07-26 | Discharge: 2020-07-26 | Disposition: A | Discharge status: Home / Self Care | Payer: BC Managed Care – PPO | Source: Ambulatory Visit | Attending: Obstetrics and Gynecology | Admitting: Obstetrics and Gynecology

## 2020-07-26 ENCOUNTER — Ambulatory Visit: Payer: BC Managed Care – PPO

## 2020-07-26 ENCOUNTER — Other Ambulatory Visit: Payer: Self-pay

## 2020-07-26 DIAGNOSIS — Z719 Counseling, unspecified: Secondary | ICD-10-CM

## 2020-07-26 DIAGNOSIS — M5412 Radiculopathy, cervical region: Secondary | ICD-10-CM

## 2020-07-26 DIAGNOSIS — M4712 Other spondylosis with myelopathy, cervical region: Secondary | ICD-10-CM

## 2020-07-26 DIAGNOSIS — G549 Nerve root and plexus disorder, unspecified: Secondary | ICD-10-CM

## 2020-07-26 DIAGNOSIS — M25552 Pain in left hip: Secondary | ICD-10-CM

## 2020-07-26 DIAGNOSIS — M25551 Pain in right hip: Secondary | ICD-10-CM

## 2020-07-26 DIAGNOSIS — M4722 Other spondylosis with radiculopathy, cervical region: Secondary | ICD-10-CM

## 2020-07-26 DIAGNOSIS — Z1231 Encounter for screening mammogram for malignant neoplasm of breast: Secondary | ICD-10-CM | POA: Diagnosis not present

## 2020-07-26 NOTE — Progress Notes
[facility], [place]  Department of Neurosurgery      Progress Note     Primary Care Physician: Clint Lipps, DO  Referring Practitioner: Bobbe Medico., MD  Attending Physician: Lenna Gilford MD    Chief Complaint: No chief complaint on file.    Date of Service: 07/26/2020    Interim History:   Chelsea [name] is a 60 y.o.-year-old female, history of Abnormal cardiovascular stress test (06/21/2020), Diabetes mellitus (HCC/RAF) (02/20/2017), Hypertension, essential (06/21/2020), Hypothyroidism (06/21/2020), Myelopathy concurrent with and due to spinal stenosis of cervical region (HCC/RAF) (06/21/2020), OSA (obstructive sleep apnea) (06/21/2020), and 20 years disability due to severe panic disorder, who returns to the clinic with neck pain, imbalance, and tingling in the hands. The patient was last seen on 06/23/2020, at which time we recommended MR cervical imaging as well as XR flex + ex + oblique, and provided the patient with a referral to Dr. Vickki Hearing.    Today, the patient notes her symptoms have persisted. She continues to have pain in her RUE, with weakness in right deltoid and right bicep, as well as 4+ weakness in right grip. She reports new pain in left posterior neck and shoulder in a similar distribution to her right, ending above the scapula. Chelsea [name] denies any weakness in her LUE, but continues to have difficulty with bilateral fine motor control. She also endorses numbness in her feet bilaterally. She consulted with Dr. Vickki Hearing and is scheduled for [facility] with him on 07/28/2020.  She denies any other neurological symptoms.    06/23/2020:  The patient reports that 1 year ago she was getting into a van when she hit her head and suffered significant compression. After this incident, she began experiencing severe pain in her neck and tingling in her hands. She describes the pain as primarily in the right side of her neck, trapezius, and superior aspect of the scapula. She describes the pain as achy, dull, cramping, and excrutiating in nature. Her pain is there all the time, limits her ability to sleep, and is illicited with hyperextension, such as sleeping on her stomach. While her pain was originally on the right side, she is now experiencing left-sided pain. She does have pain that occasionally shoots into her arms, right side occasionally, not left. When in the arm, her pain stays in the upper part around the deltoid and biceps, and does not shoot into her hands. She does endorse tingling in all 5 fingers of both hands, and she has a decreased ability to use her hands for fine motor movements. Chelsea [name] also endorses problems with balance. When all her symptoms began, she attempted physical therapy which did not provide notably benefit, and the therapist felt uncomfortable performing injections. She does obtain good relief from gabapentin in regards to the tingling in her hands, but it does not help with her neck pain. Chelsea [name] denies bladder/bowel issues. She does have trouble walking due to feeling imbalance, and had no symptoms prior to her incident.   ???  Finally, Chelsea [name] is able to perform all activities of daily living without assistance. She denies any other neurological symptoms.       Physical Exam:   There were no vitals taken for this visit.    BMI Readings from Last 1 Encounters:   07/16/20 47.55 kg/m???        General: Well appearing, no acute distress    Upper Extremities:  Inspection: Cervical spine alignment is grossly normal on visual inspection.    Palpation:  There is no tenderness to palpation at the cervical spine. No palpable step-off appreciated.    ROM: Symmetric and appropriate.    Motor  C5-T1 Deltoid Bicep Tricep WE WF IO FF   RUE 4 4 4+ 5 5 5  4+   LUE 5 5 5 5 5 5 5    Sensation  Sensation in C5-T1 dermatomes is intact in bilateral upper extemities    Reflexes:  Biceps/Triceps normoactive  Negative Hoffman's bilaterally    Lower Extremities:  Inspection: Lumbar spine alignment is grossly normal on visual inspection.    Palpation: There is no tenderness to palpation at the thoracic/lumbar. No palpable step-off appreciated.    ROM: Symmetric and appropriate.    Motor  L2-S1 Hip Flex Quad TA EHL GS   RLE 5 5 5 5 5    LLE 5 5 5 5 5    Sensation  Sensation in L2-S1 dermatomes is intact in bilateral lower extremities    Reflexes:  Patella/ Achilles normoactive  Negative Babinski bilaterally    Gait: Normal with no ataxia or major gait abnormality.    Diagnostic Imaging:   MR Cervical:   IMPRESSION: There are multilevel degenerative changes throughout cervical spine as described, greatest at C5-C6, where there is is posterior disc protrusion resulting in severe central canal stenosis with indentation of ventral cord. There is mild patchy T2   hyperintense signal within the cord at this level which may represent edema or myelomalacia. There are also moderate bilateral foraminal stenosis at this level.      XR Cervical:   IMPRESSION: Degenerative disease most prominent at C5-C6.  ???    Assessment/Plan:   Chelsea [name] is a 60 y.o.-year-old female who returns to the clinic with neck pain, imbalance, and tingling in the hands. She notes her symptoms have persisted on her right side, and she has began to have similar symptoms in her LUE. Imaging demonstrates multilevel degeneration with cervical stenosis most severe at C5/6.    Plan:   We discussed the overall prognosis of cervical myeloradiculopathy and it's tendency to slowly deteriorate in step-wise fashion for many patients. We also discussed that it may remain stable without progression in a small number of patients. Additionally, we addressed the possibility that in a small percentage of patients myelopathic symptoms may progress quickly.     Furthermore, we discussed the importance and need for surgical intervention when there is neurological deterioration, such as gait disturbance, bowel/bladder dysfunction, and hand clumsiness.    We reviewed the potential risks of no continued observation including progression of myelopathy, neurologic deficit and worsening function. We reviewed the surgical goals, which are to halt the progression of neurologic deficits and that return of neurologic function may not occur and is not guaranteed even with surgery.    Given these circumstances, and understanding the risks and benefits of surgical intervention, the patient would prefer to proceed with {Blank single:19197::''continued observation.'',''surgical decompression.''}. Hence, we will {Blank single:19197::''proceed with monitoring every 3-6 months.'',''obtaining pre-operative clearance and surgical scheduling.''}    We will see the patient back in 3 months with repeat x-rays of the {Blank single:19197::''cervical'',''thoracic'',''lumbar''} spine, AP and lateral views. The patient expressed an understanding and agreement to the plan. We encouraged the patient to contact our clinic in the interim with any questions or concerns. All questions were answered.     40+ minutes were spent personally by me today on this encounter which may include today???s pre-visit review of the chart, time spent during the visit, and today???s  time spent after the visit documenting and coordinating care. All questions were answered and Chelsea [name] understood and was satisfied with this plan.     Clide Deutscher Kaimana Neuzil assisted in the evaluation of this patient, which was directly supervised by Dr. Lenna Gilford.    SCRIBE SIGNATURE(S):  I, Marcello Moores, have assisted Vivas, Doy Hutching., MD with the documentation for Chelsea [name] on   07/26/2020 at 9:16 AM.    PHYSICIAN SIGNATURE(S):  Bobbe Medico., MD  07/26/2020 9:16 AM    I have reviewed this note, written by Marcello Moores and attest that it is an accurate representation of my H & P and other events of the outpatient visit except if otherwise noted.

## 2020-07-26 NOTE — Patient Instructions
PLEASE REVIEW INFORMATION BELOW:     ??? For all visits, please be ready to show your photo ID and insurance card(s), and pay any applicable co-pays that will be due at the time of visit.     ??? It is your responsibility to be sure that your health insurance plan covers any lab test, radiology procedure, consultation, orthopaedic procedures which include, but not limit, injections, casting, bracing, durable medical equipment, etc. which has been recommended for you.  Contacting the Office:  ??? Each physician office has a Patient Care Coordinator that will help coordinate your care. They can help with:  ??? General questions  ??? Relay specific questions to the doctor   ??? Schedule surgery  ??? Follow up on authorizations that are pending  ??? Provide paperwork such as a return to work/school letter or disability forms  ??? Please allow 24 hours for messages to be returned from the physician???s office.  ??? There is a 7-10 business day turnaround for all forms.  ??? We encourage you to sign up for [facility] to communicate directly with the office or physician online. Please ask any staff member for additional information.   Following Up with the Physician:  ??? Before leaving, physician will let you know if you will need to follow-up within a certain time. If yes, make sure you schedule your follow-up appointment at the front desk or by calling the centralized scheduling center at:  ???  [phone] Orthopaedic Surgery.  ???  [phone] [facility].  ???  [phone] [facility].  ??? Did your physician order any labs, imaging, or tests that should be completed before your next follow-up? If yes, please continue reading below.  Physician Order/Pre-authorization:  ??? For any physician orders/pre-authorizations, please allow 5-7 business days for authorization to be obtained.  Imaging (MRI, X-Ray, CT Scan, etc)  ??? If you are scheduling at a [facility], please call Radiology at [phone] to schedule your imaging. You do not need to wait for an authorization to schedule.   ??? If you are scheduling outside of [place]:  ??? Notify your physician???s office of your location choice, this will help in obtaining authorization for the correct facility.  ??? Once you complete the imaging, obtain a copy of the report and images. Provide a copy to your physician for review.  ??? Schedule a follow-up appointment to review the images, unless stated otherwise by your physician or the office.  Injections  ??? If the physician recommends an injection, plan anywhere from 1-5 follow-up visits with your physician in a specific timeframe to complete the treatment plan. The follow-up visits can be as frequent as once a week until all the dosage has been administered.  ??? If authorization is required, allow 5-7 business days for the office to obtain authorization.  Lab work  ??? If labs are ordered, keep in mind:  ??? Fasting or non-fasting?  ??? How soon must they be done? (as soon as possible, one week before next appointment, a month before next appointment, etc.)  ??? Do you need to schedule a follow-up visit to discuss results or did the physician say they will call you with the results?   ??? If lab work is completed at a [facility], your results will automatically be sent to the physician, however allow a few days for results to be completed. Some labs require more time than others for results to finalize.  ??? If lab work is completed outside  a [facility], bring a copy of the lab orders with you. After a few days, follow-up with the lab to obtain a copy of your lab results for your physician.  Physical Therapy  ??? If physical therapy is recommended, notify the office of where you would prefer to seek therapy.  ??? Before doing so, verify with the physical therapy location that your insurance will be accepted.  ??? If authorization is required, allow 5-7 business days for the office to obtain authorization.  ??? If physical therapy is completed at a [facility] facility, the progress reports will automatically be sent to the physician.  ??? Physical therapy requires multiple visits to your chosen facility, anywhere from 2 weeks and more depending on the nature of your case.  Nerve Conduction/EMG Study  ??? If the physician ordered a Nerve Conduction or EMG Study, contact the Refer to Physician Office to schedule your appointment.
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
## 2020-07-27 ENCOUNTER — Telehealth: Payer: BLUE CROSS/BLUE SHIELD

## 2020-07-27 ENCOUNTER — Telehealth: Payer: PRIVATE HEALTH INSURANCE

## 2020-07-27 ENCOUNTER — Ambulatory Visit: Payer: BLUE CROSS/BLUE SHIELD

## 2020-07-27 DIAGNOSIS — L218 Other seborrheic dermatitis: Secondary | ICD-10-CM | POA: Diagnosis not present

## 2020-07-27 DIAGNOSIS — L918 Other hypertrophic disorders of the skin: Secondary | ICD-10-CM | POA: Diagnosis not present

## 2020-07-27 DIAGNOSIS — D225 Melanocytic nevi of trunk: Secondary | ICD-10-CM | POA: Diagnosis not present

## 2020-07-27 DIAGNOSIS — L821 Other seborrheic keratosis: Secondary | ICD-10-CM | POA: Diagnosis not present

## 2020-07-27 LAB — COVID-19 PCR

## 2020-07-27 NOTE — Telephone Encounter
I contacted this patient by phone to offer an appointment. Patient answered, however, they stated that they no longer needed an appointment. Patient thanked me.

## 2020-07-27 NOTE — Telephone Encounter
Attempted to call pt twice phone rings and disconnect. Will try again tomorrow to try to schedule [facility] consult

## 2020-07-27 NOTE — Telephone Encounter
Patient is requesting a referral for alternative care for her sleep apnea. Patient states sleep apnea is very bad and obstructs healthy sleeping. Patient has a cpap machine but it is cumbersome and ''not sexy.'' Therefore, patient would like to know if she can see a surgeon that can help her with removing something or clearing the way so that she can breathe better at night and sleep well.

## 2020-07-28 ENCOUNTER — Ambulatory Visit: Payer: BLUE CROSS/BLUE SHIELD

## 2020-07-28 DIAGNOSIS — M25551 Pain in right hip: Secondary | ICD-10-CM

## 2020-07-28 LAB — Glucose,POC: GLUCOSE,POC: 85 mg/dL (ref 65–99)

## 2020-07-28 MED ADMIN — GADOBUTROL 1 MMOL/ML IV SOLN: @ 23:00:00 | Stop: 2020-07-29 | NDC 50419032511

## 2020-07-28 MED ADMIN — SODIUM CHLORIDE 0.9 % IV SOLN: 10 mL | INTRAVENOUS | @ 23:00:00 | Stop: 2020-07-29

## 2020-07-28 MED ADMIN — BETAMETHASONE SOD PHOS & ACET 6 (3-3) MG/ML IJ SUSP: @ 23:00:00 | Stop: 2020-07-29 | NDC 00517072001

## 2020-07-28 MED ADMIN — LIDOCAINE HCL (PF) 1 % IJ SOLN: @ 23:00:00 | Stop: 2020-07-29 | NDC 55150015974

## 2020-07-28 MED ADMIN — SODIUM CHLORIDE (PF) 0.9 % INJ SOLN 10 ML VIAL (BULK CHARGE): Stop: 2020-07-29 | NDC 63323018610

## 2020-07-28 MED ADMIN — SODIUM CHLORIDE (PF) 0.9 % INJ SOLN 10 ML VIAL (BULK CHARGE): @ 23:00:00 | Stop: 2020-07-29 | NDC 63323018610

## 2020-07-28 MED ADMIN — BETAMETHASONE SOD PHOS & ACET 6 (3-3) MG/ML IJ SUSP: Stop: 2020-07-29 | NDC 00517072001

## 2020-07-28 NOTE — Telephone Encounter
Yesterday I contacted this patient by phone to discuss their referral.    Patient answered and I delivered the MD's message.     Patient verbalized understanding and thanked me.

## 2020-07-29 NOTE — Op Note
PREOPERATIVE DIAGNOSIS:  cervical radiculopathy    POSTOPERATIVE DIAGNOSIS:  cervical radiculopathy    Physician: Rhetta Mura, DO    PROCEDURE:  1) C7-T1 cervical interlaminar epidural steroid injection  2) fluoroscopic guidance.    INDICATIONS:  Neck pain/arm pain    TYPE OF ANESTHESIA:  Local 1% lidocaine    CONSENT FOR THE PROCEDURES:  A signed informed consent was obtained, and the risks, benefits, and alternatives were discussed with the patient.  They include but are not limited to bleeding, infection, pain at the site of injection, minimal effectiveness of the procedures, nerve damage, weakness, and numbness in the extremities, and worsening spine pain.  Risks of chronic steroid exposure explained clearly to patient, including, but not limited to: osteoporosis, adrenal insufficiency, increased blood pressure, increased blood sugars, avascular necrosis, and weight gain.  Patient expresses understanding of these risks and consents to procedure. The patient agreed to proceed with the procedures.    DESCRIPTION OF THE PROCEDURES IN DETAIL:  The patient was brought into the procedure room and placed in the prone position.  The skin was prepped and draped in a normal sterile fashion using chlorhexidine.  The skin between the seventh cervical and first thoracic interspace was identified using AP fluoroscopic guidance.  The skin overlying the region was anesthetized with a total of 3 mL of 1% lidocaine using a 25-gauge, half-inch needle.    Once the skin was anesthetized, a 20-gauge 3.5 inch Tuohy needle was then advanced using intermittent fluoroscopic guidance in the AP and contralateral oblique views and the loss of resistance technique in a midline approach.  Once a loss of resistance was achieved, and after negative aspiration for heme and cerebrospinal fluid, 1 mL of gadavist contrast was then injected under live fluoroscopy, which showed good epidural spread without intrathecal or intravascular uptake. Then a solution containing 2cc of betamethasone 5mg /mL and 1cc of preservative free normal saline was injected.  The needle was withdrawn using 0.5 mL of sterile 1% lidocaine flush as to avoid a steroid track.  The site then was dressed and a bandage was applied.  The patient tolerated the procedure well without complications and was discharged in stable condition.  Post-procedure instructions were given.    ESTIMATED BLOOD LOSS:  Minimal.    COMPLICATIONS:  None    Rhetta Mura

## 2020-07-30 ENCOUNTER — Ambulatory Visit: Payer: MEDICAID

## 2020-07-30 ENCOUNTER — Ambulatory Visit: Payer: BLUE CROSS/BLUE SHIELD

## 2020-07-30 ENCOUNTER — Telehealth: Payer: MEDICAID

## 2020-07-30 DIAGNOSIS — L309 Dermatitis, unspecified: Secondary | ICD-10-CM

## 2020-07-30 DIAGNOSIS — L820 Inflamed seborrheic keratosis: Secondary | ICD-10-CM

## 2020-07-30 MED ORDER — CLOBETASOL PROPIONATE 0.05 % EX OINT
Freq: Two times a day (BID) | TOPICAL | 1 refills | Status: AC
Start: 2020-07-30 — End: ?

## 2020-07-30 NOTE — Progress Notes
New Patient Exam  ?  Chief complaint: rash  ?  History of present illness:  Chelsea [name], a 60 y.o. female referred for:    1. rash Location: hands  Duration: 6 weeks Associated symptoms: itch Prior treatment: none  2. Itchy growth in left ear, no treatment. Few weeks     Patient denies any other new or changing lesions of concern.    ?PMH/Meds/All/Soc/Fam hx: Reviewed in [facility]    Personal Hx skin cancer:  Negative for melanoma and negative for non-melanoma skin cancer  Fam Hx skin cancer:  Negative for melanoma and negative for non-melanoma skin cancer     No new changes to PMH/Meds/All/Soc/Fam hx - please refer to today's intake form.  Patient Active Problem List   Diagnosis   ??? Diabetes mellitus (HCC/RAF)   ??? Abnormal cardiovascular stress test   ??? OSA (obstructive sleep apnea)   ??? Myelopathy concurrent with and due to spinal stenosis of cervical region (HCC/RAF)   ??? Hypertension, essential   ??? Hypothyroidism   ??? Morbid (severe) obesity due to excess calories (HCC/RAF)   ??? Candidate for statin therapy due to risk of future cardiovascular event     Outpatient Medications Prior to Visit   Medication Sig   ??? atorvastatin 20 mg tablet Take 1 tablet (20 mg total) by mouth daily.   ??? ciclopirox 8% solution Apply topically at bedtime Apply to adjacent skin and affected nails daily. Remove with alcohol every 7 days. Use up to 48 weeks.Marland Kitchen   ??? gabapentin 100 mg capsule    ??? gabapentin 300 mg capsule take 1 capsule by mouth five times a day   ??? levothyroxine 100 mcg tablet take 1 tablet by mouth every morning before breakfast   ??? meloxicam 15 mg tablet Take 1 tablet (15 mg total) by mouth daily.   ??? methylPREDNISolone 4 mg tablet pack Take as directed on package on package. (Patient not taking: Reported on 07/07/2020.)   ??? methylPREDNISolone 4 mg tablet pack Take as directed on package. (Patient not taking: Reported on 07/26/2020.)   ??? olmesartan-hydroCHLOROthiazide 40-25 mg tablet Take 0.5 tablets by mouth daily.     No facility-administered medications prior to visit.     Allergies   Allergen Reactions   ??? Iodinated Diagnostic Agents Anaphylaxis     ?  Review of systems:   ?Constitutional: Negative  ENMT: Not assessed   Endo: Not assessed   Musculoskelatal: Not assessed   Skin otherwise Negative  Conjunctiva and Lids: Negative    Objective:   General appearance: well-developed/well nourished for stated age, no apparent distress.  Mental status: alert and oriented.   Mood: cooperative, pleasant.  Affect: normal  A examination of skin was performed on hands and ears  -Erythematous, scaly ill-defined patches distributed over the bilateral dorsal hands, with superficial erosions  -Well demarcated tan to brown waxy stuck-on hyperkeratotic papule on the left concha bowl    Assessment/Plan:  1. Hand Dermatitis, acute flare  - reviewed benign but chronic nature of disease  - gentle skin care recommendations:  - decrease hand washing frequency  - apply emollient post-hand washing (e.g. Vaseline/petroleum jelly)  - topical steroid side effects and use for active lesions only was reviewed   - start clobetesol 0.05%AA BID   - wear plastic non-latex gloves for wet work (e.g. washing dishes, gardening); use cotton glove as an inner liner if needed to wick away sweat    2 ISK      -  LN2x1       - verbal consent obtained and wound care instructions given; risks of scar, pigment          change, redness and recurrence reviewed; risks and alternatives to therapy          discussed      Sun protection strategies including sunscreen use with SPF 30+ and skin cancer risk factors reviewed.   ?  Follow-up in 1 month    Lorin Picket, M.D.  Dermatology

## 2020-07-30 NOTE — Telephone Encounter
Call Back Request      Reason for call back: Pt is requesting to speak to Aaron Edelman in MD's office. She states that she wants to know if that is Dr Starr Lake nurse and if Aaron Edelman is his correct name. Please advise     Any Symptoms:  []  Yes  [x]  No       If yes, what symptoms are you experiencing:    o Duration of symptoms (how long):    o Have you taken medication for symptoms (OTC or Rx):      Patient or caller has been notified of the 24-48 hour turnaround time.

## 2020-07-30 NOTE — Progress Notes
Patient Consent to Telehealth   The patient agreed to participate in the telephone visit prior to joining the visit.          CARDIOLOGY OUTPATIENT CONSULTATION NOTE    PRIMARY CARE PROVIDER: Clint Lipps, DO    REFERRED BY: No referring provider defined for this encounter.    Ernesha Scripture  [id number]  60 y.o.  1961/04/02      REASON FOR CONSULTATION/REFERRAL: Abnormal Cardiac Stress Test and Cardiovascular Assessment     HISTORY OF PRESENT ILLNESS: Chelsea [name] is a 59 y.o. patient with history of DM, HTN, Hypothyroidism, Cervical spinal myelopathy, OSA, who presents to cardiology for Abnormal Cardiac Stress Test and Cardiovascular Assessment       Last seen by Dr. Ricki Miller in 1/22, who was thoughtful towards the patient's care to place in a referral and per notes  ''Had abnormal stress as part of preop exam with cardiology showing no ischemic EKG changes but e/o large anterior wall ischemia. Recommended for angiogram and starting ASA 81 mg every day. She was seen by a second cardiologist who repeated an exercise nuclear stress test with no evidence of ischemia and recommended stopping aspirin and no need for angiogram. She would like a second opinion at [facility]. Denies CP, SOB, orthopnea. Has mild BL LE edema.''    Today, pt states that she came out to [place] in 1983, but originally from [place], but here with kids 3, 7 grandchildren, 1 ggc all between [place] and [place]. 3 younger kids and great grandbaby 4 here in [place]. [place] in 09/2020 plan vacation and meet there with everyone. Son and DIL want ot go [place]. States the issues with back and forth stress tests results have been frustating and provided eduation regarding all tests and possible interpretiaons.     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 Pt otherwise doing well and went over results of NM stress test. 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 Still with spinal pain. Improved pain since injection/procedure this past Thursday. Denies CP.DOE. Pt prior nurse as well. Questions about BMI and BP as well.         PAST MEDICAL HISTORY:  Past Medical History:   Diagnosis Date   ??? Abnormal cardiovascular stress test 06/21/2020    06/21/2020 Preop exam showed abnormal stress test with no EKG e/o ischemia but e/o large anterior wall ischemia with recommendation for cardiac catheterization. Had second opinion with repeat exercise nuclear stress test showing no e/o ischemia and was recommended against cardiac catheterization. I recommended starting Atorvastatin 40 mg every day for her DM2, but patient wishes to repeat labs fir   ??? Diabetes mellitus (HCC/RAF) 02/20/2017    06/21/2020 Chronic, stable. Last HgA1c 6.6% on 06/03/19. C/w MFM 500 mg BID. Repeat HgA1c. Recommended starting Atorvastatin 40 mg every day but patient wishes to repeat labs first. Will need to clarify last retinopathy screening at next visit.    ??? Hypertension, essential 06/21/2020    06/21/2020 Chronic,s table. C/w Benicar 40/25 mg x1/2 tablet every day. Reassess at next in office appointment.    ??? Hypothyroidism 06/21/2020   ??? Myelopathy concurrent with and due to spinal stenosis of cervical region (HCC/RAF) 06/21/2020    06/21/2020 Radiculopathy and myelopathy symptoms since forehead injury 08/25/19. MRI cervical spine 09/30/19 showing multilevel degenerative changes and central disk/osteophyte protrusion impinging the spinal cord with severe spinal stenosis and severe BL foraminal stenosis. Originally recommended for surgical intervention, which was then cancelled  due to abnormal preoperative stress test. Neurosurge   ??? OSA (obstructive sleep apnea) 06/21/2020    06/21/2020 Chronic, stable. C/w CPAP at bedtime.        PAST SURGICAL HISTORY:  Past Surgical History:   Procedure Laterality Date   ??? CESAREAN SECTION      x2   ??? CHOLECYSTECTOMY  1987   ??? INCISIONAL HERNIA REPAIR  1987       ALLERGIES:  Allergies   Allergen Reactions   ??? Iodinated Diagnostic Agents Anaphylaxis CURRENT MEDICATIONS:  No current facility-administered medications for this visit.       SOCIAL HISTORY:  Social History     Socioeconomic History   ??? Marital status: Single     Spouse name: Not on file   ??? Number of children: Not on file   ??? Years of education: Not on file   ??? Highest education level: Not on file   Occupational History   ??? Not on file   Tobacco Use   ??? Smoking status: Never Smoker   ??? Smokeless tobacco: Never Used   Substance and Sexual Activity   ??? Alcohol use: Yes   ??? Drug use: Not on file   ??? Sexual activity: Not on file   Other Topics Concern   ??? Not on file   Social History Narrative   ??? Not on file     Social Determinants of Health     Physical Activity: Not on file   Stress: Not on file   Financial Resource Strain: Not on file         FAMILY HISTORY:  No family history on file.    REVIEW OF SYSTEMS  Comprehensive 14-point review of systems was conducted and negative except for those points indicated in the HPI.     PHYSICAL EXAMINATION:  TELEPHONE LIMITED PHYSICAL EXAMINATION:  General: Per voice sounds AAOx4, NAD and coversant  HEENT: unable to be assessed via telephone  Neck: JVP unable to be assessed via telephone  Heart: rate/rhythm/murmurs unable to be assessed via telephone  Lungs: breath sounds unable to be assessed via telephone  Abd: bowel sounds unable to be assessed via telephone  Extremities: unable to be assessed via telephone  Skin: unable to be assessed via telephone  Neuro: unable to be assessed via telephone  Psych: Pleasant, conversant, appropriate insight and orientation during phone interview    DIAGNOSTIC STUDIES:  Lab Results   Component Value Date    NA 139 06/29/2020    K 4.0 06/29/2020    CL 102 06/29/2020    CO2 27 06/29/2020       Lab Results   Component Value Date    WBC 6.09 06/29/2020    HGB 13.1 06/29/2020    HCT 43.7 06/29/2020    MCV 93.8 06/29/2020    PLT 263 06/29/2020       Last 3 Lipids (Up to last 3 results from the past 45409 hours)      01/11 0819    Cholesterol       185  Comment: The significance of total cholesterol depends on the values of LDL, HDL, triglycerides and the clinical context. A patient-provider discussion may be considered.         Cholesterol, HDL       44  Comment: If HDL cholesterol level falls outside of the designatedrange, a patient-provider discussion is recommended         Triglycerides       107  Comment: If Triglyceride level falls outside of the designated range,a patient-provider discussion is recommended.         Non-HDL,Chol,Calc       141  Comment: If Non-HDL cholesterol level falls outside of the designatedrange, a patient-provider discussion is recommended.               Lab Results   Component Value Date    ALT 20 06/29/2020    AST 16 06/29/2020    ALKPHOS 85 06/29/2020    BILITOT 0.3 06/29/2020       Lab Results   Component Value Date    HGBA1C 6.7 (H) 06/29/2020       Future Appointments   Date Time Provider Department Center   07/30/2020  9:30 AM Maisie Fus., MD TORR DERM [place]   08/02/2020  8:40 AM Miquel Dunn., MD CARD [id number] MEDICINE   08/06/2020 11:45 AM PORTER [facility] [place] 01 - RADIOLOGY [place] PRT [facility] DT NH SC RAD   08/12/2020  1:45 PM Agustina Caroli., DO ORT PHMED SP ORTHOPEDICS   08/16/2020 11:00 AM Romeroso, Jillyn Hidden., MD [id number] OBGYN [facility]   08/18/2020 10:00 AM Long, Lise Auer., MD, PhD HN MP2 SURGERY   08/25/2020  9:00 AM Dellia Cloud., MD NUTRI MSS MEDICINE   09/16/2020  2:30 PM Mcarthur Rossetti., MD JSEC-SM OPH   09/30/2020 10:15 AM Clint Lipps, DO INTMED [id number] MEDICINE     10/1997 TTE  1. Technically difficult echocardiogram; left ventricular endocardium  not well seen. Normal left ventricular size, wall thickness, wall  motion and systolic function; estimated left ventricular ejection  fraction is 60-65 %.  2. Normal right ventricular size and systolic function.  3. Normal atrial sizes. Normal inferior vena cava size with normal  respiratory changes is consistent with normal right atrial  pressure.  4. Aortic valve morphology not well seen. Peak aortic valve velocity  is approximately 1.7 m/sec. No aortic regurgitation.  5. Mild focal thickened mitral valve. Mitral valve E-velocity is 99  cm/sec; peak mitral valve A-velocity is 78 cm/sec; mitral valve  isovolumic relaxation time is 75 msec. Mitral valve deceleration  time of 202 msec is within normal limits. No mitral regurgitation.  6. Minimal tricuspid regurgitation. Tricuspid regurgitant velocity is  2.3 m/sec. Estimated right ventricular systolic pressure is 31 mm  Hg (if right atrial pressure is 10 mm Hg) which is consistent with  normal pulmonary artery pressure.  7. No pulmonic valve regurgitation. Time to peak velocity of right  ventricular outflow tract is approximately 167 msec which is  consistent with normal pulmonary artery pressure.  CONCLUSIONS  1. Technically difficult echocardiogram; grossly normal left  ventricular systolic function; left ventricular endocardium not  well seen.  2. Mildly thickened mitral valve.  3. Minimal tricuspid regurgitation.    OSH 10/21 NM stress  Normal exercise stress myocardial perfusion imaging. ???   The patient had no symptoms or EKG changes during exercise achieving 102%   of MPHR.   Gated SPECT imaging revealed normal myocardial perfusion and function with   no evidence of inducible ischemia    OSH 8/21 TTE  ??? ???Mild concentric left ventricular hypertrophy with normal systolic   function   ??? ???Normal diastolic function   ??? ???Mild left atrial enlargement   ??? ???Trace tricuspid regurgitation and normal pulmonary pressures    1/22 ECG  - sinus brady HR 58, QTC 410, no significant arrhythmias or prior MI or active ischemic changes noted  2/22 NM stress  CONCLUSIONS   1. Normal exercise stress ECG study at an adequate cardiac workload.   2. Good exercise tolerance.   3. Baseline blood pressure category is elevated (stage I hypertension). Blood pressure response to stress was exaggerated.   4. Patient's symptoms were not suggestive of ischemia.   5. ECG findings are not suggestive of ischemia.   6. No exercise induced arrhythmias were noted.  ???  1) Normal exercise stress myocardial perfusion imaging SPECT study at an adequate cardiac workload. There is no scintigraphic evidence of ischemia or prior myocardial infarction.  ???  2) Normal left ventricular size and systolic function. LVEF  65%.  ???  3) No previous cardiac nuclear stress test was available for comparison.    ASSESSMENT/PLAN:   Autym Siess is a 60 y.o. patient with history of DM, HTN, Hypothyroidism, Cervical spinal myelopathy, OSA, who presents to cardiology for Abnormal Cardiac Stress Test and Cardiovascular Assessment     Abnormal Cardiac Stress Test and Cardiovascular Assessment   - ''Had abnormal stress as part of preop exam with cardiology showing no ischemic EKG changes but e/o large anterior wall ischemia. Recommended for angiogram and starting ASA 81 mg every day. She was seen by a second cardiologist who repeated an exercise nuclear stress test with no evidence of ischemia and recommended stopping aspirin and no need for angiogram''  - Prior 9/21 NM stress report noted large anterior infarct area but then NM stress done again (results above in 10/21) which were unremarkable. Only able to view reports, but given habitus, possible that anterior breast attenuation, if not corrected for correctly on an NM study, can result in an anterior infarct pattern, especially if images were not corrected for gender/habitus. However, no images available for review, but requested pt to provided images on CD if possible.   - However, given concerns and distraught of pt regarding these discrepant results, discussed options regarding to have a final 3rd confirmatory test and discussed various stress testing options and risks and benefits of each (TTE stress echo, CCTA, NM stress, LHC, etc) and repeated NM stress per pt request to clarify findings, and performed 2/22 as above with normal results and negative for any ischemia or prior MI.     #Hypothyroidism  - S/p RAI .   - On Levothyroxine 100 mcg every day  - TSH WNL 1/22.   ???  #DM2  - Last HgA1c 6.7% on 06/29/20  - COnt metformin.    ???  #HTN  - SBP in clinic 120s previously   - Cont benicar  ???  #Obesity  - Chronic, stable  - Recommend cardiac healthy diet, exercise, and weight recommendations: eat an overall healthy dietary pattern similar to DASH (Dietary Approaches to Stop Hypertension) eating plan and aim for at least 150 minutes of moderate physical activity (aerobic) or 75 minutes of vigorous physical activity (or an equal combination of both) each week for weight loss as well as to improve physical and cardiovascular fitness, if possible. Also recommend education about intermittent fasting.     ???  #OSA  - Cont CPAP qHS      Return to clinic PRN and with PCP in the interim (or sooner PRN)    Thank you for your referral Dr. Ricki Miller!      The above plan of care, diagnosis, orders, and follow-up were discussed with the patient. I personally reviewed labs/imaging/ECG/telemetry/hemodynamics/cardiodiagnostic studies in depth. 21 minutes were spent in combination counseling patient and/or coordinating care,  and in prior note and chart review of both cardiac and non-cardiac notes and studies, and analysis for medical decision making. Questions related to this recommended plan of care were answered.     Miquel Dunn, MD  [facility]

## 2020-08-02 ENCOUNTER — Telehealth: Payer: PRIVATE HEALTH INSURANCE | Attending: Cardiovascular Disease

## 2020-08-02 DIAGNOSIS — E890 Postprocedural hypothyroidism: Secondary | ICD-10-CM

## 2020-08-02 DIAGNOSIS — E119 Type 2 diabetes mellitus without complications: Secondary | ICD-10-CM

## 2020-08-02 DIAGNOSIS — Z136 Encounter for screening for cardiovascular disorders: Secondary | ICD-10-CM

## 2020-08-04 ENCOUNTER — Telehealth: Payer: PRIVATE HEALTH INSURANCE

## 2020-08-05 ENCOUNTER — Ambulatory Visit: Payer: PRIVATE HEALTH INSURANCE

## 2020-08-06 NOTE — Telephone Encounter
Great to hear.   Thank you,  Wille Glaser

## 2020-08-10 ENCOUNTER — Ambulatory Visit: Payer: PRIVATE HEALTH INSURANCE

## 2020-08-10 NOTE — Patient Instructions
[facility] Discharge Instructions    Thank you for visiting the Preoperative Evaluation and [facility] today. An anesthesiologist is a doctor who provides care to patients like you during surgery. During your visit today, your medical condition and medications have been carefully reviewed by one of our anesthesiologists. Your anesthesiologist asks that you follow the following instructions pertaining to your upcoming procedure:    Medications:    Please TAKE ALL  YOUR MEDICATIONS AS PRESCRIBED    You may use enough water to comfortably swallow any pills on the morning of surgery.    Hydration:  It is also important to remain well hydrated before surgery. Please drink clear liquids up until 2 hours prior to your arrival time at [facility]. Clear liquids include any liquid you can see completely through: water, pedialyte, gatorade, black coffee, tea no milk.      Exercise:  It is important that you remain as active as possible prior to your procedure.     Other Instructions:  Please follow the ''Important Instructions Regarding Your Surgery'' that was given to you in your surgeon's office.

## 2020-08-12 ENCOUNTER — Ambulatory Visit: Payer: MEDICAID

## 2020-08-12 DIAGNOSIS — M4722 Other spondylosis with radiculopathy, cervical region: Secondary | ICD-10-CM

## 2020-08-12 DIAGNOSIS — M4712 Other spondylosis with myelopathy, cervical region: Secondary | ICD-10-CM

## 2020-08-12 NOTE — Progress Notes
[facility] Physiatry Follow up Note    Attending Physician: Rhetta Mura, DO    Chief Complaint:    Chief Complaint   Patient presents with   ??? Lower Back Pain       Today's Date: 08/12/2020    Last Visit:  06/29/2020    Since that time: Patient returns today for follow up after ESI.  3 days complete relief then some pain came back and then pain improved again significantly.  No longer taking gabapentin for the last 3-4 days.  The tinlging in her hands and feet are much improved.   She is doing excellent.  She has follow up planned with neurosurgery.      Cervical IL-ESI  07/28/2020    HISTORICAL DATA     Chelsea [name] is a 60 y.o. female, who is seen for consultation and evaluation at the request of Dr. Konrad Felix for pain in the neck with radiation into the bilateral upper extremity and consideration of injection.  Patient reports she hit forehead about 9 months ago.  She then got severe neck and bilateral radiating arm pain, weakness and numbness.  Numbness is constant in the hands.  She has difficulty with arm coordination.  She reports balance issues chronically and has need a cane.      She has tried naprosyn-- doesn't tolrate  Gabapentin 300mg  1300mg  daily- mild help      Previous work-up has included: MRI  Has tried the following treatments: medications  Associated symptoms - + bilateral upper extremity tingling, numbness, or weakness.  Denies bowel or bladder incontinence.  + gait instability.    Current Medications:  Current Outpatient Medications   Medication Sig   ??? ciclopirox 8% solution Apply topically at bedtime Apply to adjacent skin and affected nails daily. Remove with alcohol every 7 days. Use up to 48 weeks.Marland Kitchen   ??? clobetasol 0.05% ointment Apply topically two (2) times daily APPLY AND GENTLY MASSAGE INTO AFFECTED AREA(S) TWICE DAILY.   ??? gabapentin 100 mg capsule    ??? gabapentin 300 mg capsule take 1 capsule by mouth five times a day   ??? levothyroxine 100 mcg tablet take 1 tablet by mouth every morning before breakfast   ??? olmesartan-hydroCHLOROthiazide 40-25 mg tablet Take 0.5 tablets by mouth daily.   ??? atorvastatin 20 mg tablet Take 1 tablet (20 mg total) by mouth daily. (Patient not taking: Reported on 08/02/2020.)   ??? meloxicam 15 mg tablet Take 1 tablet (15 mg total) by mouth daily. (Patient not taking: Reported on 08/02/2020.)   ??? methylPREDNISolone 4 mg tablet pack Take as directed on package on package. (Patient not taking: Reported on 07/07/2020.)   ??? methylPREDNISolone 4 mg tablet pack Take as directed on package. (Patient not taking: Reported on 07/26/2020.)     No current facility-administered medications for this visit.        Allergies:  Iodinated diagnostic agents    Past Medical History:   Past Medical History:   Diagnosis Date   ??? Abnormal cardiovascular stress test 06/21/2020    06/21/2020 Preop exam showed abnormal stress test with no EKG e/o ischemia but e/o large anterior wall ischemia with recommendation for cardiac catheterization. Had second opinion with repeat exercise nuclear stress test showing no e/o ischemia and was recommended against cardiac catheterization. I recommended starting Atorvastatin 40 mg every day for her DM2, but patient wishes to repeat labs fir   ??? Diabetes mellitus (HCC/RAF) 02/20/2017    06/21/2020 Chronic, stable. Last  HgA1c 6.6% on 06/03/19. C/w MFM 500 mg BID. Repeat HgA1c. Recommended starting Atorvastatin 40 mg every day but patient wishes to repeat labs first. Will need to clarify last retinopathy screening at next visit.    ??? Hypertension, essential 06/21/2020    06/21/2020 Chronic,s table. C/w Benicar 40/25 mg x1/2 tablet every day. Reassess at next in office appointment.    ??? Hypothyroidism 06/21/2020   ??? Myelopathy concurrent with and due to spinal stenosis of cervical region (HCC/RAF) 06/21/2020    06/21/2020 Radiculopathy and myelopathy symptoms since forehead injury 08/25/19. MRI cervical spine 09/30/19 showing multilevel degenerative changes and central disk/osteophyte protrusion impinging the spinal cord with severe spinal stenosis and severe BL foraminal stenosis. Originally recommended for surgical intervention, which was then cancelled due to abnormal preoperative stress test. Neurosurge   ??? OSA (obstructive sleep apnea) 06/21/2020    06/21/2020 Chronic, stable. C/w CPAP at bedtime.        Past Surgical History:   Past Surgical History:   Procedure Laterality Date   ??? CESAREAN SECTION      x2   ??? CHOLECYSTECTOMY  1987   ??? INCISIONAL HERNIA REPAIR  1987       SocHx:   Social History     Socioeconomic History   ??? Marital status: Single     Spouse name: Not on file   ??? Number of children: Not on file   ??? Years of education: Not on file   ??? Highest education level: Not on file   Occupational History   ??? Not on file   Tobacco Use   ??? Smoking status: Never Smoker   ??? Smokeless tobacco: Never Used   Substance and Sexual Activity   ??? Alcohol use: Yes   ??? Drug use: Not on file   ??? Sexual activity: Not on file   Other Topics Concern   ??? Not on file   Social History Narrative   ??? Not on file     Social Determinants of Health     Physical Activity: Not on file   Stress: Not on file   Financial Resource Strain: Not on file       HABITS:       Social History     Tobacco Use   Smoking Status Never Smoker   Smokeless Tobacco Never Used        Social History     Substance and Sexual Activity   Alcohol Use Yes        Social History     Substance and Sexual Activity   Drug Use Not on file       FamHx:    No family history on file.     Allergies:  Iodinated diagnostic agents        Relevant Labs:  Lab Results   Component Value Date    HGBA1C 6.7 (H) 06/29/2020     Creatinine   Date Value Ref Range Status   06/29/2020 0.64 0.60 - 1.30 mg/dL Final     Lab Results   Component Value Date    WBC 6.09 06/29/2020    HGB 13.1 06/29/2020    HCT 43.7 06/29/2020    MCV 93.8 06/29/2020    PLT 263 06/29/2020       Physical Exam:     Vital Signs: BP 109/65  ~ Pulse 70   General:  The patient is well developed, well nourished, and in no acute distress, alert with appropriate mood and affect.  She is wearing soft collar neck brace  Eyes: EOMI  Respiratory: Normal work of breathing without apnea and no evidence of respiratory distress without use of accessory muscles.      Imaging and work-up:  Imaging was reviewed personally by me and demonstrate by reporting    Cervical MRI 09/30/2019  ''IMPRESSION -     No significant change.     1. ???Multilevel degenerative changes most pronounced at C5-C6 where there is trace   retrolisthesis with a central disk/osteophyte protrusion impinging the spinal cord with   severe spinal canal stenosis. There is mild facet arthropathy and bilateral   uncovertebral hypertrophy causing severe bilateral foraminal stenosis. This affects the exiting   bilateral C6 nerve roots. This level is unchanged.     2. Reversal of the cervical lordosis suggesting muscle spasm and/or degenerative   changes.    ''  Xray cervical spine  ''IMPRESSION:   Impression:     Degenerative spondylosis most pronounced at C5-6.     No evidence for dynamic instability   ''  Commentary and Medical Decision Making:    Chelsea [name] is a 60 y.o. female who presents to clinic today for evaluation for cervical radicular pain.     Assessment:  1. Cervical myeloradiculoapthy    Plan:    Educated patient regarding complexity of neck pain and multidisciplinary approach to managing symptoms.    Diagnostic Imaging:   - Reviewed image report    Medications:   No new    Physical therapy:  No cervical manipulation     Procedures:   - she did very well with cervical ESI which significantly helped her pain    Discussed that I recommend surgical follow up due to her myelopathy    Follow-up:  Primary care provider for further management or medication refills and therapy renewals.     Spine Center:with spine surgery and  if symptoms worsen or fail to improve    Advised the patient that if neurological issues were to develop such as weakness, bowel or bladder control, or worsening pain, that they should present immediately to the closest Emergency Room.    Thank you for this consultation; If I can be of further service, please contact my office.    Medical Decision Making: Low Complexity  1) Number and Complexity of Problems Addressed at Encounter:  - 1 chronic condition    2) Data Reviewed and Analyzed today:  - Tests/Documents (3)  - Reviewed prior external notes from referring provider, PCP, or surgeon  - Reviewed prior Xrays  - Reviewed Prior MRI  - Reviewed CBC  -Reviewed CMP  - Reviewed HgA1C        Rhetta Mura, DO  Interventional Spine  [facility]  Dept. Of Orthopedic Surgery

## 2020-08-12 NOTE — Patient Instructions
PLEASE REVIEW INFORMATION BELOW:     ??? For all visits, please be ready to show your photo ID and insurance card(s), and pay any applicable co-pays that will be due at the time of visit.     ??? It is your responsibility to be sure that your health insurance plan covers any lab test, radiology procedure, consultation, orthopaedic procedures which include, but not limit, injections, casting, bracing, durable medical equipment, etc. which has been recommended for you.  Contacting the Office:  ??? Each physician office has a Patient Care Coordinator that will help coordinate your care. They can help with:  ??? General questions  ??? Relay specific questions to the doctor   ??? Schedule surgery  ??? Follow up on authorizations that are pending  ??? Provide paperwork such as a return to work/school letter or disability forms  ??? Please allow 24 hours for messages to be returned from the physician???s office.  ??? There is a 7-10 business day turnaround for all forms.  ??? We encourage you to sign up for [facility] to communicate directly with the office or physician online. Please ask any staff member for additional information.   ??? Sofia: [phone],  [phone]    Following Up with the Physician:  ??? Before leaving, physician will let you know if you will need to follow-up within a certain time. If yes, make sure you schedule your follow-up appointment at the front desk or by calling the centralized scheduling center at:  ???  [phone] Orthopaedic Surgery.  ???  [phone] [facility].  ???  [phone] [facility].  ??? Did your physician order any labs, imaging, or tests that should be completed before your next follow-up? If yes, please continue reading below.  Physician Order/Pre-authorization:  ??? For any physician orders/pre-authorizations, please allow 5-7 business days for authorization to be obtained.  Imaging (MRI, X-Ray, CT Scan, etc)  ??? If you are scheduling at a [facility], please call Radiology at [phone] to schedule your imaging. You do not need to wait for an authorization to schedule.   ??? If you are scheduling outside of [facility]:  ??? Notify your physician???s office of your location choice, this will help in obtaining authorization for the correct facility.  ??? Once you complete the imaging, obtain a copy of the report and images. Provide a copy to your physician for review.  ??? Schedule a follow-up appointment to review the images, unless stated otherwise by your physician or the office.  Injections  ??? If the physician recommends an injection, plan anywhere from 1-5 follow-up visits with your physician in a specific timeframe to complete the treatment plan. The follow-up visits can be as frequent as once a week until all the dosage has been administered.  ??? If authorization is required, allow 5-7 business days for the office to obtain authorization.  Lab work  ??? If labs are ordered, keep in mind:  ??? Fasting or non-fasting?  ??? How soon must they be done? (as soon as possible, one week before next appointment, a month before next appointment, etc.)  ??? Do you need to schedule a follow-up visit to discuss results or did the physician say they will call you with the results?   ??? If lab work is completed at a [facility], your results will automatically be sent to the physician, however allow a few days for results to be completed. Some labs require more time than others for results to finalize.  ???  If lab work is completed outside a [facility], bring a copy of the lab orders with you. After a few days, follow-up with the lab to obtain a copy of your lab results for your physician.  Physical Therapy  ??? If physical therapy is recommended, notify the office of where you would prefer to seek therapy.  ??? Before doing so, verify with the physical therapy location that your insurance will be accepted.  ??? If authorization is required, allow 5-7 business days for the office to obtain authorization.  ??? If physical therapy is completed at a [facility] facility, the progress reports will automatically be sent to the physician.  ??? Physical therapy requires multiple visits to your chosen facility, anywhere from 2 weeks and more depending on the nature of your case.  Nerve Conduction/EMG Study  ??? If the physician ordered a Nerve Conduction or EMG Study, contact the Refer to Physician Office to schedule your appointment.      If you develop any severe worsening pain, new weakness or numbness please contact the office or present to the Emergency Department.  If you experience new loss of bowel or bladder you should go to the Emergency Department.

## 2020-08-13 ENCOUNTER — Ambulatory Visit: Payer: MEDICAID

## 2020-08-16 ENCOUNTER — Ambulatory Visit: Payer: MEDICARE

## 2020-08-16 ENCOUNTER — Ambulatory Visit: Payer: MEDICAID

## 2020-08-16 ENCOUNTER — Ambulatory Visit: Payer: BLUE CROSS/BLUE SHIELD

## 2020-08-16 DIAGNOSIS — Z124 Encounter for screening for malignant neoplasm of cervix: Secondary | ICD-10-CM

## 2020-08-16 DIAGNOSIS — Z1231 Encounter for screening mammogram for malignant neoplasm of breast: Secondary | ICD-10-CM

## 2020-08-16 NOTE — H&P
Gynecology Outpatient History & Physical    CC: fibroids    HPI: 60 y.o. PMP [id number] presents with fibroids. Patient states that she was first dx'd with fibroids in 2019 during evaluation performed for PMB. EMB was done at that time and was benign per patient. She reports occasional PMB since that time, at least a few times per year, usually manifesting as a coin-sized drop of blood in the toilet when voiding. Patient recently developed R hip pain so pelvic ultrasound was ordered, demonstrating known fibroids. Her hip pain was subsequently determined to be neurologic in etiology and has improved s/p epidural steroid injection earlier this month for cervical spinal stenosis. She presents now for further evaluation of her fibroids. Denies F/C, abdominal/pelvic pain, abnormal discharge per vagina, GI/urinary sx.    PMHx:  Cervical spinal stenosis with myeloradiculopathy, previously on gabapentin but has not needed it since epidural steroid injection on 07/28/20  DM, currently managed with diet and exercise, previously on metformin  Eczema  HLD, currently not on medications, atorvastatin rx'd last month but patient has not started it because she is trying to manage it with diet and exercise  HTN, has not taken medication x past several days because she feels that her blood pressure has been intermittently low (DBP 50s)  Hypothyroidism  OSA, uses CPAP machine every night    PSHx:  Open cholecystectomy, 1987  Incisional hernia repair with mesh, 1987  C/D x 2  D&C x 3    OBHx:  G1: 1980, NSVD @ term, M, 7 lb 7 oz  G2: 1984, 1st trimester TAB s/p D&C  G3: 1985, 1st trimester TAB s/p D&C  G4: 1986, C/D @ 26w for breech following PPROM, F, 2 lb 6 oz  G5: 1987, 1st trimester TAB s/p D&C  G6: 1995, C/D @ term for repeat, M, 7.5 lb    GynHx:  Denies h/o abnormal Paps, most recent in 10-04/2020 per patient  Denies h/o abnormal MMG, most recent 1-2 years ago per patient  Denies h/o STIs  Menarche @ 30, FMP in 2018    Medications:  Acetaminophen 500 mg PO PRN  Clobetasol 0.05% ointment BID PRN  Levothyroxine 100 mcg PO qday  Olmesartan-hydrochlorothiazide 40-25 mg 0.5 tab PO qday (has not taken x past several days as above)    Allergies: iodine contrast (anaphylaxis)    SocHx:  Denies T/E/D use  Lives with daughter  Does not work    Engineer, maintenance (IT):  Mother deceased 2/2 MVA with drunk driver  Father deceased 2/2 lung CA (heavy smoker)  Sister with DM  PA deceased 2/2 ?breast CA in 64s  Denies family h/o HTN, colon/gyn CA, bleeding/clotting d/o    ROS: 14-point ROS performed and negative except as above    PE:  VS: BP 129/75  ~ Pulse 69  ~ Ht 5' 2'' (1.575 m)  ~ Wt (!) 253 lb (114.8 kg)  ~ BMI 46.27 kg/m???   General: WDWN female in NAD  Abdomen: +BS, soft, NTTP, ND, no hepatosplenomegaly, no palpable masses/hernias  Lymphatic: no inguinal LAD  Neurologic/Psychiatric: A&O x 4, mood/affect appropriate  Pelvic:  - External genitalia: atrophic, no lesions/masses  - Urethra/bladder: urethral meatus normal in location and appearance, urethra/bladder NTTP with no palpable masses  - Vagina: atrophic, no lesions/masses, no blood/abnormal discharge  - Cervix: no lesions/masses, NTTP  - Uterus: BME limited 2/2 habitus, NTTP, no palpable masses  - Adnexa/parametria: BME limited 2/2 habitus, NTTP, no palpable masses  - Anus/perineum: no skin changes,  no lesions/masses    Labs:  Hgb A1c (06/29/20): 6.7    Imaging:  Pelvic U/S (08/06/20): Uterus 9.4 x 7.8 x 5.3 cm. Myometrium homogenous. Multiple fibroids, including 5.8 cm intramural/subserosal fibroid in R body and 3.7 cm subserosal/intramural fibroid in anterior fundus. EEC 3.1 mm. No focal endometrial lesions. B ovaries not seen. Free fluid.    A/P: 60 y.o. PMP [id number] with PMB and fibroids.    # PMB:  - Discussed that any bleeding per vagina after menopause should not be considered normal and should be evaluated. Endometrial hyperplasia/malignancy should specifically be ruled out. Please see below re: discussion of PMB and fibroids.  - Patient stated that an outside provider performed an EMB in 2019 and that it was benign. OSR requested. Discussed that, given that PMB has persisted x now 3 years since that EMB, repeat endometrial sampling should be considered. Patient recalls the EMB being very painful and requests that endometrial sampling, if necessary, be performed under anesthesia. Discussed that patient's multiple medical comorbidities make anesthesia relatively risky for a procedure that can take only 1-2 minutes in the office. Discussed that measures such as a paracervical block can be performed to make office EMB less painful. Patient reports that she plans to undergo surgery in the near future for management of her cervical myeloradiculopathy and asks if endometrial sampling can be performed while she is already under anesthesia for that procedure. I will reach out to patient's neurosurgeon Dr. Lenna Gilford to determine feasibility.  - Pap with HPV co-testing done.  - Dicussed return precautions.    # Fibroids:  - Pelvic ultrasound report reviewed in [facility] and images reviewed in PACS. All results reviewed with patient.  - Discussed that fibroids, also called leiomyomas or myomas, are common benign growths that develop from the muscular tissue of the uterus and do not require treatment unless they cause bothersome symptoms. Discussed that fibroids can cause abnormal uterine bleeding (with or without anemia), pelvic pressure and pain (caused by uterine bulk and, occasionally, leiomyoma degeneration), and urinary and gastrointestinal symptoms (e.g., urinary frequency, incontinence, hydronephrosis, and/or constipation resulting from uterine mass effect on the urinary and gastrointestinal systems).  - Discussed that only fibroids that contact the endometrial cavity (FIGO types 0, 1, 2, and often 3) can cause AUB, although it is not common for fibroids to cause new-onset uterine bleeding after menopause. Patient's pelvic ultrasound report does not comment on submucosal fibroids although review of pelvic ultrasound images in PACS does reveal possible small fibroids with possible submucosal component(s). Pelvic MRI can be considered for better characterization.  - Discussed that patient's fibroids and overall uterus are relatively small in size and should not cause hip pain nor other bulk sx.  - Recommended expectant management at this time pending further evaluation of PMB as above.    # Health maintenance:  - Cervical cancer screening: Pap with HPV co-testing done  - Breast cancer screening: CBE WNL, MMG ordered  - Vaccines: s/p COVID-19 [employer]) x 3 11/12/19, 12/03/19, 07/06/20    # Dispo: follow-up pending receipt of OSR and discussion with patient's neurosurgeon    I personally spent a total of >60 minutes on the date of this encounter, including both face-to-face and non-face-to-face time used for preparing to see the patient, including review of tests; obtaining and/or reviewing patient history; performing a medically-appropriate examination and/or evaluation; counseling and educating the patient, family, and/or caregiver; ordering medications, tests, and/or procedures; referring to and/or communicating with other healthcare professionals; documenting clinical  information in the electronic medical record; interpreting and communicating results to the patient, family, and/or caregiver; and coordinating care.    Caleesi Kohl D. Courtnee Myer, MD  08/16/2020  11:32 AM

## 2020-08-17 DIAGNOSIS — D219 Benign neoplasm of connective and other soft tissue, unspecified: Secondary | ICD-10-CM

## 2020-08-17 DIAGNOSIS — N950 Postmenopausal bleeding: Secondary | ICD-10-CM

## 2020-08-18 ENCOUNTER — Ambulatory Visit: Payer: PRIVATE HEALTH INSURANCE

## 2020-08-18 ENCOUNTER — Telehealth: Payer: PRIVATE HEALTH INSURANCE

## 2020-08-18 DIAGNOSIS — Z9189 Other specified personal risk factors, not elsewhere classified: Secondary | ICD-10-CM

## 2020-08-18 NOTE — H&P
OUTPATIENT HISTORY AND PHYSICAL   HEAD AND NECK SURGERY  Attending: Marcelline Mates, MD, PhD    Date of Service: 08/18/2020      Patient: Chelsea [name]   MR: [id number]         Chelsea [name] is a 60 y.o. female referred by Dr. Clint Lipps who presents for evaluation of difficult intubation.  She recently saw Dr. Sonda Primes in [facility] clinic here in preparation for an upcoming spinal surgery  .  Had surgery in [place] 2013 with difficult /traumatic intubation, caused surgery to be aborted. She brings the op note which mentions difficult epiglottis, and bleeding which obscured airway. LMA was placed. Glidescope intubation was recommended.    Since then she has lost 50 pounds.  Had MRI [facility] 2022 showed degenerative disc disease, no other apparent abnormality in paraspinal tissues.    Has OSA -uses CPAP. AHI was 84 on sleep study in June 2021.    Denies recent voice change. Notes she has always had a somewhat abnormal voice.    REVIEW OF SYSTEMS:    A full 14 point review of systems was performed, noted and scanned into [facility]. Pertinent positives and negatives are noted above.     Current Outpatient Medications   Medication Sig   ??? ciclopirox 8% solution Apply topically at bedtime Apply to adjacent skin and affected nails daily. Remove with alcohol every 7 days. Use up to 48 weeks.Marland Kitchen   ??? clobetasol 0.05% ointment Apply topically two (2) times daily APPLY AND GENTLY MASSAGE INTO AFFECTED AREA(S) TWICE DAILY.   ??? gabapentin 100 mg capsule    ??? gabapentin 300 mg capsule take 1 capsule by mouth five times a day   ??? levothyroxine 100 mcg tablet take 1 tablet by mouth every morning before breakfast   ??? olmesartan-hydroCHLOROthiazide 40-25 mg tablet Take 0.5 tablets by mouth daily.     No current facility-administered medications for this visit.         Allergies   Allergen Reactions   ??? Iodinated Diagnostic Agents Anaphylaxis       Past Medical History:   Diagnosis Date   ??? Abnormal cardiovascular stress test 06/21/2020    06/21/2020 Preop exam showed abnormal stress test with no EKG e/o ischemia but e/o large anterior wall ischemia with recommendation for cardiac catheterization. Had second opinion with repeat exercise nuclear stress test showing no e/o ischemia and was recommended against cardiac catheterization. I recommended starting Atorvastatin 40 mg every day for her DM2, but patient wishes to repeat labs fir   ??? Diabetes mellitus (HCC/RAF) 02/20/2017    06/21/2020 Chronic, stable. Last HgA1c 6.6% on 06/03/19. C/w MFM 500 mg BID. Repeat HgA1c. Recommended starting Atorvastatin 40 mg every day but patient wishes to repeat labs first. Will need to clarify last retinopathy screening at next visit.    ??? Hypertension, essential 06/21/2020    06/21/2020 Chronic,s table. C/w Benicar 40/25 mg x1/2 tablet every day. Reassess at next in office appointment.    ??? Hypothyroidism 06/21/2020   ??? Myelopathy concurrent with and due to spinal stenosis of cervical region (HCC/RAF) 06/21/2020    06/21/2020 Radiculopathy and myelopathy symptoms since forehead injury 08/25/19. MRI cervical spine 09/30/19 showing multilevel degenerative changes and central disk/osteophyte protrusion impinging the spinal cord with severe spinal stenosis and severe BL foraminal stenosis. Originally recommended for surgical intervention, which was then cancelled due to abnormal preoperative stress test. Neurosurge   ??? OSA (obstructive sleep apnea) 06/21/2020    06/21/2020 Chronic, stable. C/w  CPAP at bedtime.        Past Surgical History:   Procedure Laterality Date   ??? CESAREAN SECTION      x2   ??? CHOLECYSTECTOMY  1987   ??? INCISIONAL HERNIA REPAIR  1987       No family history on file.    Social History     Tobacco Use   Smoking Status Never Smoker   Smokeless Tobacco Never Used         PHYSICAL EXAM:   General: The patient appears well-nourished and in no acute distress.  Eyes: No proptosis or chemosis, conjugate gaze, full ocular movements.  Face: Facial nerve function is normal. Parotid glands are without swelling or masses.  Ears: External inspection of the ears reveals no deformity or mastoid tenderness. Internal otoscopy shows intact and clear tympanic membranes bilaterally.   Nose: Externally the nose is without deformity. Anterior rhinoscopy reveals no pus, polyps, septal perforation, or lesions.   Oral cavity: Tongue is midline and mobile. There are no lesions of the buccal mucosa, lips, or gums. Oropharynx: No tonsillar mass, hypertrophy, or exudate, or peritonsillar abscess.Oropharynx is narrow, tonsils 2+. Tongue is not large.  Neck: No palpable cervical lymphadenopathy or neck masses. Trachea is midline.  Thyroid:  No palpable thyroid nodules.   Larynx: Cartilage landmarks and laryngeal mobility are normal.   Respirations: Without difficulty or stridor.   Cardiovascular: There is no carotid thrill or jugular venous distention.   Psychiatric: Appropriate mood and affect.   Neurologic: Normal gait and gross limb movements.   Voice: Voice quality is high pitched and mildly strained        PROCEDURES:   Videolaryngoscopy with Stroboscopy       Procedure: The patient was seated in the examination chair.  A 70 degree telescopic laryngoscope or a flexible transnasal laryngoscope was attached to an endoscopic video CCD camera that fed its output to a monitor and recording unit.  The lighting was obtained via a liquid filled cable connected to a stroboscopic unit.  The telescope was inserted into the oropharynx until the vocal folds could be visualized.  The patient was instructed to sustain the vowel  ???ee??? at a comfortable pitch and loudness as the voice was monitored by a microphone connected to a fundamental frequency tracker.  This circuit tracked vocal periodicity, allowing the light to flash in synchrony with the glottal cycles.  A setting on the stroboscope was set to change the phase of light strobing with relation to the vocal fundamental frequency, producing an image of 1 1/2 glottal cycles every second.  The video images were recorded on the unit for later analysis.  Use of the variable speed, slow and stop scan available on the unit allowed careful study of pertinent segments to increase accuracy of judgments.  Features of glottal closure, mucosal wave on the vocal fold cover and symmetry of both vocal folds were analyzed.      Findings:   Videolaryngostroboscopy reveals:  Elongated, stiff-appearing epiglottis -difficult to maneuver flexible scope around epiglottis.  The cuneiform and corniculate cartilages are unusually prominent, especially on right side  Vocal fold mobility: normal  Posterior commissure: edema  Masses: none   Pooling of secretions: none        Epiglottis      Prominent arytenoid cartilages      Glottis (not fully abducted)    IMPRESSION/PLAN:   Chelsea [name] is being seen for the following:    1. History of difficult  intubation  -Her epiglottic and supraglottic anatomy are unusual.   Plan:  -I agree that Glidescope intubation is most likely to be successful.   -Would not recommend fiberoptic intubation, given the difficulty obtaining direct view with flexible scope while sitting upright in the office.    2. Severe OSA  Uses CPAP, but she requests consideration of surgery to improve her symptoms.  Given her unusual anatomy, DISE might be helpful to assess for sites of obstruction. Refer to Dr. Clair Gulling.     RETURN VISIT:  as needed.       60 minutes were spent personally by me on the day of this encounter which include same-day pre-visit review of the chart, obtaining appropriate history, performing an evaluation, documentation and discussion of management with details supported within the note for the visit. The time documented was exclusive of any time spent on the separately billed procedure.

## 2020-08-18 NOTE — Telephone Encounter
Patient walked in requesting to get blood pressure check in office because she stopped taking blood pressure medication, olmesartan-hydroCHLOROthiazide 40-25 mg tablet. She stated she stopped taking medication on Friday 08/13/2020. She started feeling dizzy 2 days ago but she mentioned that she got glasses 2 days ago as well, also stated she experience anxiety today. Blood pressure in office 140/90 with pulse 64. Offered an appointment to be seen with a physician but stated she couldn't this morning because she had another appointment, also offered an appointment with Dr Minna Antis today at 3:15pm and she stated she would call back to see if she can do that appointment. Patient then scheduled an appointment for 08/27/2020 with Dr Minna Antis and would call back if she do the 3:15pm on 08/18/2020.  Blood pressure reviewed with Dr Minna Antis and advise for patient to start blood pressure medication again, I informed patient and she verbally agreed.

## 2020-08-18 NOTE — Patient Instructions
To schedule a clinic visit with Dr. Long,   please call [phone] option 1 (Appts)    Jennifer Long M.D., PhD  Associate Professor   Head and Neck Surgery   Laryngology and Voice Disorders

## 2020-08-18 NOTE — Telephone Encounter
Patient requested second blood pressure taken  B/p: 143/82 p: 62

## 2020-08-19 ENCOUNTER — Ambulatory Visit: Payer: BLUE CROSS/BLUE SHIELD | Attending: Student in an Organized Health Care Education/Training Program

## 2020-08-19 ENCOUNTER — Ambulatory Visit: Payer: BLUE CROSS/BLUE SHIELD

## 2020-08-19 ENCOUNTER — Telehealth: Payer: PRIVATE HEALTH INSURANCE | Attending: Student in an Organized Health Care Education/Training Program

## 2020-08-19 ENCOUNTER — Ambulatory Visit: Payer: MEDICAID

## 2020-08-19 ENCOUNTER — Ambulatory Visit: Payer: PRIVATE HEALTH INSURANCE

## 2020-08-19 DIAGNOSIS — R42 Dizziness and giddiness: Secondary | ICD-10-CM

## 2020-08-19 NOTE — Progress Notes
Patient Consent to Telehealth   The patient agreed to participate in the video visit prior to joining the visit.      [facility] INTERNAL MEDICINE & PEDIATRICS  [facility] [address]  [address]  [place] [address]  Phone: [phone]  FAX: [phone]    Telemedicine Video Visit  Subjective:     Patient Active Problem List   Diagnosis   ??? Diabetes mellitus (HCC/RAF)   ??? Abnormal cardiovascular stress test   ??? OSA (obstructive sleep apnea)   ??? Myelopathy concurrent with and due to spinal stenosis of cervical region (HCC/RAF)   ??? Hypertension, essential   ??? Hypothyroidism   ??? Morbid (severe) obesity due to excess calories (HCC/RAF)   ??? Candidate for statin therapy due to risk of future cardiovascular event       HPI:   Chelsea [name] is a 60 y.o. female with the above issues who presents for   Chief Complaint   Patient presents with   ??? Dizziness     States that after her OB appt 3 days ago, she was bending down and all the sudden she became dizzy. States that she felt like she was unsteady, off balance, thinks maybe she was more lightheaded than vertigo-like. Started getting scared and so she sat down and symptoms resolved after less than 1 min.  Since then, lightheadedness occurs only when she's moving around. Doesn't have symptoms when sitting or moving her upper body quickly on either side.   Has also HA, grade 3/10 on bilateral temples. It comes and goes.    Stopped BP meds on Friday and DBP was in 50s and she was feeling really cold. Didn't have lightheadedness then  Went to her PCP's office yesterday and SBP was in 140s, so she was advised to restart the HTN meds.  Restarted olmsartan-hydrochlorothiazide   Has hx of head concussion after trauma to her forehead in 2021. Worried this is connected to that.     States that she has not been eating or drinking enough recently because she started a diet plan recently.   States that after her cervical radiculopathy injection her pain has significantly improved.   States she doesn't have a BP machine at home  Can check her BS at the time.     Past Medical History:   Diagnosis Date   ??? Abnormal cardiovascular stress test 06/21/2020    06/21/2020 Preop exam showed abnormal stress test with no EKG e/o ischemia but e/o large anterior wall ischemia with recommendation for cardiac catheterization. Had second opinion with repeat exercise nuclear stress test showing no e/o ischemia and was recommended against cardiac catheterization. I recommended starting Atorvastatin 40 mg every day for her DM2, but patient wishes to repeat labs fir   ??? Diabetes mellitus (HCC/RAF) 02/20/2017    06/21/2020 Chronic, stable. Last HgA1c 6.6% on 06/03/19. C/w MFM 500 mg BID. Repeat HgA1c. Recommended starting Atorvastatin 40 mg every day but patient wishes to repeat labs first. Will need to clarify last retinopathy screening at next visit.    ??? Hypertension, essential 06/21/2020    06/21/2020 Chronic,s table. C/w Benicar 40/25 mg x1/2 tablet every day. Reassess at next in office appointment.    ??? Hypothyroidism 06/21/2020   ??? Myelopathy concurrent with and due to spinal stenosis of cervical region (HCC/RAF) 06/21/2020    06/21/2020 Radiculopathy and myelopathy symptoms since forehead injury 08/25/19. MRI cervical spine 09/30/19 showing multilevel degenerative changes and central disk/osteophyte protrusion  impinging the spinal cord with severe spinal stenosis and severe BL foraminal stenosis. Originally recommended for surgical intervention, which was then cancelled due to abnormal preoperative stress test. Neurosurge   ??? OSA (obstructive sleep apnea) 06/21/2020    06/21/2020 Chronic, stable. C/w CPAP at bedtime.      Past Surgical History:   Procedure Laterality Date   ??? CESAREAN SECTION      x2   ??? CHOLECYSTECTOMY  1987   ??? INCISIONAL HERNIA REPAIR  1987       Allergies   Allergen Reactions   ??? Iodinated Diagnostic Agents Anaphylaxis       Medications/Supplements  Medications that the patient states to be currently taking   Medication Sig   ??? ciclopirox 8% solution Apply topically at bedtime Apply to adjacent skin and affected nails daily. Remove with alcohol every 7 days. Use up to 48 weeks.Marland Kitchen   ??? clobetasol 0.05% ointment Apply topically two (2) times daily APPLY AND GENTLY MASSAGE INTO AFFECTED AREA(S) TWICE DAILY.   ??? hydrocortisone 1% cream Apply topically.   ??? levothyroxine 100 mcg tablet take 1 tablet by mouth every morning before breakfast   ??? olmesartan-hydroCHLOROthiazide 40-25 mg tablet Take 0.5 tablets by mouth daily.       Objective:     Physical Exam   [x]  Not ill appearing during video visit, appears comfortable    []  ill appearing during video visit   Other:     Laboratory Data: Reviewed by me with pertinent findings below.    Lab Results   Component Value Date    WBC 6.09 06/29/2020    HGB 13.1 06/29/2020    HCT 43.7 06/29/2020    PLT 263 06/29/2020     Lab Results   Component Value Date    NA 139 06/29/2020    K 4.0 06/29/2020    CL 102 06/29/2020    CO2 27 06/29/2020    BUN 12 06/29/2020    CREAT 0.64 06/29/2020    GLUCOSE 110 (H) 06/29/2020    CALCIUM 9.3 06/29/2020     No results found for: APTT, PT, INR  Lab Results   Component Value Date    ALT 20 06/29/2020    AST 16 06/29/2020    BILITOT 0.3 06/29/2020    ALKPHOS 85 06/29/2020    ALBUMIN 4.0 06/29/2020     Lab Results   Component Value Date    TSH 2.2 06/29/2020    HGBA1C 6.7 (H) 06/29/2020     Lab Results   Component Value Date    CHOL 185 06/29/2020    CHOLHDL 44 (L) 06/29/2020    CHOLDLCAL 120 (H) 06/29/2020    TRIGLY 107 06/29/2020       Assessment/Plan:     Diagnoses  1. Dizziness      Discussed with patient it's unclear on a video exam what her symptoms are from but from her history she could have orthostatic hypotension given sx present only when she's standing up. However, the unsteady/off balance feeling she has could also be vertigo, given it's transient and lasts less than 1 min only, especially given she thinks it's unclear whether she's more lightheaded vs off balance. She recently started new diet and eating/drinking less, could also be contributing.   - Discussed drinking at least 2 L water per day and eating regular meals  - Advised to check her blood sugar during the episodes. She unfortunately cannot check her BPs at home.  - Would benefit  from neurological exam and orthostatic vitals during in person exam  - Advised to get in person exam if symptoms are persistent despite increasing hydration/eating.   - Advised to f/u with PCP in 1 wk. She states she might go to urgent care instead either today or tomorrow because she's very concerned about this. Nearest IC information given    There are no Patient Instructions on file for this visit.    No follow-ups on file.    Future Appointments   Date Time Provider Department Center   08/20/2020  9:45 AM Maisie Fus., MD [facility] [place]   08/25/2020  9:00 AM Dellia Cloud., MD [facility]   08/27/2020  3:15 PM Clint Lipps, DO [id number] MEDICINE   09/06/2020  8:00 AM Bobbe Medico., MD [facility]   09/16/2020  2:30 PM Mcarthur Rossetti., MD [facility]   09/30/2020 10:15 AM Clint Lipps, DO [facility] [phone] MEDICINE       The above plan of care, diagnosis, orders, and follow-up were discussed with the patient. Questions related to this recommended plan of care were answered. Patient voiced understanding and agrees with this plan.     Encounter and documentation review time spent: 27 minutes.    Kandace Blitz. Regino Schultze, MD  08/19/2020 at 3:31 PM

## 2020-08-19 NOTE — Nursing Note
Called pt at [phone], no answer, was not able to leave a voicemail message. Phone just cut off. 1st attempt.

## 2020-08-19 NOTE — Nursing Note
Spoke with pt, pt will log into video visit in 5 minutes from now.

## 2020-08-20 ENCOUNTER — Telehealth: Payer: BLUE CROSS/BLUE SHIELD

## 2020-08-20 ENCOUNTER — Ambulatory Visit: Payer: BLUE CROSS/BLUE SHIELD

## 2020-08-20 DIAGNOSIS — L309 Dermatitis, unspecified: Secondary | ICD-10-CM

## 2020-08-20 DIAGNOSIS — L853 Xerosis cutis: Secondary | ICD-10-CM

## 2020-08-20 LAB — HPV DNA PCR: OTHER HIGH RISK HPV: NEGATIVE

## 2020-08-20 MED ORDER — CLOBETASOL PROPIONATE 0.05 % EX OINT
Freq: Two times a day (BID) | TOPICAL | 1 refills | Status: AC
Start: 2020-08-20 — End: ?

## 2020-08-20 NOTE — Patient Instructions
Wet Wraps for Atopic Dermatitis (Eczema)    Wet-wrap therapy can dramatically help control moderate to severe eczema. It involves wrapping wet clothes around the affected skin before bedtime with medication.     1. After bathing, gently pat the skin partially dry with a towel.   2. Apply moisturizer and topical steroid (clobetasol ointment) to the affected areas.  3. Find two pairs of cotton, long-sleeved pajamas. Moisten one pair and ring it out so it is only damp, then put this layer on your body. Then put on the second, dry pair of pajamas. This layer will wick away the moisture slowly overnight and help with comfort.   Make sure that there is good ventilation in the room and that the patient will not overheat or become too cool at nighttime.

## 2020-08-20 NOTE — Progress Notes
discard

## 2020-08-20 NOTE — Progress Notes
Patient Exam  ?  Chief complaint: hand derm f/u  ?  History of present illness:  Chelsea [name], a 60 y.o. female f/u hand derm, occ using clobetasol with minimal improvement. Not moisturizing often and not after washing hands.     Patient denies any other new or changing lesions of concern.    ?PMH/Meds/All/Soc/Fam hx: Reviewed in [facility]    Personal Hx skin cancer:  Negative for melanoma and negative for non-melanoma skin cancer  Fam Hx skin cancer:  Negative for melanoma and negative for non-melanoma skin cancer     No new changes to PMH/Meds/All/Soc/Fam hx - please refer to today's intake form.  Patient Active Problem List   Diagnosis   ??? Diabetes mellitus (HCC/RAF)   ??? Abnormal cardiovascular stress test   ??? OSA (obstructive sleep apnea)   ??? Myelopathy concurrent with and due to spinal stenosis of cervical region (HCC/RAF)   ??? Hypertension, essential   ??? Hypothyroidism   ??? Morbid (severe) obesity due to excess calories (HCC/RAF)   ??? Candidate for statin therapy due to risk of future cardiovascular event     Outpatient Medications Prior to Visit   Medication Sig   ??? ciclopirox 8% solution Apply topically at bedtime Apply to adjacent skin and affected nails daily. Remove with alcohol every 7 days. Use up to 48 weeks.Marland Kitchen   ??? clobetasol 0.05% ointment Apply topically two (2) times daily APPLY AND GENTLY MASSAGE INTO AFFECTED AREA(S) TWICE DAILY.   ??? diclofenac Sodium 1% gel Apply 4 g topically. (Patient not taking: Reported on 08/19/2020.)   ??? diclofenac Sodium 1% gel apply 2 to 3 gram twice a day if needed (Patient not taking: Reported on 08/19/2020)   ??? gabapentin 100 mg capsule  (Patient not taking: Reported on 08/19/2020.)   ??? gabapentin 300 mg capsule take 1 capsule by mouth five times a day (Patient not taking: Reported on 08/19/2020)   ??? hydrocortisone 1% cream Apply topically.   ??? levothyroxine 100 mcg tablet take 1 tablet by mouth every morning before breakfast   ??? metoprolol succinate 25 mg 24 hr tablet take 1/2 tablet by mouth once daily (Patient not taking: Reported on 08/19/2020)   ??? olmesartan-hydroCHLOROthiazide 40-25 mg tablet Take 0.5 tablets by mouth daily.     No facility-administered medications prior to visit.     Allergies   Allergen Reactions   ??? Iodinated Diagnostic Agents Anaphylaxis     ?  Review of systems:   ?Constitutional: Negative  ENMT: Not assessed   Endo: Not assessed   Musculoskelatal: Not assessed   Skin otherwise Negative  Conjunctiva and Lids: Negative    Objective:   General appearance: well-developed/well nourished for stated age, no apparent distress.  Mental status: alert and oriented.   Mood: cooperative, pleasant.  Affect: normal  A examination of skin was performed on hands and ears  -Erythematous, scaly ill-defined patches distributed over the bilateral dorsal hands, with superficial erosions  -Diffuse dry, rough, cracked, scaly skin    Assessment/Plan:  1. Hand Dermatitis, acute flare  - reviewed benign but chronic nature of disease  - gentle skin care recommendations:  - decrease hand washing frequency  - apply emollient post-hand washing (e.g. Vaseline/petroleum jelly)  - topical steroid side effects and use for active lesions only was reviewed   - increase clobetesol 0.05%AA to BID   - wear plastic non-latex gloves for wet work (e.g. washing dishes, gardening); use cotton glove as an inner liner if needed to wick  away sweat  - start wet wraps  - refer to patch testing    Xerosis, flare  --Reviewed dry skin care including short, lukewarm baths (< 5 minutes/day).  Use fragrance-free hypoallergenic soap (such as Dove beauty bar for sensitive skin).  Apply generous amount of mild, hypoallergenic emollients in cream form (such as Aveeno, Cetaphil, Eucerin or Aquaphor) within 3 minutes of bathing and several times throughout the day as tolerated.    Sun protection strategies including sunscreen use with SPF 30+ and skin cancer risk factors reviewed.   ?  Follow-up in 1 month    Lorin Picket, M.D.  Dermatology

## 2020-08-21 LAB — Liquid-based pap smear: LAB AP GYN SPECIMEN ADEQUACY: ABSENT

## 2020-08-21 NOTE — Telephone Encounter
Left voicemail with patient to discuss possible surgical date.

## 2020-08-24 NOTE — Progress Notes
CLINICAL NUTRITION H&P    PATIENT: Chelsea [name]  MRN: [id number]  DOB: June 19, 1961  DATE OF SERVICE: 08/25/2020    REFERRING PRACTITIONER: Clint Lipps, DO  PRIMARY CARE PROVIDER: Clint Lipps, DO    REASON FOR REFERRAL:     CHIEF COMPLAINT:  Chief Complaint   Patient presents with   ??? New Consult     type 2 diabetes, concern with diet and A1c, cholesterol    ??? Nutrition Counseling     Patient Consent to Telehealth   The patient agreed to participate in the video visit prior to joining the visit.        The following history was generated by an interview with the patient as well as a review of medical records obtained in [facility] and [facility] Everywhere.    Subjective:      Chelsea [name] is a 60 y.o. female with HTN, HLD, OSA, hypothyroidism, diabetes who presents for consultation regarding nutrition recommendations.    Diabetes  - HbA1C 6.7% 06/2020, stable from prior overall  - Family hx diabetes in sister    HLD  - Wants to try to rely on lifestyle modification first before considering medication    Obesity  - Tends to yo-yo diet  - Lost 100 lbs 10 yrs ago, via weight loss program. Then had episode of depression, regained weight back  - In 2021, lost ~30 lbs (280->255 lbs) via lifestyle changes again, but progress stalled d/t injury/pain  - Received cervical radiculopathy injection, pain improved since  - Restarted [facility] program - mid 07/2020, lost 6-10 lbs since    Hypothyroidism  - On LT4 PO qday  - TSH wnl 06/2020    Dietary Recall: [facility] program  Breakfast:  Oatmeal (overnight, nut butter, berries,cinnamon) + egg whites (2)   Snack: Protein bar (pure protein) OR smoothie - almond milk + frozen fruit + spinach/kale   Lunch: Salmon/chicken + sweet potato + vegetables   Snack:      Protein bar or smoothie   Dinner:  same   Snack: Protein bar   Beverage:   water - 1-2L   No juice or soda  Green tea - unsweetened  No     - allergic to avocado, some nuts    Exercise: moving around more, starting to increase walking, trying to do yoga    Lab Results   Component Value Date    HGBA1C 6.7 (H) 06/29/2020        ANTHROPOMETRICS  (!) 115.7 kg (255 lb)  1.575 m (5' 2'')    BMI: Body mass index is 46.64 kg/m???.     Weight in (lb) to have BMI = 25: 136.4  Ideal body weight: 50.1 kg (110 lb 7.2 oz)  Adjusted ideal body weight: 76.3 kg (168 lb 4.3 oz)    RECENT WEIGHT HISTORY:  Wt Readings from Last 15 Encounters:   08/25/20 0808 (!) 115.7 kg (255 lb)   08/19/20 1440 (!) 113.4 kg (250 lb)   08/16/20 1100 (!) 114.8 kg (253 lb)   08/10/20 1312 (!) 115.5 kg (254 lb 9.6 oz)   08/02/20 0835 (!) 115.7 kg (255 lb)   07/28/20 1416 (!) 117.9 kg (259 lb 14.8 oz)   07/16/20 1338 (!) 117.9 kg (260 lb)   07/13/20 1417 (!) 117.5 kg (259 lb)   07/07/20 0905 (!) 117.6 kg (259 lb 3.2 oz)   07/01/20 0937 (!) 114.8 kg (253 lb)   06/21/20 0803 (!) 117.9 kg (260  lb)       ROS:     Additional ROS complete and negative unless reported otherwise above.    Past Medical History:     Past Medical History:   Diagnosis Date   ??? Abnormal cardiovascular stress test 06/21/2020    06/21/2020 Preop exam showed abnormal stress test with no EKG e/o ischemia but e/o large anterior wall ischemia with recommendation for cardiac catheterization. Had second opinion with repeat exercise nuclear stress test showing no e/o ischemia and was recommended against cardiac catheterization. I recommended starting Atorvastatin 40 mg every day for her DM2, but patient wishes to repeat labs fir   ??? Diabetes mellitus (HCC/RAF) 02/20/2017    06/21/2020 Chronic, stable. Last HgA1c 6.6% on 06/03/19. C/w MFM 500 mg BID. Repeat HgA1c. Recommended starting Atorvastatin 40 mg every day but patient wishes to repeat labs first. Will need to clarify last retinopathy screening at next visit.    ??? Hypertension, essential 06/21/2020    06/21/2020 Chronic,s table. C/w Benicar 40/25 mg x1/2 tablet every day. Reassess at next in office appointment.    ??? Hypothyroidism 06/21/2020   ??? Myelopathy concurrent with and due to spinal stenosis of cervical region (HCC/RAF) 06/21/2020    06/21/2020 Radiculopathy and myelopathy symptoms since forehead injury 08/25/19. MRI cervical spine 09/30/19 showing multilevel degenerative changes and central disk/osteophyte protrusion impinging the spinal cord with severe spinal stenosis and severe BL foraminal stenosis. Originally recommended for surgical intervention, which was then cancelled due to abnormal preoperative stress test. Neurosurge   ??? OSA (obstructive sleep apnea) 06/21/2020    06/21/2020 Chronic, stable. C/w CPAP at bedtime.         Past Surgical History:     Past Surgical History:   Procedure Laterality Date   ??? CESAREAN SECTION      x2   ??? CHOLECYSTECTOMY  1987   ??? INCISIONAL HERNIA REPAIR  1987        Medications:     Medications that the patient states to be currently taking   Medication Sig   ??? ciclopirox 8% solution Apply topically at bedtime Apply to adjacent skin and affected nails daily. Remove with alcohol every 7 days. Use up to 48 weeks.Marland Kitchen   ??? clobetasol 0.05% ointment Apply topically two (2) times daily APPLY AND GENTLY MASSAGE INTO AFFECTED AREA(S) TWICE DAILY.   ??? clobetasol 0.05% ointment Apply topically two (2) times daily APPLY AND GENTLY MASSAGE INTO AFFECTED AREA(S) TWICE DAILY. Do not apply to face, armpit, or groin.Marland Kitchen   ??? hydrocortisone 1% cream Apply topically.   ??? levothyroxine 100 mcg tablet take 1 tablet by mouth every morning before breakfast   ??? olmesartan-hydroCHLOROthiazide 40-25 mg tablet Take 0.5 tablets by mouth daily.        Allergies:     Allergies   Allergen Reactions   ??? Iodinated Diagnostic Agents Anaphylaxis        Social History:     Social History     Socioeconomic History   ??? Marital status: Single     Spouse name: Not on file   ??? Number of children: Not on file   ??? Years of education: Not on file   ??? Highest education level: Not on file   Occupational History   ??? Not on file   Tobacco Use   ??? Smoking status: Former Smoker     Packs/day: 0.25     Years: 5.00     Pack years: 1.25     Types: Cigarettes   ???  Smokeless tobacco: Never Used   Substance and Sexual Activity   ??? Alcohol use: Not Currently   ??? Drug use: Not on file   ??? Sexual activity: Not on file   Other Topics Concern   ??? Not on file   Social History Narrative   ??? Not on file     Social Determinants of Health     Physical Activity: Not on file   Stress: Not on file   Financial Resource Strain: Not on file        Family History:   No family history on file.     Objective:     Vitals: Ht 1.575 m (5' 2'')  ~ Wt (!) 115.7 kg (255 lb) Comment: per patient ~ BMI 46.64 kg/m???      Wt Readings from Last 3 Encounters:   08/25/20 (!) 115.7 kg (255 lb)   08/19/20 (!) 113.4 kg (250 lb)   08/16/20 (!) 114.8 kg (253 lb)     Body mass index is 46.64 kg/m???.    System Check if noted Positive or additional negative findings   Constit  [x]  NAD     Eyes  []  Conj/Lids []  Orbital fat pad absent      HENMT  []  External ears/nose    []  Gross Hearing []  frontal bossing []  prominent jaw   []  mucus membranes []  widening gap in between teeth  []   temporal wasting     Neck  []  Cervical fat pad present     Resp  []  Chest rises and falls symmetrically        CV  []  RRR, no m/r/g  []  Bilateral [place]     GI   []  Tenderness   []  Central adiposity present   []  Abdominal striae     MSK Specify site examined:    []  Inspect/palp []  ROM hand []  swelling         Skin  []  Inspection  []  Brittle nails      Neuro  []  CN2-12 intact grossly   []  Alert and oriented   []  Tremor   []  Difficulty going from sitting to standing or standing to sitting (proximal muscle weakness)     Psych  [x]  Good insight/judgement     [x]  Normal mood/affect    [x]  Gross cognition wnl           Labs/Studies:      CBC    Lab Results   Component Value Date    WBC 6.09 06/29/2020    HGB 13.1 06/29/2020    HCT 43.7 06/29/2020    MCV 93.8 06/29/2020    PLT 263 06/29/2020    BMP    Lab Results   Component Value Date    NA 139 06/29/2020    K 4.0 06/29/2020    CL 102 06/29/2020    CO2 27 06/29/2020    BUN 12 06/29/2020    CREAT 0.64 06/29/2020    GLUCOSE 110 (H) 06/29/2020     Ca/ Mg/ Phos/Zinc/Copper    Lab Results   Component Value Date    CALCIUM 9.3 06/29/2020    ALBUMIN 4.0 06/29/2020      Lipids    Lab Results   Component Value Date    CHOL 185 06/29/2020    TRIGLY 107 06/29/2020    CHOLHDL 44 (L) 06/29/2020    NOHDLCHOCAL 141 (H) 06/29/2020    CHOLDLCAL 120 (H) 06/29/2020    HbA1c  Lab Results   Component Value Date    HGBA1C 6.7 (H) 06/29/2020    No results for input(s): GLUCOSE in the last 72 hours. No results for input(s): GLUCOSEPOC in the last 72 hours. No results for input(s): INSULIN, CPEPTIDE in the last 72 hours.  PTH / Vitamin D    No results found for: VITD25OH, PTHINT     LFT    Lab Results   Component Value Date    ALT 20 06/29/2020    AST 16 06/29/2020    ALKPHOS 85 06/29/2020    BILITOT 0.3 06/29/2020     Creatinine    Lab Results   Component Value Date    CREAT 0.64 06/29/2020     Lab Results   Component Value Date    GFRESTNOAA >89 06/29/2020    GFRESTAA >89 06/29/2020        Endocrine    Lab Results   Component Value Date    TSH 2.2 06/29/2020      B Vitamins    No results found for: VITAMINB1, VITAMINB2, VITAMINB6, VITAMINB12, FOLATE, MEMALSER, HOMOCYS         Iron Panel    No results found for: RETICCNT, FE, TIBC, FEBINDSAT, FERRITIN, HAPTOGLOB, LDH, EPO Fat Soluble Vitamins    No results found for: VITAMINA, VITAMINE, INR       Assessment & Plan/Recommendations:     Corah Willeford is a a 60 y.o. female with:       1. Type 2 diabetes mellitus without complication, without long-term current use of insulin (HCC/RAF)     2. Class 3 severe obesity with body mass index (BMI) of 45.0 to 49.9 in adult, unspecified obesity type, unspecified whether serious comorbidity present (HCC/RAF)     3. OSA (obstructive sleep apnea)     4. Hypertension, essential          Body mass index is 46.64 kg/m???.    Plan/Recommendations:  I counseled the patient regarding an individualized meal plan with a focus on vegetables, lean protein, whole grains, degree of fruit and healthy fats, within the context of calorie restriction. We discussed the importance of lean protein combined with resistance exercise to help preserve lean mass as weight loss occurs.    - continue [employer] program  - protein goal 80-100g a day  - Protein powder examples: orgain or tera's whey - could add to smoothie   - See below for information re: diabetes  - Increase exercise as able  - Increase water intake to 2 liters a day  - consider fish oil 2000mg  EPA + DHA    RTC 6-8 weeks    Author:  Dellia Cloud, MD 08/25/2020 8:46 AM    Medical Decision Making addressed on 08/25/2020:      Number and Complexity of Problems Addressed at the Encounter:   []   1 or more chronic illness with exacerbation, progression, or side effects of treatment  [x]   2 or more stable chronic illness  []   1 undiagnosed new problem with uncertain diagnosis  []   1 acute illness with systemic symptoms  []   1 acute complicated injury     Review of Data: I have  [x]  Reviewed/ordered ? 3 unique laboratory, radiology, and/or diagnostic tests noted: BMP, A1C, TSH  []  Reviewed prior external notes and incorporated into patient assessment as noted  []  I have independently interpreted test performed by other physician(s) as noted   []  Discussed management or test interpretation with  external provider(s) as noted       Risk of Complication and or Morbidity or Mortality of Patient Management including Social Determinants of Health:   []   I deem the above diagnoses to have a risk of complication, morbidity or mortality of Moderate   []   Prescription drug management  []  The diagnosis or treatment of said conditions is significantly limited by social determinants of health as noted.     The above plan of care, diagnosis, orders, and follow-up were discussed with the patient.  Questions related to this recommended plan of care were answered.

## 2020-08-25 ENCOUNTER — Ambulatory Visit: Payer: BLUE CROSS/BLUE SHIELD

## 2020-08-25 ENCOUNTER — Telehealth: Payer: BLUE CROSS/BLUE SHIELD

## 2020-08-25 DIAGNOSIS — E119 Type 2 diabetes mellitus without complications: Secondary | ICD-10-CM

## 2020-08-25 DIAGNOSIS — Z6841 Body Mass Index (BMI) 40.0 and over, adult: Secondary | ICD-10-CM

## 2020-08-25 NOTE — Patient Instructions
After Visit Summary: 08/25/2020  Dear Chelsea [name],    - continue [employer] program  - protein goal 80-100g a day  - Protein powder examples: orgain or tera's whey - could add to smoothie   - See below for information re: diabetes  - Increase exercise as able  - Increase water intake to 2 liters a day  - consider fish oil 2000mg  EPA + DHA    Follow-up with Dr. Eben Burow in Clinical Nutrition in 6-8 weeks.    You can schedule an appointment in our office in [address] or call [phone].     [facility]  [facility]  [address] [place], [address]  [place], [place] [address]  [phone]    ===      Dietary/Lifestyle Recommendations for Diabetes    1) Eat smaller, more frequent meals and eat breakfast if you skip    2) Not all carbohydrates are the same!    Examples of carbohydrates that raise your blood sugar more rapidly include:  --  Processed foods that are high in sugar and flour such as bread, white rice, instant rice, bagels, sweetened breakfast cereals, cookies, chips, and pasta.    -- Russet potatoes, dried fruit, soda, and fruit juices, sugar-sweetened drinks  -- Jams, jellies, instant oatmeal, instant potatoes  -- Certain fruits like melons, pineapple, mangos, banana, watermelon, papaya   Overall, you should minimize these foods    Examples of carbohydrates that raise your blood sugar more slowly:  -- Legumes: beans  -- Starchy vegetables: winter squashes, sweet potatoes, peas, corn  -- Grains: whole grains (quinoa, brown rice, steel cut oats, rolled oats), 100% stone-ground whole wheat or pumpernickel bread  -- Certain fruits: cherries, grapefruit, apples, pears, plums, peaches, strawberries, oranges, grapes, kiwi, berries.  Overall, you should incorporate these foods instead of the processed/refined carbohydrates.    -- Non-starchy vegetables contain fewer carbohydrates and have less of an effect on your blood sugar.  These include broccoli, green beans, spinach, celery, cucumber, bell peppers, carrots etc.    3) When eating foods that contain carbohydrates, try to include foods that contain fiber, protein and/or fat which may help lower their effect on your blood sugar.    --For example, instead of eating a piece of fruit by itself, add nuts or nonfat plain [place] yogurt    4) Avoid sodas and sweetened beverages (juice, vitamin water, coconut water, sports drinks etc.)    5) Increase your fiber intake (see below on sources of dietary fiber)  -- It is preferred to get fiber from foods over supplements  -- When following a high fiber diet, please increase your water intake to prevent constipation (ideally 64 oz per day)    6) Nuts, in moderation, are a healthy source of fat and fiber.  In particular, almonds and pistachios have been shown to help with decreasing blood sugar in studies.   Limit yourself to a small handful of nuts as they are high in calories.    --- Almonds- 1 oz= 23 almonds= 162 calories  --- Pistachios 1 oz= 49 kernels= 158 calories    7) Cook at home as much as possible and prepare meals to take with you.  Meals at restaurants and fast-food locations are high in refined sugar, fat and calories.    8) Exercise: ideally 150 minutes/week of moderate intensity exercise  -- Include both cardio and weight/resistance training  -- If you are new to  weight/resistance training please work with a Building control surveyor to prevent injury     )If you are overweight or obese, weight loss will help decrease your risk of developing diabetes and improve fasting blood sugar    ) Set up your plate so that it is   1) 1/2 non-starchy vegetables  2) 1/4 whole grain or starchy vegetable  -- whole grains: brown rice, quinoa, barley, oats  -- starchy vegetable: corn, potato, peas  3) 1/4 lean protein: fish, chicken, [place], eggs (1 yolk) and egg whites      A helpful resources is the American Diabetes Association website:  http://www.diabetes.org/            What is Dietary Fiber?  All fiber comes from plants, bushes, vines or trees. Of course, the ones that we eat provide [place] with fruits, vegetables and grains. There are many different types of fiber but the three that are most important to the health of the body are:  Insoluble Fiber  This fiber does not dissolve in water, nor is it fermented by the bacteria residing in the colon. Rather, it retains water and in so doing, helps to promote a larger, bulkier and more regular bowel activity. This, in turn, may be important in preventing disorder such as diverticulosis and hemorrhoids, and in sweeping out certain toxins and cancer causing carcinogens. Sources of insoluble fiber are:  ??? whole grain wheat and other whole grains   ??? corn bran, including popcorn, unflavored and unsweetened   ??? nuts and seeds   ??? potatoes and the skins from most fruits from trees such as apples, bananas and avocados   ??? many green vegetables such as green beans, zucchini, celery and cauliflower   ??? some fruit plants such as tomatoes and kiwi  Soluble Fiber  These fibers are fermented or used by the colon bacteria as a food source or nourishment. When these good bacteria grow and thrive, many health benefits occur in both the colon and the body. Soluble fiber is present in some degree in most edible plant foods, but the ones with the most soluble fiber include:  ??? legumes such as peas and most beans, including soybeans   ??? oats, rye and barley   ??? many fruits such as berries, plums, apples bananas and pears   ??? certain vegetables such as broccoli and carrots   ??? most root vegetables   ??? psyllium husk supplement products      Decrease Added Sugars in Your Diet    The Institute of Medicine recommends that added sugars make up less than 25% of your total calories. To put that in perspective, just one 12-ounce can of sugar-sweetened soda per day would put almost everyone over that number, if not close to it.    Soda???s an obvious source, but the truth is added sugars can be found in almost everything these days, and they can be real saboteurs of healthy eating. Added sugar is little more than a source of empty calories that can lead to extra pounds and even obesity, which increases the risk for chronic diseases like diabetes, heart disease, and cancer.    1. Begin with beverages. Today, Americans consume 200 to 300 more calories per day than they did 30 years ago, and nearly half of those calories come from sugary drinks. (Sadly, the trend is spreading globally.) Neita Carp is the most recognized culprit as far as sugar-sweetened beverages are concerned, but other soft drinks like fruit punch, lemonades, and  energy drinks typically have as much added sugar as full-calorie soda. Cutting down from two per day to one can add up to 16 pounds of weight loss in a year.     2. Know your nicknames. Food manufacturers have come up with some pretty creative names for added sugar over the past few years.  These include brown sugar, corn sweetener, corn syrup, fruit juice concentrates, high fructose corn syrup, hoeny, molasses, dextrose, fructose, lactose, glucose, maltose, sucrose, syrup.    3. Read the Nutrition Panel. When comparing foods with added sugars such as cereals, breads and salad dressings, go for the ones that contain less than 3g of sugar per serving, or ones that list sugar as the 5th ingredient or later. Since ingredients are listed by weight, the later sugar appears on the list the less you???ll be eating.    4. Spot the sneaky sources. Foods don???t necessarily have to taste sweet to contain added sugar. In fact, sugar is present in most packaged foods, even the savory ones you???d never suspect. Some common sneaky sources of added sugar: salad dressing, Asian sauces, frozen dinners, cereals, and fruit spreads.     5. Swap in fruit. Instead of pouring a sugar-sweetened vinaigrette dressing all over your salad, add a natural pop of sweetness with fresh or unsweetened dried fruit. Same goes for yogurt: skip the fruit-at-the-bottom goop, and add fresh strawberries or banana to plain yogurt instead. Fruit is a wonderful, natural source of sugar that also provides your body with fiber, vitamins, and minerals.    6. Make dessert special.  If your meal needs a sweet finish, prep a big bowl of fruit salad on Sunday to help you curb any after-dinner cookie cravings during the week.  If you're craving a sweet and nothing else will satisfy your appetite, then allow yourself to have the dessert, but have a small portion and remind yourself that it is not the last time you will have it.    7. Realize it???s not all or nothing .Just because you want to ???stop eating added sugar??? doesn???t mean you need to cut out every last gram. Yes, you can still enjoy ketchup on your cheeseburger and your afternoon square of dark chocolate. To put a serious dent in decreasing your intake, start with the biggest sources, like soda, and foods you eat frequently, such as sandwich bread. Picking up no-sugar-added packaged foods, like pasta sauce and dressings, is another great place to start. Your tastebuds most likely won???t notice the difference???but over time your body will.    Source: LimitL[employer]cy

## 2020-08-25 NOTE — Nursing Note
Medical Specialty Video Visit     I have contacted the patient to ensure that he/she is ready for today's video visit.     [] Left a message on the patient's voicemail reminding patient of video visit with MD today.           Advised patient to download MyChart mobile app and complete the eCheck-in questionnaire and video consent.                     Provided MyChart Support Number: [phone]     [x] Patient has downloaded MyChart mobile app   [x] Patient has completed eCheck-in questionnaire   [x] Patient has completed Video Visit Consent   [x] Patient was instructed to join the Video Visit 5-10 minutes before the appointment,     [] I have assisted the patient in preparing for today's video visit with MD; downloading of MyChart mobile app and how to join Video Visit.     Vital Signs reported by patient.  Allergy and Medication reconciliation completed.    Refills requested:  [] Yes   [x] No   Pharmacy confirmed: [x] Yes  [] No    Nurse: Rihan Schueler Selene Bryne Lindon SR. LVN

## 2020-08-26 ENCOUNTER — Ambulatory Visit: Payer: MEDICAID

## 2020-08-27 ENCOUNTER — Telehealth: Payer: MEDICAID

## 2020-08-27 NOTE — Telephone Encounter
I spoke with patient and she stated that she wants to keep her appointment that is scheduled for Mon 08/30/2020 with Dr. Minna Antis.

## 2020-08-30 ENCOUNTER — Ambulatory Visit: Payer: MEDICAID

## 2020-08-31 ENCOUNTER — Ambulatory Visit: Payer: BLUE CROSS/BLUE SHIELD

## 2020-08-31 ENCOUNTER — Telehealth: Payer: BLUE CROSS/BLUE SHIELD

## 2020-08-31 ENCOUNTER — Ambulatory Visit: Payer: PRIVATE HEALTH INSURANCE

## 2020-08-31 DIAGNOSIS — S025XXA Fracture of tooth (traumatic), initial encounter for closed fracture: Secondary | ICD-10-CM

## 2020-08-31 DIAGNOSIS — N950 Postmenopausal bleeding: Secondary | ICD-10-CM

## 2020-08-31 DIAGNOSIS — E119 Type 2 diabetes mellitus without complications: Secondary | ICD-10-CM

## 2020-08-31 DIAGNOSIS — Z23 Encounter for immunization: Secondary | ICD-10-CM

## 2020-08-31 MED ORDER — ATORVASTATIN CALCIUM 10 MG PO TABS
10 mg | ORAL_TABLET | Freq: Every day | ORAL | 1 refills | Status: AC
Start: 2020-08-31 — End: 2020-09-09

## 2020-08-31 NOTE — Progress Notes
PATIENT: Chelsea [name]  MRN: [id number]  DOB: 1961/02/14  DATE OF SERVICE: 08/31/2020    CHIEF COMPLAINT:   Chief Complaint   Patient presents with   ??? Hypertension        HPI   Chelsea [name] is a 60 y.o. female who  has a past medical history of Abnormal cardiovascular stress test (06/21/2020), Diabetes mellitus (HCC/RAF) (02/20/2017), Hypertension, essential (06/21/2020), Hypothyroidism (06/21/2020), Myelopathy concurrent with and due to spinal stenosis of cervical region (HCC/RAF) (06/21/2020), and OSA (obstructive sleep apnea) (06/21/2020). who presents for   Chief Complaint   Patient presents with   ??? Hypertension     #HTN  Chronic, stable.???Benicar 40/25 mg x1/2 tablet every day  ???  #DM2  Diagnosed with HgA1c 9%. Currently on MFM 500 mg BID. Last HgA1c 6.6% on 06/03/19.  07/06/2020???Last HgA1c 6.7% on 06/29/20. Negative UACR.???Patient now reports that she is not taking any diabetic medications but she has lost 50 lbs since her initial diagnosis.???  08/31/2020 Not taking MFM. Seen by medical nutrition.   ???  #Obesity  Chronic, stable.???  ???  #HLD  2018 ACC/AHA guidelines recommends moderate or high-intensity statin because of diabetes. Patient is receiving guideline-directed statin therapy; monitor adherence.  10-Year ASCVD risk is 13.3% Continue moderate or high-intensity statin therapy. Monitor adherence as of 2:31 PM on 07/16/2020.  10-Year ASCVD risk with optimal risk factors is 2.4%.  Values used to calculate ASCVD score:  Age: 60 y.o.   Gender: Female          Race: Black  HDL cholesterol: 44 mg/dL. HDL cholesterol measured on 06/29/2020.  Total cholesterol: 185 mg/dL. Total cholesterol measured on 06/29/2020.  LDL cholesterol: 120 mg/dL. LDL cholesterol measured on 06/29/2020.  Systolic BP: 121 mm Hg. BP was measured on 07/13/2020.  The patient is being treated with a medication that influences SBP.  The patient is currently not a smoker.  The patient has a diagnosis of diabetes.  Click here for the [facility] ASCVD Cardiovascular Risk Estimator Plus tool Office manager).  ???  #Abnormal Cardiac Stress Test  Had abnormal stress as part of preop exam with cardiology showing no ischemic EKG changes but e/o large anterior wall ischemia. Recommended for angiogram and starting ASA 81 mg every day. She was seen by a second cardiologist who repeated an exercise nuclear stress test with no evidence of ischemia and recommended stopping aspirin and no need for angiogram. She would like a second opinion at [facility]. Denies CP, SOB, orthopnea. Has mild BL LE edema.   07/16/2020 Established care with Cardiology Dr. Theodoro Grist who ordered NM stress test.  08/31/2020 NM Treadmill Stress test with no e/o inducible ischemia.   ???  Not addressed this visit:  #OSA  Remains on CPAP at bedtime      #Cervical Radiculopathy with myelopathy  Onset since forehead injury 08/25/2019. Pain at neck with radiation down right arm and BL shoulders. Sleeps in neck brace. Lying supine worsens the pain. Associated muscle weakness and numbness in BL hands - can't open jars or buckle seat belts. Currently taking Gabapentin, which helps make the pain the manageable. Was originally planning on surgery but then had abnormal stress test as part of preop exam as detailed below.???  07/06/2020???Chronic, stable. Seen by neurosurg Dr. Konrad Felix who recommended repeating MRI in 1 month and possible plans for surgical intervention.???Seen by PMR who ordered medrol dose pack and consider ESI.???  07/16/2020 Chronic, stable. In a lot of pain this morning. On Meloxicam 15 mg every  day and Gabapentin.     #Hypothyroidism  S/p RAI in 30's.    Patient Care Team:  Clint Lipps, DO as PCP - General (Family Medicine)  Clint Lipps, DO as PCP - Plan Assigned [facility])    MEDS     Outpatient Medications Prior to Visit   Medication Sig   ??? ciclopirox 8% solution Apply topically at bedtime Apply to adjacent skin and affected nails daily. Remove with alcohol every 7 days. Use up to 48 weeks.Marland Kitchen   ??? clobetasol 0.05% ointment Apply topically two (2) times daily APPLY AND GENTLY MASSAGE INTO AFFECTED AREA(S) TWICE DAILY.   ??? clobetasol 0.05% ointment Apply topically two (2) times daily APPLY AND GENTLY MASSAGE INTO AFFECTED AREA(S) TWICE DAILY. Do not apply to face, armpit, or groin.Marland Kitchen   ??? hydrocortisone 1% cream Apply topically.   ??? levothyroxine 100 mcg tablet take 1 tablet by mouth every morning before breakfast   ??? metoprolol succinate 25 mg 24 hr tablet take 1/2 tablet by mouth once daily   ??? olmesartan-hydroCHLOROthiazide 40-25 mg tablet Take 0.5 tablets by mouth daily.   ??? diclofenac Sodium 1% gel Apply 4 g topically. (Patient not taking: Reported on 08/19/2020.)   ??? diclofenac Sodium 1% gel apply 2 to 3 gram twice a day if needed (Patient not taking: Reported on 08/19/2020)   ??? gabapentin 100 mg capsule  (Patient not taking: Reported on 08/19/2020.)   ??? gabapentin 300 mg capsule take 1 capsule by mouth five times a day (Patient not taking: Reported on 08/19/2020)     No facility-administered medications prior to visit.       PHYSICAL EXAM      Last Recorded Vital Signs:    08/31/20 0958   BP: 134/70   Pulse: 61   Resp: 18   Temp: 36.2 ???C (97.1 ???F)   SpO2: 99%     Body mass index is 46.13 kg/m???.  Wt Readings from Last 3 Encounters:   08/31/20 (!) 252 lb 3.2 oz (114.4 kg)   08/25/20 (!) 255 lb (115.7 kg)   08/19/20 (!) 250 lb (113.4 kg)      Gen - Awake. No acute distress. Age appropriate appearance.   Eyes - EOMI. No conjunctival pallor or injection. No scleral icterus.  Neck - Supple. No thyroid enlargement, tenderness, or nodules. No cervical, supraclavicular or infraclavicular lymphadenopathy.   Pulm - Nml effort. Clear to auscultation bilaterally. No wheezes, rales, or rhonchi.  Cards - RRR. S1 S2. No murmurs, rubs, or gallops.   GI - Soft. Nondistended. Nontender to palpation in all quadrants. No rebound, guarding, or rigidity.   MSK - No cyanosis, clubbing, or LE edema.   Neuro - AAOx3. CN2-12 grossly intact.  Psych - Normal mood and affect.       LABS/STUDIES   I have:   []  Reviewed/ordered []  1 []  2 []  ? 3 unique laboratory, radiology, and/or diagnostic tests noted below    []  Reviewed []  1 []  2 []  ? 3 prior external notes and incorporated into patient assessment    []  Discussed management or test interpretation with external provider(s) as noted       Lab Studies:  CBC:   Results for orders placed or performed in visit on 06/29/20   CBC   Result Value Ref Range    White Blood Cell Count 6.09 4.16 - 9.95 x10E3/uL    Red Blood Cell Count 4.66 3.96 - 5.09 x10E6/uL    Hemoglobin 13.1 11.6 -  15.2 g/dL    Hematocrit 45.4 09.8 - 45.2 %    Mean Corpuscular Volume 93.8 79.3 - 98.6 fL    Mean Corpuscular Hemoglobin 28.1 26.4 - 33.4 pg    MCH Concentration 30.0 (L) 31.5 - 35.5 g/dL    Red Cell Distribution Width-SD 44.5 36.9 - 48.3 fL    Red Cell Distribution Width-CV 12.9 11.1 - 15.5 %    Platelet Count, Auto 263 143 - 398 x10E3/uL    Mean Platelet Volume 10.3 9.3 - 13.0 fL    Nucleated RBC%, automated 0.0 No Ref. Range %    Absolute Nucleated RBC Count 0.00 0.00 - 0.00 x10E3/uL    Neutrophil Abs (Prelim) 3.78 See Absolute Neut Ct. x10E3/uL   Differential, Automated   Result Value Ref Range    Neutrophil Percent, Auto 62.0 No Ref. Range %    Lymphocyte Percent, Auto 30.4 No Ref. Range %    Monocyte Percent, Auto 4.8 No Ref. Range %    Eosinophil Percent, Auto 1.8 No Ref. Range %    Basophil Percent, Auto 0.8 No Ref. Range %    Immature Granulocytes% 0.2 No Reference Range %    Absolute Neut Count 3.78 1.80 - 6.90 x10E3/uL    Absolute Lymphocyte Count 1.85 1.30 - 3.40 x10E3/uL    Absolute Mono Count 0.29 0.20 - 0.80 x10E3/uL    Absolute Eos Count 0.11 0.00 - 0.50 x10E3/uL    Absolute Baso Count 0.05 0.00 - 0.10 x10E3/uL    Absolute Immature Gran Count 0.01 0.00 - 0.04 x10E3/uL   CBC & Auto Differential    Narrative    The following orders were created for panel order CBC & Auto Differential.  Procedure                               Abnormality Status                     ---------                               -----------         ------                     JXB[[id number]]                          Abnormal            Final result               Differential, Automated[[id number]]                          Final result                 Please view results for these tests on the individual orders.       CMP:   Results for orders placed or performed in visit on 06/29/20   Comprehensive Metabolic Panel   Result Value Ref Range    Sodium 139 135 - 146 mmol/L    Potassium 4.0 3.6 - 5.3 mmol/L    Chloride 102 96 - 106 mmol/L    Total CO2 27 20 - 30 mmol/L    Anion Gap 10 8 - 19 mmol/L    Glucose 110 (H) 65 -  99 mg/dL    Creatinine 1.61 0.96 - 1.30 mg/dL    GFR Estimate for African American >89 See GFR Additional Information mL/min/1.30m2    GFR Estimate for Non-African American >89 See GFR Additional Information mL/min/1.16m2    GFR Additional Information See Comment     Urea Nitrogen 12 7 - 22 mg/dL    Calcium 9.3 8.6 - 04.5 mg/dL    Total Protein 6.9 6.1 - 8.2 g/dL    Albumin 4.0 3.9 - 5.0 g/dL    Bilirubin,Total 0.3 0.1 - 1.2 mg/dL    Alkaline Phosphatase 85 37 - 113 U/L    Aspartate Aminotransferase 16 13 - 62 U/L    Alanine Aminotransferase 20 8 - 70 U/L     Hgb A1C:   Lab Results   Component Value Date/Time    HGBA1C 6.7 (H) 06/29/2020 08:19 AM     Lipids:   Results for orders placed or performed in visit on 06/29/20   Lipid Panel   Result Value Ref Range    Cholesterol 185 See Comment mg/dL    Cholesterol,LDL,Calc 120 (H) <100 mg/dL    Cholesterol, HDL 44 (L) >50 mg/dL    Triglycerides 409 <811 mg/dL    Non-HDL,Chol,Calc 914 (H) <130 mg/dL      TSH:   Lab Results   Component Value Date/Time    TSH 2.2 06/29/2020 08:19 AM        Imaging Studies:   NM Cardiac Stress Treadmill (07/23/20):   1. Normal exercise stress ECG study at an adequate cardiac workload.   2. Good exercise tolerance.   3. Baseline blood pressure category is elevated (stage I hypertension). Blood pressure response to stress was exaggerated.   4. Patient's symptoms were not suggestive of ischemia.   5. ECG findings are not suggestive of ischemia.   6. No exercise induced arrhythmias were noted.  Impression:  ???  1) Normal exercise stress myocardial perfusion imaging SPECT study at an adequate cardiac workload. There is no scintigraphic evidence of ischemia or prior myocardial infarction.  ???  2) Normal left ventricular size and systolic function. LVEF  65%.  ???  3) No previous cardiac nuclear stress test was available for comparison.  ???  4) Please see the separate cardiology report for physiologic and electrocardiographic findings.    A&P   Chelsea [name] is a 60 y.o. female presenting for   Chief Complaint   Patient presents with   ??? Hypertension         PROBLEM & ORDERS    ICD-10-CM    1. Hypertension, essential  I10    2. Encounter for administration of vaccine  Z23    3. Candidate for statin therapy due to risk of future cardiovascular event  Z91.89 atorvastatin 10 mg tablet   4. Closed fracture of tooth, initial encounter  S02.5XXA Referral to Dental   5. Myelopathy concurrent with and due to spinal stenosis of cervical region (HCC/RAF)  M48.02 DME [facility]    G99.2    6. Type 2 diabetes mellitus without complication, without long-term current use of insulin (HCC/RAF)  E11.9    7. Abnormal cardiovascular stress test  R94.39        ASSESSMENT  Problem List Items Addressed This Visit        HCC Codes    ??? Diabetes mellitus (HCC/RAF)     Overview      06/21/2020 Chronic, stable. Last HgA1c 6.6% on 06/03/19. C/w MFM 500 mg  BID. Repeat HgA1c. Recommended starting Atorvastatin 40 mg every day but patient wishes to repeat labs first. Will need to clarify last retinopathy screening at next visit.   07/06/2020 Chronic, stable. HgA1c 6.7% on 06/29/20 with negative UACR. Recommended resuming MFM XR 500 mg every day, declined at this time. Start Atorvastatin 20 mg every day with plans to uptitrate. Recommended pneumococcal vaccine, which was declined at this time. Ophtho referral for retinopathy screening.   07/16/2020 Chronic, stable. Did not resume MFM XR 500 mg every day. Reiterated my recommendation to restart MFM 500 mg every day. Recommended restarted Atorvastatin 20 mg every day.   08/31/2020 Chronic, stable. Patient declines medication. C/w diet and exercise. Has appt for retinopathy screening at [facility]. Recommended PPSV23, which was declined. Recommended moderate to high intesnity statin, which was declined. Therefore will start Atorvastatin 10 mg every day.          ??? Myelopathy concurrent with and due to spinal stenosis of cervical region (HCC/RAF)     Overview      06/21/2020 Radiculopathy and myelopathy symptoms since forehead injury 08/25/19. MRI cervical spine 09/30/19 showing multilevel degenerative changes and central disk/osteophyte protrusion impinging the spinal cord with severe spinal stenosis and severe BL foraminal stenosis. Originally recommended for surgical intervention, which was then cancelled due to abnormal preoperative stress test. Neurosurgery referral provided today. She has had a h/o difficult intubation in the past.   07/06/2020 Chronic, stable. Evaluated by neurosurg with plans to repeat MRI in 1 month and consider surgical intervention. Seen by PMR who prescribed medrol dose pack. C/w Gabapentin.   07/16/2020 Chronic, stable. MRI cervical spine with moderate to severe spinal canal stenosis and mild increased T2 signal suggestive of cord edema. Medrol dosepack and Meloxicam 7.5 mg every day + Gabapentin for pain control. Recommended urgent f/u with Neurosurg.   08/31/2020 Chronic, stable. S/p ESI by Dr. Vickki Hearing on 07/28/20 with significant improvement. Has f/u appt with Dr. Konrad Felix to discuss utility of surgery.             Other    ??? Abnormal cardiovascular stress test     Overview      06/21/2020 Preop exam showed abnormal stress test with no EKG e/o ischemia but e/o large anterior wall ischemia with recommendation for cardiac catheterization. Had second opinion with repeat exercise nuclear stress test showing no e/o ischemia and was recommended against cardiac catheterization. Seen by Cardiology Dr. Theodoro Grist with repeat NM treadmill stress test without e/o inducible ischemia.          ??? Candidate for statin therapy due to risk of future cardiovascular event     Overview      08/31/2020 Chronic, stable. Start Atorvastatin 10 mg every day; recommended moderate to high intensity statin but patient declined.          ??? Hypertension, essential     Overview      06/21/2020 Chronic,s table. C/w Benicar 40/25 mg x1/2 tablet every day. Reassess at next in office appointment.   08/31/2020  Chronic, stable. BP acceptable. C/w Benicar 40/25 mg x1/2 tablet every day.                  Diagnoses and all orders for this visit:    Hypertension, essential    Encounter for administration of vaccine    Candidate for statin therapy due to risk of future cardiovascular event  -     atorvastatin 10 mg tablet; Take 1 tablet (10 mg total)  by mouth daily.    Closed fracture of tooth, initial encounter  -     Referral to Dental    Myelopathy concurrent with and due to spinal stenosis of cervical region (HCC/RAF)  -     DME [facility]    Type 2 diabetes mellitus without complication, without long-term current use of insulin (HCC/RAF)    Abnormal cardiovascular stress test      Orders Placed This Encounter   ??? Referral to Dental   ??? atorvastatin 10 mg tablet   ??? DME [facility]       The above recommendation were discussed with the patient.  The patient has all questions answered satisfactorily and is in agreement with this recommended plan of care.        Clint Lipps, DO  Clinical Instructor  Department of Medicine  08/31/2020 10:24 AM

## 2020-09-01 MED ORDER — MEDROXYPROGESTERONE ACETATE 10 MG PO TABS
10 mg | ORAL_TABLET | Freq: Every day | ORAL | 3 refills | Status: AC
Start: 2020-09-01 — End: ?

## 2020-09-05 DIAGNOSIS — D219 Benign neoplasm of connective and other soft tissue, unspecified: Secondary | ICD-10-CM

## 2020-09-05 DIAGNOSIS — Z91041 Radiographic dye allergy status: Secondary | ICD-10-CM

## 2020-09-05 MED ORDER — METHYLPREDNISOLONE 32 MG PO TABS
ORAL_TABLET | 0 refills | Status: AC
Start: 2020-09-05 — End: ?

## 2020-09-05 NOTE — Progress Notes
Patient Consent to Telehealth Questionnaire   [facility] TELEHEALTH PRECHECKIN QUESTIONS 09/01/2020   By clicking ''I Agree'', I consent to the below:  I Agree     - I agree  to be treated via a video visit and acknowledge that I may be liable for any relevant copays or coinsurance depending on my insurance plan.  - I understand that this video visit is offered for my convenience and I am able to cancel and reschedule for an in-person appointment if I desire.  - I also acknowledge that sensitive medical information may be discussed during this video visit appointment and that it is my responsibility to locate myself in a location that ensures privacy to my own level of comfort.  - I also acknowledge that I should not be participating in a video visit in a way that could cause danger to myself or to those around me (such as driving or walking).  If my provider is concerned about my safety, I understand that they have the right to terminate the visit.         Gynecology Telehealth Progress Note    CC: follow-up for PMB    Subjective   No new complaints. Denies changes in medical/surgical/social/family hx since last visit on 08/16/20.    Objective   PE:  General: WDWN female in NAD  Neurologic/Psychiatric: A&O x 4, mood/affect appropriate    Labs:  06/29/20  CBC 6.09/13.1/43.7/263  Hgb A1c 6.7  TSH 2.2  ???  Imaging:  Pelvic U/S (08/06/20): Uterus 9.4 x 7.8 x 5.3 cm. Myometrium homogenous. Multiple fibroids, including 5.8 cm intramural/subserosal fibroid in R body and 3.7 cm subserosal/intramural fibroid in anterior fundus. EEC 3.1 mm. No focal endometrial lesions. B ovaries not seen. Free fluid.    Pathology:  Pap (08/16/20): NILM HPV neg  EMB (10/01/17; OSR obtained, scanned into [facility], and reviewed): mostly clotted blood with few benign endocervical glands and scant superficial endometrial lining with naked glands, no e/o atypia or malignancy, no endometrial stroma or deep endometrial glands present for complete endometrial assessment  EMB (05/26/19; OSR obtained, scanned into [facility], and reviewed): unremarkable weekly proliferative to inactive endometrial fragments; negative for inflammation, hyperplasia, or malignancy    Assessment & Plan   60 y.o. PMP [phone] with PMB and fibroids.  - Patient has experienced sporadic PMB since final ''normal'' menstrual period in 2018.  - OSR obtained, reviewed, and scanned into [facility]. Notable for benign EMB x 2 in 2019 and 2020, although first EMB with only scant tissue. Discussed that benign EMB x 2 is reassuring. Persistent PMB continues to be concerning, although likely due to fibroids.  - Discussed that, in response to patient's inquiry, gynecologic procedures cannot be performed at the time of her planned spinal surgery due to concern for infection. Per patient's neurosurgeon Dr. Lenna Gilford, gynecologic procedures (clean-contaminated) should not be performed within 4-6 weeks of spine instrumentation.  - Discussed options of expectant management, medical management, Interventional Radiology (IR) procedures, and surgical management with myomectomy versus hysterectomy. Discussed the benefits and risks of each option and written information provided on UFE and hysterectomy. Specifically discussed that patient's multiple medical comorbidities incur higher risks of general anesthesia, which is typically necessary with surgical management.  - Recommended medroxyprogesterone at this time and further evaluation with pelvic MRI. Patient agrees. Medroxyprogesterone 10 mg PO qday e-rx'd to patient's confirmed preferred pharmacy. Pelvic MRI ordered and scheduling instructions provided.  - Discussed return precautions.  - Patient verbalizes understanding of all of  the above. All questions were answered to her satisfaction.  - Follow-up after pelvic MRI to discuss results.    I personally spent a total of >40 minutes on the date of this encounter, including both face-to-face and non-face-to-face time used for preparing to see the patient, including review of tests; obtaining and/or reviewing patient history; performing a medically-appropriate examination and/or evaluation; counseling and educating the patient, family, and/or caregiver; ordering medications, tests, and/or procedures; referring to and/or communicating with other healthcare professionals; documenting clinical information in the electronic medical record; interpreting and communicating results to the patient, family, and/or caregiver; and coordinating care.    Rondo Spittler D. Anda Sobotta, MD  08/31/2020  2:00 PM

## 2020-09-06 ENCOUNTER — Ambulatory Visit: Payer: MEDICAID

## 2020-09-06 ENCOUNTER — Ambulatory Visit: Payer: BLUE CROSS/BLUE SHIELD

## 2020-09-06 ENCOUNTER — Telehealth: Payer: MEDICAID

## 2020-09-06 DIAGNOSIS — R42 Dizziness and giddiness: Secondary | ICD-10-CM

## 2020-09-06 DIAGNOSIS — R519 Acute nonintractable headache, unspecified headache type: Secondary | ICD-10-CM

## 2020-09-06 DIAGNOSIS — G549 Nerve root and plexus disorder, unspecified: Secondary | ICD-10-CM

## 2020-09-06 DIAGNOSIS — R4182 Altered mental status, unspecified: Secondary | ICD-10-CM

## 2020-09-06 NOTE — Telephone Encounter
Referred to Nurse Triage for further assessment. Please see alternate encounter.

## 2020-09-06 NOTE — Progress Notes
[facility], [place]  Department of Neurosurgery      Progress Note     Primary Care Physician: Clint Lipps, DO  Referring Practitioner: Bobbe Medico., MD  Attending Physician: Lenna Gilford MD    Chief Complaint: No chief complaint on file.    Date of Service: 09/06/2020    Patient Consent to Telehealth Questionnaire   [facility] TELEHEALTH PRECHECKIN QUESTIONS 09/01/2020   By clicking ''I Agree'', I consent to the below:  I Agree     - I agree  to be treated via a video visit and acknowledge that I may be liable for any relevant copays or coinsurance depending on my insurance plan.  - I understand that this video visit is offered for my convenience and I am able to cancel and reschedule for an in-person appointment if I desire.  - I also acknowledge that sensitive medical information may be discussed during this video visit appointment and that it is my responsibility to locate myself in a location that ensures privacy to my own level of comfort.  - I also acknowledge that I should not be participating in a video visit in a way that could cause danger to myself or to those around me (such as driving or walking).  If my provider is concerned about my safety, I understand that they have the right to terminate the visit.       Interim History:   Chelsea [name] is a 60 y.o.-year-old female, with a history of Abnormal cardiovascular stress test (06/21/2020), Diabetes mellitus (HCC/RAF) (02/20/2017), Hypertension, essential (06/21/2020), Hypothyroidism (06/21/2020), Myelopathy concurrent with and due to spinal stenosis of cervical region (HCC/RAF) (06/21/2020), OSA (obstructive sleep apnea) (06/21/2020),???and 20 years disability due to severe panic disorder,???who returns to the clinic with neck pain, imbalance, and tingling in the hands. The patient was last seen on 07/26/2020, at which time we recommended surgical decompression after clearance with PEPC, via ACDF at C4/5 and C5/ with potential corpectomy if unable to decompress the retrovertebro stenosis.    Today, the patient notes she is in the process of undergoing pre-operative clearance. The patient reports that her voice experiences instances of raspyness, for which she has to clear her throat. She denies any new neurological symptoms. In the interim, the patient has been wearing her neck brace, particularly over the past few days, as the relief she experienced from her injections with Dr. Vickki Hearing has been fading. Initially she had significant relief from the C7-T1 interlaminar ESI on 07/28/2020, that primarily wore off about 3 days ago after significant physical activity. She also reports the burning pain in her bilateral legs had resolved with the injection, but has now fully returned and is significantly interfering with her daily activities. She also notes she has not taken gabapentin since her injection, but she considered restarting gabapentin yesterday due to her pain.     Of note, the patient also reports over the past week she has been experiencing episodes of dizziness and lightheadedness, for which she wants her PCP to refer her to a neurologist. She also recalls that she hit her head in 08/2019, from which she had concussion-like symptoms until about September/October 2021.     07/26/2020:  The patient notes her symptoms have persisted. She continues to have pain in her RUE, with weakness in right deltoid and right bicep, as well as 4+ weakness in right grip. She reports new pain in left posterior neck and shoulder in a similar distribution to her right, ending  above the scapula. Chelsea [name] denies any weakness in her LUE, but continues to have difficulty with bilateral fine motor control. She also endorses numbness in her feet bilaterally. She consulted with Dr. Vickki Hearing and is scheduled for [facility] with him on 07/28/2020.  She denies any other neurological symptoms.    06/23/2020:  The patient reports that 1 year ago she was getting into a van when she hit her head and suffered significant compression. After this incident, she began experiencing severe pain in her neck and tingling in her hands. She describes the pain as primarily in the right side of her neck, trapezius, and superior aspect of the scapula. She describes the pain as achy, dull, cramping, and excrutiating in nature. Her pain is there all the time, limits her ability to sleep, and is illicited with hyperextension, such as sleeping on her stomach. While her pain was originally on the right side, she is now experiencing left-sided pain. She does have pain that occasionally shoots into her arms, right side occasionally, not left. When in the arm, her pain stays in the upper part around the deltoid and biceps, and does not shoot into her hands. She does endorse tingling in all 5 fingers of both hands, and she has a decreased ability to use her hands for fine motor movements.???Chelsea [name]???also endorses problems with balance. When all her symptoms began, she attempted physical therapy which did not provide notably benefit, and the therapist felt uncomfortable performing injections. She does obtain good relief from gabapentin in regards to the tingling in her hands, but it does not help with her neck pain.???Chelsea [name]???denies bladder/bowel issues. She does have trouble walking due to feeling imbalance, and had no symptoms prior to her incident.???  ???  Finally,???Chelsea [name]???is able to perform all activities of daily living without assistance.???She denies any other neurological symptoms.???  ???    Physical Exam:   No Vitals taken at this visit, Telemedicine encounter    All within appearance of video frame:  General: Well appearing, no acute distress  Psych: Alert and oriented to person, place and time  ROM: Symmetric and within normal limits.    Diagnostic Imaging:   MR Cervical:   IMPRESSION: There are multilevel degenerative changes throughout cervical spine as described, greatest at C5-C6, where there is is posterior disc protrusion resulting in severe central canal stenosis with indentation of ventral cord. There is mild patchy T2   hyperintense signal within the cord at this level which may represent edema or myelomalacia. There are also moderate bilateral foraminal stenosis at this level.    ???  XR Cervical:   IMPRESSION: Degenerative disease most prominent at C5-C6.  ???    Assessment/Plan:   Ellajean Kiehne is a 60 y.o.-year-old female who returns to the clinic with neck pain, imbalance, and tingling in the hands. Imaging demonstrates multilevel degeneration with cervical stenosis most severe at C5/6.    Plan:   We discussed the overall prognosis of cervical myelopathy and it's tendency to slowly deteriorate in step-wise fashion for many patients. We also discussed that it may remain stable without progression in a small number of patients. Additionally, we addressed the possibility that in a small percentage of patients myelopathic symptoms may progress quickly.     Furthermore, we discussed the importance and need for surgical intervention when there is neurological deterioration, such as gait disturbance, bowel/bladder dysfunction, and hand clumsiness.    We reviewed the potential risks of no continued observation including progression of myelopathy, neurologic deficit and worsening  function. We reviewed the surgical goals, which are to halt the progression of neurologic deficits and that return of neurologic function may not occur and is not guaranteed even with surgery.    Given these circumstances, and understanding the risks and benefits of surgical intervention via ACDF at C4/5 and C5/ with potential corpectomy if unable to decompress the retrovertebro stenosis. Intraoperative and postoperative risks and benefits were explained to the patient in great detail including infection, bleeding, epidural hematoma, failure to achieve the goal of the surgery, intraoperative or delayed CSF leak, nerve injury causing weakness, paralysis or neuropathy, dysphagia, dysphonia, blood vessel injury, malplaced hardware, migration of hardware, hardware failure, non-union and need for additional future surgery. Patient agreed to the discussed risks and would like to proceed with surgery.    At this time, I will refer the patient to [facility] for evaluation of her described lightheadedness and dizziness. The proper scripts and referrals were provided to the patient and explained in detail. I also advised the patient to continue with obtaining pre-operative clearance, including from her cardiologist.     We will contact the patient in the interim to schedule the discussed ACDF at C4/5 and C5/ with potential corpectomy if unable to decompress the retrovertebro stenosis. The patient expressed an understanding and agreement to the plan. We encouraged the patient to contact our clinic in the interim with any questions or concerns. All questions were answered.     40+ minutes were spent personally by me today on this encounter. This time included assisting/instructing patient on how to get on video call as well as pre-visit review of the chart, time spent during the visit, and  today???s time spent after the visit documenting and coordinating care. All questions were answered and Leyton Brownlee understood and was satisfied with this plan.     Marda Stalker, NP assisted in the evaluation of this patient, which was directly supervised by Dr. Lenna Gilford.    SCRIBE SIGNATURE(S):  I, Marcello Moores, have assisted , Doy Hutching., MD with the documentation for Juliana Boling on   09/06/2020 at 7:54 AM.    PHYSICIAN SIGNATURE(S):  Bobbe Medico., MD  09/06/2020 7:54 AM    I have reviewed this note, written by Marcello Moores and attest that it is an accurate representation of my H & P and other events of the outpatient visit except if otherwise noted.

## 2020-09-06 NOTE — Telephone Encounter
forwarding message to nurse triage for dizziness, lightheaded and mental confusion

## 2020-09-07 ENCOUNTER — Ambulatory Visit: Payer: BLUE CROSS/BLUE SHIELD

## 2020-09-07 ENCOUNTER — Inpatient Hospital Stay
Admit: 2020-09-07 | Discharge: 2020-09-08 | Disposition: A | Discharge status: Home / Self Care | Payer: MEDICARE | Source: Home / Self Care | Attending: Emergency Medical Services

## 2020-09-07 ENCOUNTER — Ambulatory Visit: Payer: MEDICAID

## 2020-09-07 ENCOUNTER — Ambulatory Visit: Payer: MEDICARE

## 2020-09-07 DIAGNOSIS — R410 Disorientation, unspecified: Secondary | ICD-10-CM

## 2020-09-07 LAB — Differential Automated: IMMATURE GRANULOCYTES%: 0.3 (ref 0.00–0.50)

## 2020-09-07 LAB — Comprehensive Metabolic Panel
ANION GAP: 10 mmol/L (ref 8–19)
CALCIUM: 9.6 mg/dL (ref 8.6–10.4)
CREATININE: 0.68 mg/dL (ref 0.60–1.30)

## 2020-09-07 LAB — Troponin I: TROPONIN I: <0.04 ng/mL (ref <0.04)

## 2020-09-07 LAB — UA,Dipstick: KETONES: NEGATIVE (ref 1.005–1.030)

## 2020-09-07 LAB — UA,Microscopic: RBCS: 1 {cells}/uL (ref 0–11)

## 2020-09-07 LAB — TSH with reflex FT4, FT3: TSH: 1.6 u[IU]/mL (ref 0.3–4.7)

## 2020-09-07 LAB — CBC: NUCLEATED RBC%, AUTOMATED: 0 (ref 36.9–48.3)

## 2020-09-07 NOTE — Progress Notes
[facility] Progress Note    CC:    Chief Complaint   Patient presents with   ??? Other     having dizziness, headaches, slight confusion, fatigue, per patient ''not feeling well''       HPI:    Chelsea [name] is a 60 y.o. female with an active medical problem list as outlined below presents to clinic for same day appointment.     Dizziness, light headedness, headache, confusion.  Going on this month, but worse last 3 days  Confusion worsening  She is forgetting directions, she has to wait to think about things that is new  No recent injury or trauma.    Hx of concussion. 08/2019  Balance problem as well.  Hx of spinal stenosis      ROS:  Denies fever/ vomiting/ chest pain/ dyspnea    OBJECTIVE  Physical Exam:    Vitals:    09/06/20 1803   BP: 127/79   BP Location: Left arm   Patient Position: Sitting   Cuff Size: Large   Pulse: 64   Resp: 18   Temp: 36.3 ???C (97.4 ???F)   TempSrc: Tympanic   SpO2: 98%   Weight: (!) 247 lb (112 kg)   Height: 5' 2'' (1.575 m)       CONST: No acute distress, speaking in full sentences, non toxic,  Head: atraumatic, normocephalic  EYES: sclera anicteric, normal conjunctiva/ eyelids  ENT: normal outer ears/nose, normal nasal mucosa/turbinates/septum, oral mucosa/ palate normal    RESP:  Normal effort, clear bil , no wheezes/rales/rhonchi  CARDIO:  Normal rate/ rhythm, no m/g/r. No extremity edema  SKIN: no rashes or nodules  Neuro: CN II-XII normal    EKG: NSR, no ST/T changes (tracing was reviewed independently and read by me in office)    ASSESSMENT AND PLAN    1. Dizziness  2. Altered mental status, unspecified altered mental status type  3. Acute nonintractable headache, unspecified headache type  - ECG 12-Lead Clinic Performed    Patient aware needs blood work, imaging and possible admission.  She stated she will go to ER today or tomorrow.  Discussed importance of going to ER today as I am worried about her brain and worsening of symptoms.  Advised she needs Brain Scan (CT/MRI) to rule out any life threatening conditions.    The above plan of care, diagnoses, orders, and follow-up were discussed with the patient. Questions related to this recommended plan of care were answered.    Follow up with PCP     [facility] M.D.

## 2020-09-07 NOTE — ED Provider Notes
[name]  Emergency Department Service Report    Triage     Chelsea [name], a 60 y.o. female, presents with Headache (Diziness, disorientation since Friday. Sent from urgent care for CT VS MRI)    Arrived on 09/07/2020 at 9:52 AM   Arrived by Walk-in [14]    ED Triage Vitals   Temp Temp Source BP Heart Rate Resp SpO2 O2 Device Pain Score Weight   09/07/20 1006 09/07/20 1215 09/07/20 1006 09/07/20 1006 09/07/20 1006 09/07/20 1006 09/07/20 1215 09/07/20 1006 09/07/20 1006   36.2 ???C (97.2 ???F) Temporal 122/55 64 16 98 % None (Room air) Five (!) 112 kg (247 lb)       Allergies   Allergen Reactions   ??? Iodinated Diagnostic Agents Anaphylaxis        Initial Physician Contact     Medical Screening Exam Initiated     Date/Time Event User Comments    09/07/20 1110 MSE Initiated Clotilde Dieter DAVID           Comprehensive Exam Initiated  Contact Date: 09/07/20  Contact Time: 1124    History   HPI  Chelsea [name] is a 61 y.o. female with past medical history of Abnormal cardiovascular stress test (06/21/2020), Diabetes mellitus (HCC/RAF) (02/20/2017), Hypertension, essential (06/21/2020), Hypothyroidism (06/21/2020), Myelopathy concurrent with and due to spinal stenosis of cervical region (HCC/RAF) (06/21/2020), OSA (obstructive sleep apnea), (06/21/2020),???presenting with 4 days of intermittent disorientation that is worsening since onset. Patient has a history of concussion from a year ago. She reports feeling an increase of disorentation since friday  stating she is now feeling weak and wobbly with ambulation. Additionally she reportedly had IM steroids for pain management of neck. Yesterday she began feeling numbness return to palms of bilateral hands that are consistent with previous neck pain/hand numbness prior to IM steroids. At this time she is endorsing constant headache more prominent on the left side of her head but radiating to the right side. She is denies chest pain, abdominal pain, leg pain, fever, weakness or numbness on upper/lower extremities, vomiting, nausea, diarrhea, smoking, EtOH.     Past Medical History:   Diagnosis Date   ??? Abnormal cardiovascular stress test 06/21/2020    06/21/2020 Preop exam showed abnormal stress test with no EKG e/o ischemia but e/o large anterior wall ischemia with recommendation for cardiac catheterization. Had second opinion with repeat exercise nuclear stress test showing no e/o ischemia and was recommended against cardiac catheterization. I recommended starting Atorvastatin 40 mg every day for her DM2, but patient wishes to repeat labs fir   ??? Diabetes mellitus (HCC/RAF) 02/20/2017    06/21/2020 Chronic, stable. Last HgA1c 6.6% on 06/03/19. C/w MFM 500 mg BID. Repeat HgA1c. Recommended starting Atorvastatin 40 mg every day but patient wishes to repeat labs first. Will need to clarify last retinopathy screening at next visit.    ??? Hypertension, essential 06/21/2020    06/21/2020 Chronic,s table. C/w Benicar 40/25 mg x1/2 tablet every day. Reassess at next in office appointment.    ??? Hypothyroidism 06/21/2020   ??? Myelopathy concurrent with and due to spinal stenosis of cervical region (HCC/RAF) 06/21/2020    06/21/2020 Radiculopathy and myelopathy symptoms since forehead injury 08/25/19. MRI cervical spine 09/30/19 showing multilevel degenerative changes and central disk/osteophyte protrusion impinging the spinal cord with severe spinal stenosis and severe BL foraminal stenosis. Originally recommended for surgical intervention, which was then cancelled due to abnormal preoperative stress test. Neurosurge   ??? OSA (obstructive  sleep apnea) 06/21/2020    06/21/2020 Chronic, stable. C/w CPAP at bedtime.         Past Surgical History:   Procedure Laterality Date   ??? CESAREAN SECTION      x2   ??? CHOLECYSTECTOMY  1987   ??? INCISIONAL HERNIA REPAIR  1987        Past Family History   family history is not on file.     Past Social History   she reports that she has quit smoking. Her smoking use included cigarettes. She has a 1.25 pack-year smoking history. She has never used smokeless tobacco. She reports previous alcohol use. She reports previous drug use. No history on file for sexual activity.     Review of Systems   Constitutional: Negative.    Cardiovascular: Negative.    Neurological:        Disoriented  Numbness of hand palms   All other systems reviewed and are negative.      Physical Exam   Physical Exam  Constitutional: Obese. Appears well-developed and well-nourished. No distress.   HENT:   Head: Normocephalic and atraumatic.   Eyes: Pupils are equal, round, and reactive to light.   Neck: Normal range of motion. Neck supple, Non-tender  Cardiovascular: Normal rate, regular rhythm, normal heart sounds and intact distal pulses.  Exam reveals no gallop.  No murmur heard.  Pulmonary/Chest: Effort normal and breath sounds normal. No respiratory distress. No wheezes. No rales.   Abdominal: Soft. Bowel sounds are normal. No distension. There is no tenderness. There is no rebound and no guarding.   Musculoskeletal: Normal range of motion, No lower extremity edema.  Neurological: Alert and Oriented to self, place and date except she was off by 1 day on date. Moving all extremities spontaneously. 5/5 strength on all extremities. Sensation intact although reports numbness of palms of hands.   Skin: Skin is warm and dry. No rash noted.   Psychiatric: Normal mood and affect. Behavior is normal. Judgment and thought content normal.       Medical Decision Making   Chelsea [name] is a 61 y.o. female with past medical history of Abnormal cardiovascular stress test (06/21/2020), Diabetes mellitus (HCC/RAF) (02/20/2017), Hypertension, essential (06/21/2020), Hypothyroidism (06/21/2020), Myelopathy concurrent with and due to spinal stenosis of cervical region (HCC/RAF) (06/21/2020), OSA (obstructive sleep apnea) (06/21/2020),???presenting with 4 days of intermittent disorientation that is worsening since onset. Patient presenting with neurological symptoms. Neuro exam is reassuring, however, suspicion of possible metabolic encephalopathy. Plan to order labs and MRI to rule out stroke. May need to consult neuro.     Chart Review   Previous medical records requested.  Pertinent items reviewed: PMH    ED Course      Laboratory Results     Labs Reviewed   CBC (PERFORMABLE) - Abnormal; Notable for the following components:       Result Value    MCH Concentration 31.3 (*)     All other components within normal limits   UA,MICROSCOPIC - Abnormal; Notable for the following components:    Bacteria Present (*)     Squamous Epi Cells 48 (*)     All other components within normal limits   TSH WITH REFLEX FT4, FT3 - Normal   TROPONIN I - Normal   UA,DIPSTICK - Normal   CBC & AUTO DIFFERENTIAL    Narrative:     The following orders were created for panel order CBC with differential.  Procedure  Abnormality         Status                     ---------                               -----------         ------                     ZOX[[id number]]                          Abnormal            Final result               Differential, Automated[[id number]]                          Final result                 Please view results for these tests on the individual orders.   COMPREHENSIVE METABOLIC PANEL   DIFFERENTIAL, AUTOMATED (PERFORMABLE)   URINALYSIS W/REFLEX TO CULTURE    Narrative:     The following orders were created for panel order Urinalysis w/Reflex to Culture (ED Abdominal Pain/Nausea/Vomiting: Nurse Protocol).  Procedure                               Abnormality         Status                     ---------                               -----------         ------                     UA,Dipstick[[id number]]                  Normal              Final result               UA,Microscopic[[id number]]               Abnormal            Final result                 Please view results for these tests on the individual orders. Imaging Results     MR brain angiogram wo contrast   Final Result by Arvilla Market., MD, PhD (03/22 1859)   IMPRESSION:       MRI BRAIN:    1.  No acute infarct or intracranial hemorrhage.   2.  Nonspecific FLAIR hyperintensity within the posterior insular cortex, which may represent evolving subacute to chronic infarct.  Consider 1 month follow-up to assess temporal change.      MRA BRAIN: No proximal large vessel occlusion or high grade stenosis.      MRA NECK: No occlusion or high grade stenosis.       I, Megan Mans, MD, PhD have reviewed this study and agree with the above report.       Dictated by: Casimiro Needle  Destony Prevost   09/07/2020 6:46 PM      Signed by: Megan Mans   09/07/2020 6:59 PM      MR brain wo contrast   Final Result by Arvilla Market., MD, PhD (03/22 1859)   IMPRESSION:       MRI BRAIN:    1.  No acute infarct or intracranial hemorrhage.   2.  Nonspecific FLAIR hyperintensity within the posterior insular cortex, which may represent evolving subacute to chronic infarct.  Consider 1 month follow-up to assess temporal change.      MRA BRAIN: No proximal large vessel occlusion or high grade stenosis.      MRA NECK: No occlusion or high grade stenosis.       I, Megan Mans, MD, PhD have reviewed this study and agree with the above report.       Dictated by: Eleanora Neighbor   09/07/2020 6:46 PM      Signed by: Megan Mans   09/07/2020 6:59 PM      MR neck angiogram wo contrast   Final Result by Arvilla Market., MD, PhD (03/22 1859)   IMPRESSION:       MRI BRAIN:    1.  No acute infarct or intracranial hemorrhage.   2.  Nonspecific FLAIR hyperintensity within the posterior insular cortex, which may represent evolving subacute to chronic infarct.  Consider 1 month follow-up to assess temporal change.      MRA BRAIN: No proximal large vessel occlusion or high grade stenosis.      MRA NECK: No occlusion or high grade stenosis.       I, Megan Mans, MD, PhD have reviewed this study and agree with the above report.       Dictated by: Eleanora Neighbor   09/07/2020 6:46 PM      Signed by: Megan Mans   09/07/2020 6:59 PM          Consults       Time               Service    Progress Notes / Reassessments     ED Course as of 09/19/20 2307   Tue Sep 07, 2020   1700 Patient allergic to contrast. Will get MRI w/o contrast   [JL]   1911 MRI shows:      MRI BRAIN:   1.  No acute infarct or intracranial hemorrhage.  2.  Nonspecific FLAIR hyperintensity within the posterior insular cortex, which may represent evolving subacute to chronic infarct.  Consider 1 month follow-up to assess temporal change.     [JL]   1911 Updated patient, advised to f/u.  [JL]   1911 UA with bacteria, nothing else suggesting infection. Large amount of squamous cells.  [JL]   1926 Had extensive conversation with patient answering her questions. Encouraged follow up with primary care physician in the next day or two. Urged her to take prescriptions as directed.  [BM]   1934 Walked patient. Normal gait. No lateralizing movements. No cane use. Negative Rhomberg. Encouraged f/u with PCP. Put findings in discharge paperwork. Many questions answered. Encouraged rest and hydration. Doubt this is a central issue. Will give additional tylenol for headache. Safe for discharge, looks well and has follow up. Patient feels comfortable with discharge.  [JL]      ED Course User Index  [BM] Mengistu, Beza Mulatu  [JL] Ellamae Sia., MD       Time  Notes  ED Procedure Notes     Any ED procedures performed are documented on separate ED procedure notes.    Clinical Impression     1. Disorientation        Disposition and Follow-up   Disposition: Discharge [1]       Future Appointments   Date Time Provider Department Center   09/20/2020 10:45 AM Clint Lipps, DO [facility] MEDICINE   09/30/2020  4:00 PM MP2 MAM 02-RADIOLOGY MAM MP2 [facility] [facility]   10/12/2020  1:00 PM BIC MR01 3T MRI BIC SM Radiology   10/12/2020  3:00 PM BIC [place] 01-RADIOLOGY [place] BIC [facility] Radiology   10/25/2020  8:30 AM Mayo, Pincus Sanes., MD NEUSMPROV SM NEUROLOGY   11/05/2020  2:30 PM Clide Deutscher, PT WW PT [facility] SERVIC   11/09/2020 11:30 AM Nelida Meuse, Boyd Kerbs, PT WW PT [facility] SERVIC   11/16/2020 11:15 AM Clide Deutscher, PT WW PT [facility] SERVIC   11/18/2020 10:45 AM Donell Sievert, MD JS [facility] OPH   11/23/2020 12:30 PM Clide Deutscher, PT WW PT [facility] SERVIC   11/30/2020 10:30 AM Clide Deutscher, PT WW PT [facility] SERVIC   12/07/2020 10:30 AM Nelida Meuse, Boyd Kerbs, PT WW PT [facility] SERVIC       Follow up with:  No follow-up provider specified.    Return precautions are specified on After Visit Summary.    Discharge Medication List as of 09/07/2020  7:36 PM          Orders Placed This Encounter   ??? MR brain wo contrast   ??? MR neck angiogram wo contrast   ??? MR brain angiogram wo contrast   ??? CBC with differential   ??? TSH with reflex FT4, FT3   ??? RPR   ??? Comprehensive Metabolic Panel   ??? Troponin   ??? CBC   ??? Differential, Automated   ??? Urinalysis w/Reflex to Culture (ED Abdominal Pain/Nausea/Vomiting: Nurse Protocol)   ??? UA,Dipstick   ??? UA,Microscopic   ??? ECG, 12 lead   ??? ED 12 LEAD ECG    ??? acetaminophen tab 500 mg   ??? acetaminophen tab 500 mg   ??? DISCONTD: ibuprofen tab 600 mg       Scribe Signature   I, Leeroy Bock, have acted as a Stage manager for [employer] on behalf of Dr. Alphonzo Grieve on 09/07/2020 at 12:10 PM.    Physician Signature(s)   I have reviewed this note as recorded by MR, who acted as medical scribe, and I attest that it is an accurate representation of my H&P and other events of the ED visit except as otherwise noted.        Scribe Signature   I, [employer], have acted as a Stage manager for [employer] on behalf of Dr. Noland Fordyce on 09/07/2020 at 7:28 PM.    Physician Signature(s)     I have reviewed this note written by Debria Garret, and attest that it is an accurate representation of my H&P and other events of the ED visit.            Ellamae Sia., MD  09/09/20 0141 Alphonzo Grieve, Pearletha Furl., MD  09/19/20 2308

## 2020-09-07 NOTE — ED Notes
COLLECTIVE?NOTIFICATION?09/07/2020 09:52?Aries, Jewell?MRN: [id number]    Criteria Met      CURES    Security and Safety  No recent Security Events currently on file    ED Care Guidelines  There are currently no ED Care Guidelines for this patient. Please check your facility's medical records system.        Prescription Drug Report (12 Mo.)  Rx Details  Fill Date Drug Description Qty. Prescriber   2020-02-27 TRAMADOL HCL 20    2020-01-06 HYDROCODONE BITARTRATE-ACETAMINOPHE 20    2019-12-22 HYDROCODONE BITARTRATE-ACETAMINOPHE 10    2019-11-27 CODEINE-GUAIFENESIN 240    2019-10-22 HYDROCODONE BITARTRATE-ACETAMINOPHE 21      Rx Summary  Metric Count   Quantity Dispensed 311   Unique Prescribers 1   Unique Pharmacies 1         E.D. Visit Count (12 mo.)  Facility Visits   [name] 1   Total 1   Note: Visits indicate total known visits.     Recent Emergency Department Visit Summary  Date Facility [facility] Type Diagnoses or Chief Complaint   Sep 07, 2020 [facility]ase[facility] [place] [facility]         Recent Inpatient Visit Summary  No recorded inpatient visits.     Care Team  [facility] Special[facility] Phone Fax Service Dates   [place], [place] A MD Lajean Manes, MD Internal Medicine: Endocrinology, Diabetes and Metabolism [phone] Current      Collective Portal  This patient has registered at the [facility] Emergency Department   For more information visit: [email or web address]   PLEASE NOTE:     1.   Any care recommendations and other clinical information are provided as guidelines or for historical purposes only, and providers should exercise their own clinical judgment when providing care.    2.   You may only use this information for purposes of treatment, payment or health care operations activities, and subject to the limitations of applicable Collective Policies.    3.   You should consult directly with the organization that provided a care guideline or other clinical history with any questions about additional information or accuracy or completeness of information provided.    ? 2022 [place] - [email or web address]

## 2020-09-07 NOTE — Patient Instructions
Recommend CT vs MRI Brain for better evaluation.    Recommend to go to ER today.

## 2020-09-08 LAB — RPR: RPR: NONREACTIVE

## 2020-09-08 MED ORDER — ATORVASTATIN CALCIUM 40 MG PO TABS
40 mg | ORAL_TABLET | Freq: Every day | ORAL | 1 refills | Status: AC
Start: 2020-09-08 — End: 2020-09-23

## 2020-09-08 MED ADMIN — ACETAMINOPHEN 500 MG PO TABS: 500 mg | ORAL | @ 03:00:00 | Stop: 2020-09-08 | NDC 00904673061

## 2020-09-08 MED ADMIN — ACETAMINOPHEN 500 MG PO TABS: 500 mg | ORAL | @ 02:00:00 | Stop: 2020-09-08 | NDC 00904673061

## 2020-09-08 NOTE — ED Notes
Report given to Vance RN.

## 2020-09-08 NOTE — ED Notes
Introduced myself to patient. Checked armband for accuracy.Both Side rails up, brakes on gurney, in hallway at this time. Needs discussed. Privacy acknowledged. Delays explained. Made aware of care plan. No complications noted at this time.

## 2020-09-08 NOTE — Addendum Note
Addended by: Chana Bode on: 09/08/2020 10:30 AM     Modules accepted: Orders

## 2020-09-09 ENCOUNTER — Inpatient Hospital Stay
Admit: 2020-09-09 | Discharge: 2020-09-09 | Disposition: A | Discharge status: Home / Self Care | Payer: BLUE CROSS/BLUE SHIELD | Source: Home / Self Care

## 2020-09-09 ENCOUNTER — Telehealth: Payer: MEDICAID

## 2020-09-09 ENCOUNTER — Ambulatory Visit: Payer: BLUE CROSS/BLUE SHIELD

## 2020-09-09 ENCOUNTER — Ambulatory Visit: Payer: MEDICAID

## 2020-09-09 DIAGNOSIS — R9089 Other abnormal findings on diagnostic imaging of central nervous system: Secondary | ICD-10-CM

## 2020-09-09 DIAGNOSIS — I10 Essential (primary) hypertension: Secondary | ICD-10-CM

## 2020-09-09 DIAGNOSIS — E890 Postprocedural hypothyroidism: Secondary | ICD-10-CM

## 2020-09-09 DIAGNOSIS — R519 Nonintractable headache, unspecified chronicity pattern, unspecified headache type: Secondary | ICD-10-CM

## 2020-09-09 MED ORDER — LEVOTHYROXINE SODIUM 100 MCG PO TABS
ORAL_TABLET | 1 refills | Status: AC
Start: 2020-09-09 — End: ?

## 2020-09-09 MED ORDER — OLMESARTAN MEDOXOMIL-HCTZ 40-25 MG PO TABS
.5 | ORAL_TABLET | Freq: Every day | ORAL | 1 refills | Status: AC
Start: 2020-09-09 — End: ?

## 2020-09-09 MED ORDER — ASPIRIN EC 81 MG PO TBEC
81 mg | ORAL_TABLET | Freq: Every day | ORAL | 0 refills | Status: AC
Start: 2020-09-09 — End: ?

## 2020-09-09 NOTE — ED Notes
Pt seen yesterday for headache and numbness, MRI series done. Pt having similar symptoms.

## 2020-09-09 NOTE — ED Notes
MD at bedside speaking to pt.

## 2020-09-09 NOTE — ED Notes
COLLECTIVE?NOTIFICATION?09/08/2020 21:53?Eblin, Bristyn?MRN: [id number]    Criteria Met      2 Visits in 30 Days    CURES    Security and Safety  No recent Security Events currently on file    ED Care Guidelines  There are currently no ED Care Guidelines for this patient. Please check your facility's medical records system.        Prescription Drug Report (12 Mo.)  Rx Details  Fill Date Drug Description Qty. Prescriber   2020-02-27 TRAMADOL HCL 20    2020-01-06 HYDROCODONE BITARTRATE-ACETAMINOPHE 20    2019-12-22 HYDROCODONE BITARTRATE-ACETAMINOPHE 10    2019-11-27 CODEINE-GUAIFENESIN 240    2019-10-22 HYDROCODONE BITARTRATE-ACETAMINOPHE 21      Rx Summary  Metric Count   Quantity Dispensed 311   Unique Prescribers 1   Unique Pharmacies 1         E.D. Visit Count (12 mo.)  Facility Visits   Lashell Moffitt [name] 2   Total 2   Note: Visits indicate total known visits.     Recent Emergency Department Visit Summary  Date Facility [facility] Type Diagnoses or Chief Complaint   Sep 08, 2020 Lorayne Getchell [facility] [place] [facility]     Sep 07, 2020 [facility]      1. Disorientation, unspecified      2. Headache          Recent Inpatient Visit Summary  No recorded inpatient visits.     Care Team  Athalie [facility] Specialty Phone Fax Service Dates   [place], [place] A MD Lajean Manes, MD Internal Medicine: Endocrinology, Diabetes and Metabolism [phone] Current      Collective Portal  This patient has registered at the [name] Emergency Department   For more information visit: [email or web address]   PLEASE NOTE:     1.   Any care recommendations and other clinical information are provided as guidelines or for historical purposes only, and providers should exercise their own clinical judgment when providing care.    2.   You may only use this information for purposes of treatment, payment or health care operations activities, and subject to the limitations of applicable Collective Policies.    3.   You should consult directly with the organization that provided a care guideline or other clinical history with any questions about additional information or accuracy or completeness of information provided.    ? 2022 [place] - [email or web address]

## 2020-09-09 NOTE — ED Provider Notes
[name]  Emergency Department Service Report    Triage     Chelsea [name], a 60 y.o. female, presents with Numbness (Plus headache to left side of head and face, seen here and MRI series done yesterday. Worsening symptoms this evening. )    Arrived on 09/08/2020 at 9:53 PM   Arrived by Walk-in [14]    ED Triage Vitals   Temp Temp Source BP Heart Rate Resp SpO2 O2 Device Pain Score Weight   09/08/20 2202 09/08/20 2202 09/08/20 2202 09/08/20 2202 09/08/20 2202 09/08/20 2202 09/08/20 2202 -- 09/08/20 2157   36.3 ???C (97.4 ???F) Temporal 141/65 62 18 100 % None (Room air)  (!) 111.1 kg (245 lb)       Allergies   Allergen Reactions   ??? Iodinated Diagnostic Agents Anaphylaxis        Initial Physician Contact       Comprehensive Exam Initiated  Contact Date: 09/09/20  Contact Time: 0233    History   HPI    60 y.o. female with h/o Abnormal cardiovascular stress test (06/21/2020), Diabetes mellitus (02/20/2017), HTN (06/21/2020), Hypothyroidism (06/21/2020), Myelopathy concurrent with and due to spinal stenosis of cervical region (06/21/2020), and OSA (06/21/2020) p/w intermittent, worsening left facial numbness and headache since yesterday.    - Patient reports that she presented to the [facility] ED two days ago for similar issues, and is returning today for exacerbation of her symptoms.     - She states that the numbness sometimes radiates to the top of her head and the right side, but mostly localizes to the left side, adding that it sometimes feels cold as well.    - Patient also reports starting a new diet within the past several weeks for diabetes management, with notable weight loss of 10 lbs. within the past ten days.    - Of note, patient states that she is not tracking her blood pressure at home but has otherwise been compliant with all of her medications. She also states that she has an upcoming follow-up with her doctor tomorrow.    - Patient denies any fever, chills, cough, shortness of breath, chest pain, leg swelling, abdominal pain, nausea, vomiting, urinary changes, or any other symptoms.      Per chart review, from 09/07/2020 [facility] ED encounter:    Final Result by Arvilla Market., MD, PhD (03/22 1859)   IMPRESSION:        MRI BRAIN:    1.  No acute infarct or intracranial hemorrhage.   2.  Nonspecific FLAIR hyperintensity within the posterior insular cortex, which may represent evolving subacute to chronic infarct.    MRA BRAIN: No proximal large vessel occlusion or high grade stenosis.       MRA NECK: No occlusion or high grade stenosis.              Past Medical History:   Diagnosis Date   ??? Abnormal cardiovascular stress test 06/21/2020    06/21/2020 Preop exam showed abnormal stress test with no EKG e/o ischemia but e/o large anterior wall ischemia with recommendation for cardiac catheterization. Had second opinion with repeat exercise nuclear stress test showing no e/o ischemia and was recommended against cardiac catheterization. I recommended starting Atorvastatin 40 mg every day for her DM2, but patient wishes to repeat labs fir   ??? Diabetes mellitus (HCC/RAF) 02/20/2017    06/21/2020 Chronic, stable. Last HgA1c 6.6% on 06/03/19. C/w MFM 500 mg BID. Repeat HgA1c. Recommended starting  Atorvastatin 40 mg every day but patient wishes to repeat labs first. Will need to clarify last retinopathy screening at next visit.    ??? Hypertension, essential 06/21/2020    06/21/2020 Chronic,s table. C/w Benicar 40/25 mg x1/2 tablet every day. Reassess at next in office appointment.    ??? Hypothyroidism 06/21/2020   ??? Myelopathy concurrent with and due to spinal stenosis of cervical region (HCC/RAF) 06/21/2020    06/21/2020 Radiculopathy and myelopathy symptoms since forehead injury 08/25/19. MRI cervical spine 09/30/19 showing multilevel degenerative changes and central disk/osteophyte protrusion impinging the spinal cord with severe spinal stenosis and severe BL foraminal stenosis. Originally recommended for surgical intervention, which was then cancelled due to abnormal preoperative stress test. Neurosurge   ??? OSA (obstructive sleep apnea) 06/21/2020    06/21/2020 Chronic, stable. C/w CPAP at bedtime.         Past Surgical History:   Procedure Laterality Date   ??? CESAREAN SECTION      x2   ??? CHOLECYSTECTOMY  1987   ??? INCISIONAL HERNIA REPAIR  1987        Past Family History   family history is not on file.     Past Social History   she reports that she has quit smoking. Her smoking use included cigarettes. She has a 1.25 pack-year smoking history. She has never used smokeless tobacco. She reports previous alcohol use. She reports previous drug use. No history on file for sexual activity.     Review of Systems   Constitutional: Negative for chills and fever.   Respiratory: Negative for cough and shortness of breath.    Cardiovascular: Negative for chest pain and leg swelling.   Gastrointestinal: Negative for abdominal pain, nausea and vomiting.   Genitourinary: Negative.    Neurological: Positive for numbness (left-sided facial) and headaches.   All other systems reviewed and are negative.      Physical Exam   Physical Exam  Vitals and nursing note reviewed.   Constitutional:       General: She is not in acute distress.     Appearance: Normal appearance. She is not ill-appearing.   HENT:      Head: Normocephalic and atraumatic.      Nose: Nose normal.   Eyes:      Extraocular Movements: Extraocular movements intact.      Conjunctiva/sclera: Conjunctivae normal.   Cardiovascular:      Rate and Rhythm: Normal rate.   Pulmonary:      Effort: Pulmonary effort is normal. No respiratory distress.   Abdominal:      General: There is no distension.   Musculoskeletal:         General: No swelling. Normal range of motion.      Cervical back: Normal range of motion and neck supple.   Skin:     General: Skin is warm and dry.   Neurological:      Mental Status: She is alert and oriented to person, place, and time.   Psychiatric:         Mood and Affect: Mood normal. Behavior: Behavior normal.           Medical Decision Making   Chelsea [name] is a 60 y.o. female with h/o Abnormal cardiovascular stress test (06/21/2020), Diabetes mellitus (02/20/2017), HTN (06/21/2020), Hypothyroidism (06/21/2020), Myelopathy concurrent with and due to spinal stenosis of cervical region (06/21/2020), and OSA (06/21/2020) p/w intermittent, worsening left facial numbness and headache for the past several days.***  Chart Review   Previous medical records requested.  Pertinent items reviewed.    ED Course      Laboratory Results   Labs Reviewed - No data to display    Imaging Results     No orders to display       Consults       Progress Notes / Reassessments     ED Course as of 09/09/20 0312   Thu Sep 09, 2020   0300 Spoke at length with patient regarding the diagnostic merits of the MR imaging she had performed two days ago, and provided reassurance and guidance on blood pressure tracking [BD]      ED Course User Index  [BD] Chrissie Noa       ED Procedure Notes     Any ED procedures performed are documented on separate ED procedure notes.    Clinical Impression   No diagnosis found.    Disposition and Follow-up   Disposition: Refresh note to pull in Disposition       Future Appointments   Date Time Provider Department Center   09/09/2020  9:45 AM Clint Lipps, DO [facility] [id number] MEDICINE   09/13/2020  1:30 PM SM PULM, PFT [id number] MEDICINE   09/30/2020 10:15 AM Clint Lipps, DO [facility] PC[facility] MEDICINE   09/30/2020  4:00 PM MP2 MAM 02-RADIOLOGY MAM MP2 [place] [facility]   10/12/2020  1:00 PM BIC MR01 3T MRI BIC SM Radiology   10/12/2020  3:00 PM BIC [place] 01-RADIOLOGY [place] BIC [facility] Radiology   11/18/2020 10:45 AM Donell Sievert, MD JS [facility] OPH       Follow up with:  No follow-up provider specified.    Return precautions are specified on After Visit Summary.    New Prescriptions    No medications on file       No orders of the defined types were placed in this encounter.    Scribe Signature   I, Danley Danker, have acted as a Stage manager for [employer] on behalf of Dr. Brent Bulla on 09/09/2020 at 2:46 AM.    Physician Signature(s)   *** Please add [email or web address] and BD. Thank you!

## 2020-09-09 NOTE — Progress Notes
PATIENT: Chelsea [name]  MRN: [id number]  DOB: [birth year]  DATE OF SERVICE: 09/09/2020    CHIEF COMPLAINT:   Chief Complaint   Patient presents with   ??? Follow-up     ER        HPI   Chelsea [name] is a 60 y.o. female who  has a past medical history of Abnormal cardiovascular stress test (06/21/2020), Diabetes mellitus (HCC/RAF) (02/20/2017), Hypertension, essential (06/21/2020), Hypothyroidism (06/21/2020), Myelopathy concurrent with and due to spinal stenosis of cervical region (HCC/RAF) (06/21/2020), and OSA (obstructive sleep apnea) (06/21/2020). who presents for   Chief Complaint   Patient presents with   ??? Follow-up     ER     Recurrent severe headaches  Possible evolving subacute to chronic infarct of the brain  Hypercholesterolemia with elevated ASCVD risk  Patient presents with acute intermittent disorientation with severe headaches since presumably 09/06/20.  She reports forgetting her birthday month.  She does have chronic history of neck pain, headaches, imbalance, and peripheral neuropathy due to her cervical spine disease, further below.    Also +history of concussions with forehead trauma.  She feels her forehead still looks very different to her.  She was seen at urgent care on the same day, and has since had two [facility] visits on 09/07/20 and earlier today 09/09/20.  MR brain shows possible evolving subacute to chronic infarct within the posterior insular cortex.  She is already started on atorvastatin.  She plans to repeat imaging in 1 month as advised   Today, she reports headaches have stopped.  Dizziness improved with sitting down and rest.  She feels it is possible from doing too much but she is very overwhelmed.  She has to sleep upright due to neck pain    History of cervical myeloradiculopathy  Onset since forehead injury 08/25/19, with evidence of cervical spinal stenosis on imaging.  Her symptoms included severe neck pain, imbalance, headaches, and tingling in the hands.  Addressed thoroughly with Dr. Vickki Hearing.    S/p C7-T1 interlaminar ESI on 07/28/20 with initial relief but tapered over course of a month.  Imaging demonstrates multilevel degeneration with cervical stenosis most severe at C5/6.    She discussed with NSGY Dr. Konrad Felix regarding possible anterior cervical discectomy and fusion (ACDF) at C4/5 with potential corpectomy in unable to decompress the retrovertebral stenosis.  Surgical date not set yet.   On gabapentin  Followed by PMR Dr. Vickki Hearing and NSGY Dr. Konrad Felix     Anxiety  Very anxious esp in s/o her chronic neck pain, headaches, and confusion  History of 20y disability due to severe panic disorder     #Obesity  She reports interval weight loss of 9 pounds - today's weight is 243 lbs  In weight loss program    She also needs a referral for walker.      OTHER STABLE CHRONIC CONDITIONS BELOW    #HTN  Chronic, stable.???Benicar 40/25 mg x1/2 tablet every day  ???  #DM2  Diagnosed with HgA1c 9%.   Negative UACR.???  Initially was on metformin but stopped after weight loss.  Hgb A1c - HPLC (%)   Date Value   06/29/2020 6.7 (H)   ???  #Abnormal Cardiac Stress Test  Had abnormal stress as part of preop exam with cardiology showing no ischemic EKG changes but e/o large anterior wall ischemia. Recommended for angiogram and starting ASA 81 mg every day. She was seen by a second, outside cardiologist who repeated an exercise nuclear stress test  with no evidence of ischemia and recommended stopping aspirin and no need for angiogram.   Denies CP, SOB, orthopnea. Has mild BL LE edema.   Repeat [place] NM Treadmill Stress test completed on 07/23/20 with no e/o inducible ischemia.   ???  #OSA  Remains on CPAP at bedtime    #Hypothyroidism  S/p RAI in 30's.    Patient Care Team:  Clint Lipps, DO as PCP - General (Family Medicine)  Clint Lipps, DO as PCP - Plan Assigned (Family Medicine)    MEDS     Outpatient Medications Prior to Visit   Medication Sig   ??? atorvastatin 40 mg tablet Take 1 tablet (40 mg total) by mouth daily.   ??? ciclopirox 8% solution Apply topically at bedtime Apply to adjacent skin and affected nails daily. Remove with alcohol every 7 days. Use up to 48 weeks.Marland Kitchen   ??? clobetasol 0.05% ointment Apply topically two (2) times daily APPLY AND GENTLY MASSAGE INTO AFFECTED AREA(S) TWICE DAILY.   ??? clobetasol 0.05% ointment Apply topically two (2) times daily APPLY AND GENTLY MASSAGE INTO AFFECTED AREA(S) TWICE DAILY. Do not apply to face, armpit, or groin.Marland Kitchen   ??? diclofenac Sodium 1% gel Apply 4 g topically .   ??? diclofenac Sodium 1% gel apply 2 to 3 gram twice a day if needed   ??? gabapentin 100 mg capsule .   ??? gabapentin 300 mg capsule take 1 capsule by mouth five times a day   ??? hydrocortisone 1% cream Apply topically.   ??? medroxyPROGESTERone 10 mg tablet Take 1 tablet (10 mg total) by mouth daily.   ??? metoprolol succinate 25 mg 24 hr tablet take 1/2 tablet by mouth once daily   ??? levothyroxine 100 mcg tablet take 1 tablet by mouth every morning before breakfast   ??? olmesartan-hydroCHLOROthiazide 40-25 mg tablet Take 0.5 tablets by mouth daily.   ??? methylPREDNISolone 32 mg tablet 1 tab PO 12 hours before MRI and 1 tab PO 2 hours before MRI. (Patient not taking: Reported on 09/06/2020.)     No facility-administered medications prior to visit.       PHYSICAL EXAM      Last Recorded Vital Signs:     Body mass index is 44.81 kg/m???.  Wt Readings from Last 3 Encounters:   09/09/20 (!) 245 lb (111.1 kg)   09/08/20 (!) 245 lb (111.1 kg)   09/07/20 (!) 247 lb (112 kg)      Gen: well appearing, NAD  Head: at/nc  Psych: normal affect and eye contact  Neuro: alert and oriented x 3, no focal deficits  Skin: no bruising or rash    LABS/STUDIES   I have:   []  Reviewed/ordered []  1 []  2 []  ? 3 unique laboratory, radiology, and/or diagnostic tests noted below    []  Reviewed []  1 []  2 []  ? 3 prior external notes and incorporated into patient assessment    []  Discussed management or test interpretation with external provider(s) as noted       Lab Studies:  CBC: Results for orders placed or performed during the hospital encounter of 09/07/20   CBC   Result Value Ref Range    White Blood Cell Count 8.78 4.16 - 9.95 x10E3/uL    Red Blood Cell Count 4.87 3.96 - 5.09 x10E6/uL    Hemoglobin 13.5 11.6 - 15.2 g/dL    Hematocrit 40.1 02.7 - 45.2 %    Mean Corpuscular Volume 88.7 79.3 - 98.6 fL  Mean Corpuscular Hemoglobin 27.7 26.4 - 33.4 pg    MCH Concentration 31.3 (L) 31.5 - 35.5 g/dL    Red Cell Distribution Width-SD 44.8 36.9 - 48.3 fL    Red Cell Distribution Width-CV 13.8 11.1 - 15.5 %    Platelet Count, Auto 269 143 - 398 x10E3/uL    Mean Platelet Volume 10.0 9.3 - 13.0 fL    Nucleated RBC%, automated 0.0 No Ref. Range %    Absolute Nucleated RBC Count 0.00 0.00 - 0.00 x10E3/uL   Differential, Automated   Result Value Ref Range    Neutrophil Percent, Auto 65.9 No Ref. Range %    Lymphocyte Percent, Auto 25.2 No Ref. Range %    Monocyte Percent, Auto 6.9 No Ref. Range %    Eosinophil Percent, Auto 1.1 No Ref. Range %    Basophil Percent, Auto 0.6 No Ref. Range %    Immature Granulocytes% 0.3 No Reference Range %    Absolute Neut Count 5.90 1.80 - 6.90 x10E3/uL    Absolute Lymphocyte Count 2.26 1.30 - 3.40 x10E3/uL    Absolute Mono Count 0.62 0.20 - 0.80 x10E3/uL    Absolute Eos Count 0.10 0.00 - 0.50 x10E3/uL    Absolute Baso Count 0.05 0.00 - 0.10 x10E3/uL    Absolute Immature Gran Count 0.03 0.00 - 0.04 x10E3/uL   CBC with differential    Narrative    The following orders were created for panel order CBC with differential.  Procedure                               Abnormality         Status                     ---------                               -----------         ------                     BJY[[id number]]                          Abnormal            Final result               Differential, Automated[[id number]]                          Final result                 Please view results for these tests on the individual orders.       CMP:   Results for orders placed or performed during the hospital encounter of 09/07/20   Comprehensive Metabolic Panel   Result Value Ref Range    Sodium 140 135 - 146 mmol/L    Potassium 4.0 3.6 - 5.3 mmol/L    Chloride 101 96 - 106 mmol/L    Total CO2 29 20 - 30 mmol/L    Anion Gap 10 8 - 19 mmol/L    Glucose 82 65 - 99 mg/dL    Creatinine 0.86 5.78 - 1.30 mg/dL    GFR Estimate for African American >89 See GFR Additional Information mL/min/1.50m2    GFR  Estimate for Non-African American >89 See GFR Additional Information mL/min/1.52m2    GFR Additional Information See Comment     Urea Nitrogen 18 7 - 22 mg/dL    Calcium 9.6 8.6 - 04.5 mg/dL    Total Protein 7.7 6.1 - 8.2 g/dL    Albumin 4.0 3.9 - 5.0 g/dL    Bilirubin,Total 0.3 0.1 - 1.2 mg/dL    Alkaline Phosphatase 87 37 - 113 U/L    Aspartate Aminotransferase 23 13 - 62 U/L    Alanine Aminotransferase 23 8 - 70 U/L     Hgb A1C:   Lab Results   Component Value Date/Time    HGBA1C 6.7 (H) 06/29/2020 08:19 AM     Lipids:   Results for orders placed or performed in visit on 06/29/20   Lipid Panel   Result Value Ref Range    Cholesterol 185 See Comment mg/dL    Cholesterol,LDL,Calc 120 (H) <100 mg/dL    Cholesterol, HDL 44 (L) >50 mg/dL    Triglycerides 409 <811 mg/dL    Non-HDL,Chol,Calc 914 (H) <130 mg/dL      TSH:   Lab Results   Component Value Date/Time    TSH 1.6 09/07/2020 01:13 PM        Imaging Studies:   NM Cardiac Stress Treadmill (07/23/20):   1. Normal exercise stress ECG study at an adequate cardiac workload.   2. Good exercise tolerance.   3. Baseline blood pressure category is elevated (stage I hypertension). Blood pressure response to stress was exaggerated.   4. Patient's symptoms were not suggestive of ischemia.   5. ECG findings are not suggestive of ischemia.   6. No exercise induced arrhythmias were noted.  Impression:  ???  1) Normal exercise stress myocardial perfusion imaging SPECT study at an adequate cardiac workload. There is no scintigraphic evidence of ischemia or prior myocardial infarction.  ???  2) Normal left ventricular size and systolic function. LVEF  65%.  ???  3) No previous cardiac nuclear stress test was available for comparison.  ???  4) Please see the separate cardiology report for physiologic and electrocardiographic findings.    A&P   Ravleen Ries is a 60 y.o. female presenting for   Chief Complaint   Patient presents with   ??? Follow-up     ER       PROBLEM & ORDERS    ICD-10-CM    1. Nonintractable headache, unspecified chronicity pattern, unspecified headache type  R51.9 Referral to Neurology   2. Candidate for statin therapy due to risk of future cardiovascular event  Z91.89 Lipid Panel   3. Abnormal finding on MRI of brain  R90.89 Referral to Neurology     MR brain wo contrast   4. Postablative hypothyroidism  E89.0 levothyroxine 100 mcg tablet   5. Hypertension, essential  I10 olmesartan-hydroCHLOROthiazide 40-25 mg tablet     ASSESSMENT  Problem List Items Addressed This Visit        Endocrine    ??? Hypothyroidism     Overview      S/p RAI in 30's.  07/06/2020 Chronic, stable. Euthyroid on labs from 06/29/20. C/w current regimen of Levothyroxine 100 mcg every day.             Circulatory    ??? Hypertension, essential     Overview      06/21/2020 Chronic,s table. C/w Benicar 40/25 mg x1/2 tablet every day. Reassess at next in office appointment.   08/31/2020  Chronic, stable.  BP acceptable. C/w Benicar 40/25 mg x1/2 tablet every day.             Other    ??? Candidate for statin therapy due to risk of future cardiovascular event     Overview      08/31/2020 Chronic, stable. Start Atorvastatin 10 mg every day; recommended moderate to high intensity statin but patient declined.              Nonintractable headache, unspecified chronicity pattern, unspecified headache type  Abnormal finding on MRI of brain  -     aspirin 81 mg EC tablet; Take 1 tablet (81 mg total) by mouth daily.  -     Referral to Neurology -     MR brain wo contrast; Future; Expected date: 10/10/2020  Patient with chronic neck pain, headaches, dizziness, and intermittent confusion likely contributed by her significant history of cervical myeloradiculopathy, severe cervical spinal canal stenosis, and new e/o an evolving subacute to chronic infarct of the posterior insular cortex, per recent MR brain results on 09/07/20  For possible brain infarct, will start patient on daily baby aspirin for primary prevention.  She is already protected on statin.  Repeat MR brain in 1 month for surveillance of infarct.  Recc neurology consult as well.  Can hold baby aspirin 5-7d prior to surgery if going for ACDF with NSGY - still needs to decide on and schedule procedure    Candidate for statin therapy due to risk of future cardiovascular event  -     Lipid Panel; Future  Continue lipitor 40mg  oral at bedtime  2018 ACC/AHA guidelines recommends moderate or high-intensity statin because of diabetes.   Cholesterol,LDL,Calc (mg/dL)   Date Value   30/86/5784 120 (H)     Postablative hypothyroidism  -     levothyroxine 100 mcg tablet; take 1 tablet by mouth every morning before breakfast.  Euthyroid on levothyroxine qAM before breakfast  Lab Results   Component Value Date    TSH 1.6 09/07/2020     Hypertension, essential  -     olmesartan-hydroCHLOROthiazide 40-25 mg tablet; Take 0.5 tablets by mouth daily.  Continue half a tablet of benicar daily.    Low salt diet and exercise   RTC in 3 weeks.  Will check BP    Orders Placed This Encounter   ??? MR brain wo contrast   ??? Lipid Panel   ??? Referral to Neurology   ??? aspirin 81 mg EC tablet   ??? olmesartan-hydroCHLOROthiazide 40-25 mg tablet   ??? levothyroxine 100 mcg tablet     The above recommendation were discussed with the patient.  The patient has all questions answered satisfactorily and is in agreement with this recommended plan of care.    Scribe Signature:  I, Quentin Ore, have scribed for Dr. Clint Lipps with the documentation for Jaylenn Baiza in Internal Medicine [facility] on 09/09/2020 at 10:19 AM.    Physician Signature:   I have reviewed this note, scribed by Quentin Ore and attest that it is an accurate representation of my H & P and other events of the outpatient visit except if otherwise noted.  Clint Lipps, DO 09/09/2020 10:19 AM     Clinical Instructor  Department of Medicine  09/09/2020 10:18 AM

## 2020-09-09 NOTE — ED Notes
Upon discharge pt stated she will call the car to have them send an uber/Lyft to pick her up. Also, that she needed me to be in the background to let them know she is in the hospital. Upon calling Call the car, the representative stated that the pt was supposed to call 24hrs ago. Pt stated ''Go see your other patients, I will call you if I need you.'' Awaiting pt to be discharged. Provided with paperwork.

## 2020-09-10 DIAGNOSIS — R9089 Other abnormal findings on diagnostic imaging of central nervous system: Secondary | ICD-10-CM

## 2020-09-13 ENCOUNTER — Ambulatory Visit: Payer: PRIVATE HEALTH INSURANCE

## 2020-09-13 ENCOUNTER — Ambulatory Visit: Payer: MEDICAID

## 2020-09-13 DIAGNOSIS — R2689 Other abnormalities of gait and mobility: Secondary | ICD-10-CM

## 2020-09-14 ENCOUNTER — Ambulatory Visit: Payer: MEDICAID

## 2020-09-14 ENCOUNTER — Institutional Professional Consult (permissible substitution): Payer: MEDICAID

## 2020-09-14 ENCOUNTER — Ambulatory Visit: Payer: BLUE CROSS/BLUE SHIELD

## 2020-09-14 DIAGNOSIS — Z0130 Encounter for examination of blood pressure without abnormal findings: Secondary | ICD-10-CM

## 2020-09-14 NOTE — Progress Notes
PATIENT: Chelsea [name]  MRN: [id number]  DOB: June 30, 1960  DATE OF SERVICE: 09/14/2020    REFERRING PRACTITIONER: Ashley Jacobs., MD  PRIMARY CARE PROVIDER: Clint Lipps, DO  PRIMARY SLEEP PROVIDER: Ashley Jacobs., MD    Return PAP Appointment [phone])  Primary Complaint: Obstructive Sleep Apnea  DME: [facility]  Name of Machine and Settings: ResMed AirCurve 10 VAuto  Min EPAP: 6 (cmH2O)  Max IPAP: 22.2 (cmH2O)  Pressure support: 4 (cmH2O)  RAMP: off  FLEX/EPR: n/a  PRESSURE CHECK (yes/no): no  Approximate Date of Receiving machine: 07/26/2017  Name and Type of Mask(s): Resmed Airfit N20 Nasal mask, Large Cushion -- previous  Respironics Wisp Nasal Mask, S/M cushion -- current  Does patient use/need chin strap? (yes/no): no  What Type of Tube/Hose? (Standard/Heated Coil): Heated coil  Is the PAP connected to the cloud? (Y/N) yes  ???  Data Range: See attached compliance report below  ___________________________________________________________________________  Overview of PAP Therapy/ Importance of PAP therapy (yes/no): yes  Check condition of Filter, Humidifier and Tubing (yes/no): yes  Instructions on how to clean and maintain equipment (yes/no):yes  Check Mask and Adjust for comfort (yes/no):yes  Fitting of Mask, give samples if appropriate (yes/no):no  Download SD card or Remote Cloud access (if data available):wireless  ____________________________________________________________________________  Explanation of Pressure Relief Function (EPR/+Flex):n/a  Check for Ramp setting and explanation of Ramp function (yes/no):yes  Show how to look for usage data for personal tracking (Remote/Onboard):yes  ____________________________________________________________________________  Review the Patient???s experience with PAP to date (Note here of secondary complaint (ie, day time sleepiness, leaks, mask discomfort):  Pt presents to clinic with PAP equipment; compliance report was discussed; comfortable with current interface, device use, and ordering supplies; encouraged to keep using PAP and reminded the importance of using it for more than 4 hours every night; reviewed compliance guidelines, cleaning, replacing, and ordering supplies; given overview of machine including ramp, mask, tubing connections and humidity settings;  ____________________________________________________________________________  Review Optimal Sleep Hygiene and Desensitization Techniques (yes/no):yes  Travel considerations (yes/no):no  Reminder to always bring Data Card to MD visit and Machine and Equipment for RT visit  (yes/no):yes  Explanation of Equipment replacement Schedule, Compliance Rules, 30 days mask exchange, Rx for supplies (yes/no):yes  Rx submitted (yes/no):no  Verify Patient understanding and Provide Action Plan/ Scheduling (yes/no):yes  Does the Patient express that they will be able to use the PAP Consistently? (yes/no) :yes                          If Yes, Schedule regular F/U for MD/RT: RT follow up in 1 year or sooner if needed                          If No, Expedited MD F/U:                      Author:  Barbie Haggis 09/14/2020 11:53 AM     Agree with PAP management as stated above.      Trinna Post, MD [phone])

## 2020-09-16 ENCOUNTER — Ambulatory Visit: Payer: BLUE CROSS/BLUE SHIELD

## 2020-09-17 ENCOUNTER — Ambulatory Visit: Payer: MEDICAID

## 2020-09-17 ENCOUNTER — Inpatient Hospital Stay: Payer: BLUE CROSS/BLUE SHIELD

## 2020-09-17 ENCOUNTER — Ambulatory Visit: Payer: BLUE CROSS/BLUE SHIELD

## 2020-09-17 DIAGNOSIS — G959 Disease of spinal cord, unspecified: Secondary | ICD-10-CM

## 2020-09-20 ENCOUNTER — Ambulatory Visit: Payer: BLUE CROSS/BLUE SHIELD

## 2020-09-20 DIAGNOSIS — R102 Pelvic and perineal pain: Secondary | ICD-10-CM

## 2020-09-20 NOTE — Progress Notes
PATIENT: Chelsea [name]  MRN: [id number]  DOB: 22-Feb-1961  DATE OF SERVICE: 09/20/2020    CHIEF COMPLAINT: No chief complaint on file.       HPI   Chelsea [name] is a 60 y.o. female who  has a past medical history of Abnormal cardiovascular stress test (06/21/2020), Diabetes mellitus (HCC/RAF) (02/20/2017), Hypertension, essential (06/21/2020), Hypothyroidism (06/21/2020), Myelopathy concurrent with and due to spinal stenosis of cervical region (HCC/RAF) (06/21/2020), and OSA (obstructive sleep apnea) (06/21/2020). who presents for No chief complaint on file.      Recurrent severe headaches  Possible evolving subacute to chronic infarct of the brain  Hypercholesterolemia with elevated ASCVD risk  Patient presents with acute intermittent disorientation with severe headaches since presumably 09/06/20.  She reports forgetting her birthday month.  She does have chronic history of neck pain, headaches, imbalance, and peripheral neuropathy due to her cervical spine disease, further below.    Also +history of concussions with forehead trauma.  She feels her forehead still looks very different to her.  She was seen at urgent care on the same day, and has since had two [facility] visits on 09/07/20 and earlier today 09/09/20.  MR brain shows possible evolving subacute to chronic infarct within the posterior insular cortex.  She is already started on atorvastatin.  She plans to repeat imaging in 1 month as advised   Today, she reports headaches have stopped.  Dizziness improved with sitting down and rest.  She feels it is possible from doing too much but she is very overwhelmed.  She has to sleep upright due to neck pain  ???  Cervical myeloradiculopathy  Onset since forehead injury 08/25/19, with evidence of cervical spinal stenosis on imaging.  Her symptoms included severe neck pain, imbalance, headaches, and tingling in the hands.  Addressed thoroughly with Dr. Vickki Hearing.    S/p C7-T1 interlaminar ESI on 07/28/20 with initial relief but tapered over course of a month.  Imaging demonstrates multilevel degeneration with cervical stenosis most severe at C5/6.    She discussed with NSGY Dr. Konrad Felix regarding possible anterior cervical discectomy and fusion (ACDF) at C4/5 with potential corpectomy in unable to decompress the retrovertebral stenosis.  Surgical date not set yet.   On gabapentin  Followed by PMR Dr. Vickki Hearing and NSGY Dr. Konrad Felix   She also needs a referral for walker.  ???  Anxiety  Very anxious esp in s/o her chronic neck pain, headaches, and confusion  History of 20y disability due to severe panic disorder   ???  #Obesity  She reports interval weight loss of 9 pounds - today's weight is 243 lbs  In weight loss program  ???  #Hypothyroidism  S/p RAI in 30's. On LT4.  Needs refill.   ???  ???  #HTN  Chronic, stable.???Benicar 40/25 mg x1/2 tablet every day  ???  #DM2  Diagnosed with HgA1c 9%.   Negative UACR.???  Initially was on metformin but stopped after weight loss.      Hgb A1c - HPLC (%)   Date Value   06/29/2020 6.7 (H)   ???  ???  #OSA  Remains on CPAP at bedtime        Patient Care Team:  Clint Lipps, DO as PCP - General (Family Medicine)  Clint Lipps, DO as PCP - Plan Assigned (Family Medicine)    MEDS     Outpatient Medications Prior to Visit   Medication Sig   ??? aspirin 81 mg EC tablet Take 1 tablet (81 mg  total) by mouth daily.   ??? atorvastatin 40 mg tablet Take 1 tablet (40 mg total) by mouth daily.   ??? ciclopirox 8% solution Apply topically at bedtime Apply to adjacent skin and affected nails daily. Remove with alcohol every 7 days. Use up to 48 weeks.Marland Kitchen   ??? clobetasol 0.05% ointment Apply topically two (2) times daily APPLY AND GENTLY MASSAGE INTO AFFECTED AREA(S) TWICE DAILY.   ??? clobetasol 0.05% ointment Apply topically two (2) times daily APPLY AND GENTLY MASSAGE INTO AFFECTED AREA(S) TWICE DAILY. Do not apply to face, armpit, or groin.Marland Kitchen   ??? diclofenac Sodium 1% gel Apply 4 g topically .   ??? diclofenac Sodium 1% gel apply 2 to 3 gram twice a day if needed   ??? gabapentin 100 mg capsule .   ??? gabapentin 300 mg capsule take 1 capsule by mouth five times a day   ??? hydrocortisone 1% cream Apply topically.   ??? levothyroxine 100 mcg tablet take 1 tablet by mouth every morning before breakfast.   ??? medroxyPROGESTERone 10 mg tablet Take 1 tablet (10 mg total) by mouth daily.   ??? methylPREDNISolone 32 mg tablet 1 tab PO 12 hours before MRI and 1 tab PO 2 hours before MRI. (Patient not taking: Reported on 09/06/2020.)   ??? metoprolol succinate 25 mg 24 hr tablet take 1/2 tablet by mouth once daily   ??? olmesartan-hydroCHLOROthiazide 40-25 mg tablet Take 0.5 tablets by mouth daily.     No facility-administered medications prior to visit.       PHYSICAL EXAM    There were no vitals filed for this visit.  There is no height or weight on file to calculate BMI.  Wt Readings from Last 3 Encounters:   09/09/20 (!) 245 lb (111.1 kg)   09/08/20 (!) 245 lb (111.1 kg)   09/07/20 (!) 247 lb (112 kg)          LABS/STUDIES   I have:   []  Reviewed/ordered []  1 []  2 []  ? 3 unique laboratory, radiology, and/or diagnostic tests noted below    []  Reviewed []  1 []  2 []  ? 3 prior external notes and incorporated into patient assessment    []  Discussed management or test interpretation with external provider(s) as noted       Lab Studies:  {Labs Reviewed [id number]:[id number]}     Imaging Studies:   {Images Reviewed:[id number]}    A&P   Chelsea [name] is a 60 y.o. female presenting for No chief complaint on file.        PROBLEM & ORDERS  No diagnosis found.    ASSESSMENT      There are no diagnoses linked to this encounter.  No orders of the defined types were placed in this encounter.      @orersenc @    The above recommendation were discussed with the patient.  The patient has all questions answered satisfactorily and is in agreement with this recommended plan of care.        No follow-ups on file.     Clint Lipps, DO  Clinical Instructor  Department of Medicine  09/20/2020 8:54 AM

## 2020-09-22 ENCOUNTER — Ambulatory Visit: Payer: BLUE CROSS/BLUE SHIELD

## 2020-09-22 ENCOUNTER — Ambulatory Visit: Payer: MEDICAID

## 2020-09-22 DIAGNOSIS — D219 Benign neoplasm of connective and other soft tissue, unspecified: Secondary | ICD-10-CM

## 2020-09-22 DIAGNOSIS — N390 Urinary tract infection, site not specified: Secondary | ICD-10-CM

## 2020-09-22 DIAGNOSIS — R300 Dysuria: Secondary | ICD-10-CM

## 2020-09-22 DIAGNOSIS — E119 Type 2 diabetes mellitus without complications: Secondary | ICD-10-CM

## 2020-09-22 LAB — UA,Microscopic: SQUAMOUS EPITHELIAL CELLS: 1 {cells}/uL (ref 0–17)

## 2020-09-22 LAB — UA,Dipstick: BILIRUBIN: NEGATIVE (ref 1.005–1.030)

## 2020-09-22 MED ORDER — ATORVASTATIN CALCIUM 10 MG PO TABS
10 mg | ORAL_TABLET | Freq: Every day | ORAL | 1 refills | Status: AC
Start: 2020-09-22 — End: ?

## 2020-09-22 NOTE — Progress Notes
PATIENT: Chelsea [name]  MRN: [id number]  DOB: Nov 09, 1960  DATE OF SERVICE: 09/22/2020    CHIEF COMPLAINT:   Chief Complaint   Patient presents with   ??? Urinary Tract Infection        HPI   Chelsea [name] is a 60 y.o. female who  has a past medical history of Abnormal cardiovascular stress test (06/21/2020), Diabetes mellitus (HCC/RAF) (02/20/2017), Hypertension, essential (06/21/2020), Hypothyroidism (06/21/2020), Myelopathy concurrent with and due to spinal stenosis of cervical region (HCC/RAF) (06/21/2020), and OSA (obstructive sleep apnea) (06/21/2020). who presents for   Chief Complaint   Patient presents with   ??? Urinary Tract Infection     Reports L sided pelvic pain. Pressure worsens after urination. No dysuria, urinary frequency, f/c, n/v. Has vaginal bleeding from fibroids.     #Fibroids  Planning on embolization with GYN    #Recurrent severe headaches  #Possible evolving subacute to chronic infarct of the brain  #Hypercholesterolemia with elevated ASCVD risk  Patient presents with acute intermittent disorientation with severe headaches since presumably 09/06/20.  She reports forgetting her birthday month.  She does have chronic history of neck pain, headaches, imbalance, and peripheral neuropathy due to her cervical spine disease, further below.    Also +history of concussions with forehead trauma.  She feels her forehead still looks very different to her.  She was seen at urgent care on the same day, and has since had two [facility] visits on 09/07/20 and earlier today 09/09/20.  MR brain shows possible evolving subacute to chronic infarct within the posterior insular cortex.  She is already started on atorvastatin.  She plans to repeat imaging in 1 month as advised   Today, she reports headaches have stopped.  Dizziness improved with sitting down and rest.  She feels it is possible from doing too much but she is very overwhelmed.  She has to sleep upright due to neck pain  09/22/2020 Started on ASA 81 mg every day + Atorvastatin 40 mg every day at last visit. She has not been taking the Atorvastatin. She has been taking the aspirin. Has an appointment with neuro on 10/25/20.     #HTN  09/22/2020 Chronic, stable.???Benicar 40/25 mg x1/2 tablet every day  ???  #DM2  Diagnosed with HgA1c 9%.   Initially treated with MFM then lost significant weight  09/22/2020   Hgb A1c - HPLC   Date Value Ref Range Status   09/14/2020 6.6 (H) <5.7 % Final     Comment:     For patients with diabetes, an A1c less than (<) or equal (=) to 7.0% is recommended for most patients, however the goal may be higher or lower depending on age and/or other medical problems.   For a diagnosis of diabetes, A1c greater than (>) or equal(=) to 6.5% indicates diabetes; values between 5.7% and 6.4% may indicate an increased risk of developing diabetes.   06/29/2020 6.7 (H) <5.7 % Final     Comment:     For patients with diabetes, an A1c less than (<) or equal (=) to 7.0% is recommended for most patients, however the goal may be higher or lower depending on age and/or other medical problems.   For a diagnosis of diabetes, A1c greater than (>) or equal(=) to 6.5% indicates diabetes; values between 5.7% and 6.4% may indicate an increased risk of developing diabetes.            Not addressed this visit:  Cervical myeloradiculopathy  Onset since forehead injury  08/25/19, with evidence of cervical spinal stenosis on imaging.  Her symptoms included severe neck pain, imbalance, headaches, and tingling in the hands.  Addressed thoroughly with Dr. Vickki Hearing.    S/p C7-T1 interlaminar ESI on 07/28/20 with initial relief but tapered over course of a month.  Imaging demonstrates multilevel degeneration with cervical stenosis most severe at C5/6.    Planning on surgical intervention with neurosurg Dr. Konrad Felix  ???  Anxiety  Very anxious esp in s/o her chronic neck pain, headaches, and confusion  History of 20y disability due to severe panic disorder   ???  #Obesity  She reports interval weight loss of 9 pounds - today's weight is 243 lbs  In weight loss program  ???  #Hypothyroidism  S/p RAI in 30's. On LT4.Marland Kitchen  ???  #OSA  Remains on CPAP at bedtime        Patient Care Team:  Clint Lipps, DO as PCP - General (Family Medicine)  Clint Lipps, DO as PCP - Plan Assigned (Family Medicine)    MEDS     Outpatient Medications Prior to Visit   Medication Sig   ??? aspirin 81 mg EC tablet Take 1 tablet (81 mg total) by mouth daily.   ??? atorvastatin 40 mg tablet Take 1 tablet (40 mg total) by mouth daily.   ??? ciclopirox 8% solution Apply topically at bedtime Apply to adjacent skin and affected nails daily. Remove with alcohol every 7 days. Use up to 48 weeks.Marland Kitchen   ??? clobetasol 0.05% ointment Apply topically two (2) times daily APPLY AND GENTLY MASSAGE INTO AFFECTED AREA(S) TWICE DAILY.   ??? clobetasol 0.05% ointment Apply topically two (2) times daily APPLY AND GENTLY MASSAGE INTO AFFECTED AREA(S) TWICE DAILY. Do not apply to face, armpit, or groin.Marland Kitchen   ??? diclofenac Sodium 1% gel Apply 4 g topically .   ??? diclofenac Sodium 1% gel apply 2 to 3 gram twice a day if needed   ??? gabapentin 100 mg capsule .   ??? gabapentin 300 mg capsule take 1 capsule by mouth five times a day   ??? hydrocortisone 1% cream Apply topically.   ??? levothyroxine 100 mcg tablet take 1 tablet by mouth every morning before breakfast.   ??? medroxyPROGESTERone 10 mg tablet Take 1 tablet (10 mg total) by mouth daily.   ??? metoprolol succinate 25 mg 24 hr tablet take 1/2 tablet by mouth once daily   ??? olmesartan-hydroCHLOROthiazide 40-25 mg tablet Take 0.5 tablets by mouth daily.   ??? methylPREDNISolone 32 mg tablet 1 tab PO 12 hours before MRI and 1 tab PO 2 hours before MRI. (Patient not taking: Reported on 09/06/2020.)     No facility-administered medications prior to visit.       PHYSICAL EXAM      Last Recorded Vital Signs:    09/22/20 1030   BP: 112/73   Pulse: 84   Resp: 16   Temp: 36.2 ???C (97.1 ???F)   SpO2: 100%     Body mass index is 46.27 kg/m???.  Wt Readings from Last 3 Encounters:   09/22/20 (!) 253 lb (114.8 kg)   09/09/20 (!) 245 lb (111.1 kg)   09/08/20 (!) 245 lb (111.1 kg)      Gen - Awake. No acute distress. Age appropriate appearance.   Eyes - EOMI. No conjunctival pallor or injection. No scleral icterus.  Neck - Supple. No thyroid enlargement, tenderness, or nodules. No cervical, supraclavicular or infraclavicular lymphadenopathy.   Pulm - Nml effort.  Clear to auscultation bilaterally. No wheezes, rales, or rhonchi.  Cards - RRR. S1 S2. No murmurs, rubs, or gallops.   GI - Soft. Nondistended. Nontender to palpation in all quadrants. No rebound, guarding, or rigidity.   MSK - No cyanosis, clubbing, or LE edema.   Neuro - AAOx3. CN2-12 grossly intact.  Psych - Normal mood and affect.       LABS/STUDIES   I have:   []  Reviewed/ordered []  1 []  2 []  ? 3 unique laboratory, radiology, and/or diagnostic tests noted below    []  Reviewed []  1 []  2 []  ? 3 prior external notes and incorporated into patient assessment    []  Discussed management or test interpretation with external provider(s) as noted       Lab Studies:  CBC:   Results for orders placed or performed during the hospital encounter of 09/07/20   CBC   Result Value Ref Range    White Blood Cell Count 8.78 4.16 - 9.95 x10E3/uL    Red Blood Cell Count 4.87 3.96 - 5.09 x10E6/uL    Hemoglobin 13.5 11.6 - 15.2 g/dL    Hematocrit 95.6 21.3 - 45.2 %    Mean Corpuscular Volume 88.7 79.3 - 98.6 fL    Mean Corpuscular Hemoglobin 27.7 26.4 - 33.4 pg    MCH Concentration 31.3 (L) 31.5 - 35.5 g/dL    Red Cell Distribution Width-SD 44.8 36.9 - 48.3 fL    Red Cell Distribution Width-CV 13.8 11.1 - 15.5 %    Platelet Count, Auto 269 143 - 398 x10E3/uL    Mean Platelet Volume 10.0 9.3 - 13.0 fL    Nucleated RBC%, automated 0.0 No Ref. Range %    Absolute Nucleated RBC Count 0.00 0.00 - 0.00 x10E3/uL   Differential, Automated   Result Value Ref Range    Neutrophil Percent, Auto 65.9 No Ref. Range %    Lymphocyte Percent, Auto 25.2 No Ref. Range %    Monocyte Percent, Auto 6.9 No Ref. Range %    Eosinophil Percent, Auto 1.1 No Ref. Range %    Basophil Percent, Auto 0.6 No Ref. Range %    Immature Granulocytes% 0.3 No Reference Range %    Absolute Neut Count 5.90 1.80 - 6.90 x10E3/uL    Absolute Lymphocyte Count 2.26 1.30 - 3.40 x10E3/uL    Absolute Mono Count 0.62 0.20 - 0.80 x10E3/uL    Absolute Eos Count 0.10 0.00 - 0.50 x10E3/uL    Absolute Baso Count 0.05 0.00 - 0.10 x10E3/uL    Absolute Immature Gran Count 0.03 0.00 - 0.04 x10E3/uL   CBC with differential    Narrative    The following orders were created for panel order CBC with differential.  Procedure                               Abnormality         Status                     ---------                               -----------         ------                     YQM[[id number]]  Abnormal            Final result               Differential, Automated[[id number]]                          Final result                 Please view results for these tests on the individual orders.       CMP:   Results for orders placed or performed during the hospital encounter of 09/07/20   Comprehensive Metabolic Panel   Result Value Ref Range    Sodium 140 135 - 146 mmol/L    Potassium 4.0 3.6 - 5.3 mmol/L    Chloride 101 96 - 106 mmol/L    Total CO2 29 20 - 30 mmol/L    Anion Gap 10 8 - 19 mmol/L    Glucose 82 65 - 99 mg/dL    Creatinine 5.28 4.13 - 1.30 mg/dL    GFR Estimate for African American >89 See GFR Additional Information mL/min/1.50m2    GFR Estimate for Non-African American >89 See GFR Additional Information mL/min/1.63m2    GFR Additional Information See Comment     Urea Nitrogen 18 7 - 22 mg/dL    Calcium 9.6 8.6 - 24.4 mg/dL    Total Protein 7.7 6.1 - 8.2 g/dL    Albumin 4.0 3.9 - 5.0 g/dL    Bilirubin,Total 0.3 0.1 - 1.2 mg/dL    Alkaline Phosphatase 87 37 - 113 U/L    Aspartate Aminotransferase 23 13 - 62 U/L    Alanine Aminotransferase 23 8 - 70 U/L     Hgb A1C:   Lab Results Component Value Date/Time    HGBA1C 6.6 (H) 09/14/2020 08:47 AM     Lipids:   Results for orders placed or performed in visit on 09/14/20   Lipid Panel   Result Value Ref Range    Cholesterol 166 See Comment mg/dL    Cholesterol,LDL,Calc 109 (H) <100 mg/dL    Cholesterol, HDL 39 (L) >50 mg/dL    Triglycerides 91 <010 mg/dL    Non-HDL,Chol,Calc 272 <130 mg/dL      TSH:   Lab Results   Component Value Date/Time    TSH 1.6 09/07/2020 01:13 PM     POC U/A:   Results for orders placed or performed in visit on 09/22/20   POCT urinalysis dipstick   Result Value Ref Range    Glucose Negative Negative    Bilirubin Negative Negative    Ketone Negative Negative    Specific Gravity 1.020 <1.005, 1.005, 1.010, 1.015, 1.020, 1.025, 1.030    Blood Negative Negative    pH 5.5 <5.0, 5.0, 5.5, 6.0, 6.5, 7.0, 7.5, 8.0, 8.5    Protein Negative Negative    Urobilinogen 0.2 mg/dL 0.2 mg/dL, 1.0 mg/dL    Nitrite Negative Negative    Leukocyte Negative Negative     UA:   Lab Results   Component Value Date/Time    PHUR 5.5 09/22/2020 10:52 AM    SPECGRAVUR 1.020 09/22/2020 10:52 AM    BLDUR Negative 09/22/2020 10:52 AM    GLUCOSEUR Negative 09/22/2020 10:52 AM    KETONESUR Negative 09/22/2020 10:52 AM    PROTUR Negative 09/22/2020 10:52 AM    LEUKESTUR Negative 09/22/2020 10:49 AM    NITRITEUR Negative 09/22/2020 10:49 AM    RBCSUR 4 09/22/2020 10:49 AM  WBCSUR Negative 09/22/2020 10:52 AM        Imaging Studies:   MRI Brain (09/07/20):  MRA Brain/Neck (09/07/20):  IMPRESSION:   ???  MRI BRAIN:   1.  No acute infarct or intracranial hemorrhage.  2.  Nonspecific FLAIR hyperintensity within the posterior insular cortex, which may represent evolving subacute to chronic infarct.  Consider 1 month follow-up to assess temporal change.  ???  MRA BRAIN: No proximal large vessel occlusion or high grade stenosis.  ???  MRA NECK: No occlusion or high grade stenosis.     A&P   Chelsea [name] is a 60 y.o. female presenting for   Chief Complaint   Patient presents with   ??? Urinary Tract Infection         PROBLEM & ORDERS    ICD-10-CM    1. Urinary tract infection without hematuria, site unspecified  N39.0    2. Type 2 diabetes mellitus without complication, without long-term current use of insulin (HCC/RAF)  E11.9    3. Candidate for statin therapy due to risk of future cardiovascular event  Z91.89 atorvastatin 10 mg tablet   4. Fibroid  D21.9    5. Dysuria  R30.0 POCT urinalysis dipstick   6. Abnormal cardiovascular stress test  R94.39    7. Abnormal finding on MRI of brain  R90.89    8. Hypertension, essential  I10        ASSESSMENT  Problem List Items Addressed This Visit        HCC Codes    ??? Diabetes mellitus (HCC/RAF)     Overview      06/21/2020 Chronic, stable. Last HgA1c 6.6% on 06/03/19. C/w MFM 500 mg BID. Repeat HgA1c. Recommended starting Atorvastatin 40 mg every day but patient wishes to repeat labs first. Will need to clarify last retinopathy screening at next visit.   07/06/2020 Chronic, stable. HgA1c 6.7% on 06/29/20 with negative UACR. Recommended resuming MFM XR 500 mg every day, declined at this time. Start Atorvastatin 20 mg every day with plans to uptitrate. Recommended pneumococcal vaccine, which was declined at this time. Ophtho referral for retinopathy screening.   07/16/2020 Chronic, stable. Did not resume MFM XR 500 mg every day. Reiterated my recommendation to restart MFM 500 mg every day. Recommended restarted Atorvastatin 20 mg every day.   08/31/2020 Chronic, stable. Patient declines medication. C/w diet and exercise. Has appt for retinopathy screening at [facility]. Recommended PPSV23, which was declined. Recommended moderate to high intesnity statin, which was declined. Therefore will start Atorvastatin 10 mg every day.   09/22/2020 Chronic, stable. HgA1c 6.6% on 09/14/20. C/w diet and exercise. Start Atorvastatin 10 mg every day.             Other    ??? Abnormal cardiovascular stress test     Overview      Preop exam showed abnormal stress test with no EKG e/o ischemia but e/o large anterior wall ischemia with recommendation for cardiac catheterization. Had second opinion with repeat exercise nuclear stress test showing no e/o ischemia and was recommended against cardiac catheterization. Seen by Cardiology Dr. Theodoro Grist with repeat NM treadmill stress test 07/23/20 without e/o inducible ischemia.          ??? Abnormal finding on MRI of brain     Overview      MRI brain ordered for w/u of H/A showed possible evolving subacute to chronic infarct in the posterior insular cortex. No neurologic deficits on exam. C/w ASA 81 mg  every day + Atorvastatin 10 mg every day (patient refused higher dose). Neurology referral provided.          ??? Candidate for statin therapy due to risk of future cardiovascular event     Overview      09/10/2020  Chronic, stable. Start Atorvastatin 40 mg every day given DM2 + age + abnormal MRI brain finding c/f possible evolving subacute to chronic infarct. Will start ASA 81 mg every day as well and recommended f/u with neurology to discuss.   09/22/2020 Chronic, stable. Patient is very hesitant to take cholesterol lowering medication despite counseling on indication with DM2 + age > 40 and possible subacute to chronic infarct on MRI brain. Patient agreeable to start Atorvastatin 10 mg every day, c/w ASA 81 mg every day, and f/u with neurology. Repeat MRI brain previously ordered.          ??? Fibroid     Overview      Chronic, stable. Suspect L sided pelvic pain is due to fibroids. Urine dip sterile in office today. Patient to f/u with GYN for consideration of embolization.          ??? Hypertension, essential     Overview      06/21/2020 Chronic,s table. C/w Benicar 40/25 mg x1/2 tablet every day. Reassess at next in office appointment.   09/22/2020   Chronic, stable. BP acceptable. C/w Benicar 40/25 mg x1/2 tablet every day.                  Diagnoses and all orders for this visit:    Urinary tract infection without hematuria, site unspecified  -     Urinalysis w/Reflex to Culture    Type 2 diabetes mellitus without complication, without long-term current use of insulin (HCC/RAF)    Candidate for statin therapy due to risk of future cardiovascular event  -     atorvastatin 10 mg tablet; Take 1 tablet (10 mg total) by mouth daily.    Fibroid    Dysuria  -     POCT urinalysis dipstick    Abnormal cardiovascular stress test    Abnormal finding on MRI of brain    Hypertension, essential      Orders Placed This Encounter   ??? UA,Dipstick   ??? UA,Microscopic   ??? POCT urinalysis dipstick   ??? atorvastatin 10 mg tablet       The above recommendation were discussed with the patient.  The patient has all questions answered satisfactorily and is in agreement with this recommended plan of care.      Clint Lipps, DO  Clinical Instructor  Department of Medicine  09/22/2020 7:32 PM

## 2020-09-23 ENCOUNTER — Telehealth: Payer: MEDICAID

## 2020-09-23 ENCOUNTER — Ambulatory Visit: Payer: MEDICAID

## 2020-09-23 NOTE — Telephone Encounter
Appointment Accommodation Request      Appointment Type: ret gyn     Reason for sooner request: vaginal infection and follow up to procedure     Date/Time Requested (If any): any time asap     Last seen by MD: 08/31/2020    Any Symptoms:  '[x]'  Yes  '[]'  No       If yes, what symptoms are you experiencing: vaginal infection and odor    o Duration of symptoms (how long): ongoing     Patient or caller was offered an appointment but declined. n/a    Patient or caller was advised to seek emergency services if conditions are urgent or emergent. n/a    Patient has been notified of the 24 hour turnaround time. Yes

## 2020-09-28 ENCOUNTER — Telehealth: Payer: PRIVATE HEALTH INSURANCE

## 2020-09-28 DIAGNOSIS — N760 Acute vaginitis: Secondary | ICD-10-CM

## 2020-09-28 NOTE — Telephone Encounter
Message to Practice/Provider      Message: Pt is asking for an accomodation appt for a yeast infection that she is having with odor. Pt is asking for an appt or a RX to be sent to [facility] on [address]. Please advise.    Thank you     Return call is  being requested by the patient or caller. Yes     Patient or caller has been notified of the 24-hour processing turnaround time if applicable. Yes

## 2020-09-29 ENCOUNTER — Ambulatory Visit: Payer: BLUE CROSS/BLUE SHIELD

## 2020-09-29 MED ORDER — FLUCONAZOLE 150 MG PO TABS
ORAL_TABLET | 0 refills | Status: AC
Start: 2020-09-29 — End: ?

## 2020-09-29 NOTE — Telephone Encounter
Dr. Minna Antis I spoke with patient and informed her about her referral for her walker was sent to [place]. Patient wanted to know if she would qualify for Home Health Physical Therapy because her appointment for Physical therapy is not until May, and she would prefer it to be from home to prepare her for the surgery. Please Advise

## 2020-09-29 NOTE — Telephone Encounter
I spoke with patient and informed her that due to her not being home bound Dr. Minna Antis is not able to place a referral for Home Health PT

## 2020-09-29 NOTE — Telephone Encounter
Notified via CC msg

## 2020-09-30 ENCOUNTER — Ambulatory Visit: Payer: MEDICAID

## 2020-10-08 ENCOUNTER — Ambulatory Visit: Payer: BLUE CROSS/BLUE SHIELD

## 2020-10-08 ENCOUNTER — Ambulatory Visit: Payer: MEDICAID

## 2020-10-12 ENCOUNTER — Inpatient Hospital Stay: Payer: BLUE CROSS/BLUE SHIELD

## 2020-10-12 ENCOUNTER — Inpatient Hospital Stay: Payer: PRIVATE HEALTH INSURANCE

## 2020-10-12 DIAGNOSIS — D219 Benign neoplasm of connective and other soft tissue, unspecified: Secondary | ICD-10-CM

## 2020-10-12 DIAGNOSIS — N950 Postmenopausal bleeding: Secondary | ICD-10-CM

## 2020-10-12 MED ADMIN — GADOBUTROL 1 MMOL/ML IV SOLN: 10 mL | INTRAVENOUS | @ 21:00:00 | Stop: 2020-10-12 | NDC 50419032512

## 2020-10-13 ENCOUNTER — Ambulatory Visit: Payer: BLUE CROSS/BLUE SHIELD

## 2020-10-14 ENCOUNTER — Ambulatory Visit: Payer: MEDICAID

## 2020-10-14 DIAGNOSIS — M4722 Other spondylosis with radiculopathy, cervical region: Secondary | ICD-10-CM

## 2020-10-14 DIAGNOSIS — M4712 Other spondylosis with myelopathy, cervical region: Secondary | ICD-10-CM

## 2020-10-14 MED ORDER — GABAPENTIN 100 MG PO CAPS
100 mg | ORAL_CAPSULE | Freq: Three times a day (TID) | ORAL | 3 refills | Status: AC
Start: 2020-10-14 — End: ?

## 2020-10-14 NOTE — Patient Instructions
PLEASE REVIEW INFORMATION BELOW:     ??? For all visits, please be ready to show your photo ID and insurance card(s), and pay any applicable co-pays that will be due at the time of visit.     ??? It is your responsibility to be sure that your health insurance plan covers any lab test, radiology procedure, consultation, orthopaedic procedures which include, but not limit, injections, casting, bracing, durable medical equipment, etc. which has been recommended for you.  Contacting the Office:  ??? Each physician office has a Patient Care Coordinator that will help coordinate your care. They can help with:  ??? General questions  ??? Relay specific questions to the doctor   ??? Schedule surgery  ??? Follow up on authorizations that are pending  ??? Provide paperwork such as a return to work/school letter or disability forms  ??? Please allow 24 hours for messages to be returned from the physician???s office.  ??? There is a 7-10 business day turnaround for all forms.  ??? We encourage you to sign up for [facility] to communicate directly with the office or physician online. Please ask any staff member for additional information.   ??? Sofia: [phone],  [phone]    Following Up with the Physician:  ??? Before leaving, physician will let you know if you will need to follow-up within a certain time. If yes, make sure you schedule your follow-up appointment at the front desk or by calling the centralized scheduling center at:  ???  [phone] Orthopaedic Surgery.  ???  [phone] [facility].  ???  [phone] [facility].  ??? Did your physician order any labs, imaging, or tests that should be completed before your next follow-up? If yes, please continue reading below.  Physician Order/Pre-authorization:  ??? For any physician orders/pre-authorizations, please allow 5-7 business days for authorization to be obtained.  Imaging (MRI, X-Ray, CT Scan, etc)  ??? If you are scheduling at a [facility], please call Radiology at [phone] to schedule your imaging. You do not need to wait for an authorization to schedule.   ??? If you are scheduling outside of [facility]:  ??? Notify your physician???s office of your location choice, this will help in obtaining authorization for the correct facility.  ??? Once you complete the imaging, obtain a copy of the report and images. Provide a copy to your physician for review.  ??? Schedule a follow-up appointment to review the images, unless stated otherwise by your physician or the office.  Injections  ??? If the physician recommends an injection, plan anywhere from 1-5 follow-up visits with your physician in a specific timeframe to complete the treatment plan. The follow-up visits can be as frequent as once a week until all the dosage has been administered.  ??? If authorization is required, allow 5-7 business days for the office to obtain authorization.  Lab work  ??? If labs are ordered, keep in mind:  ??? Fasting or non-fasting?  ??? How soon must they be done? (as soon as possible, one week before next appointment, a month before next appointment, etc.)  ??? Do you need to schedule a follow-up visit to discuss results or did the physician say they will call you with the results?   ??? If lab work is completed at a [facility], your results will automatically be sent to the physician, however allow a few days for results to be completed. Some labs require more time than others for results to finalize.  ???  If lab work is completed outside a [facility], bring a copy of the lab orders with you. After a few days, follow-up with the lab to obtain a copy of your lab results for your physician.  Physical Therapy  ??? If physical therapy is recommended, notify the office of where you would prefer to seek therapy.  ??? Before doing so, verify with the physical therapy location that your insurance will be accepted.  ??? If authorization is required, allow 5-7 business days for the office to obtain authorization.  ??? If physical therapy is completed at a [facility] facility, the progress reports will automatically be sent to the physician.  ??? Physical therapy requires multiple visits to your chosen facility, anywhere from 2 weeks and more depending on the nature of your case.  Nerve Conduction/EMG Study  ??? If the physician ordered a Nerve Conduction or EMG Study, contact the Refer to Physician Office to schedule your appointment.      If you develop any severe worsening pain, new weakness or numbness please contact the office or present to the Emergency Department.  If you experience new loss of bowel or bladder you should go to the Emergency Department.

## 2020-10-14 NOTE — Progress Notes
[facility] Physiatry Follow up Note    Attending Physician: Rhetta Mura, DO    Chief Complaint:    Chief Complaint   Patient presents with   ??? Neck Pain       Today's Date: 10/14/2020    Last Visit:  08/12/2020    Since that time: Patient returns today for follow up.  The cervical ESI lasted for about 6 weeks then pain significantly returned and is worsening despite PT, tylenol and increasing gabapentin.  She has surgery planned with Dr. Konrad Felix for her cervical myeloradiculopathy.  She would like to repeat the cervical epidural to try to give her some pain relief.  Pain is sever in the neck radiating to the right arm with numbness into the hand.      Cervical IL-ESI  07/28/2020    HISTORICAL DATA     Chelsea [name] is a 60 y.o. female, who is seen for consultation and evaluation at the request of Dr. Konrad Felix for pain in the neck with radiation into the bilateral upper extremity and consideration of injection.  Patient reports she hit forehead about 9 months ago.  She then got severe neck and bilateral radiating arm pain, weakness and numbness.  Numbness is constant in the hands.  She has difficulty with arm coordination.  She reports balance issues chronically and has need a cane.      She has tried naprosyn-- doesn't tolrate  Gabapentin 300mg  1300mg  daily- mild help      Previous work-up has included: MRI  Has tried the following treatments: medications  Associated symptoms - + bilateral upper extremity tingling, numbness, or weakness.  Denies bowel or bladder incontinence.  + gait instability.    Current Medications:  Current Outpatient Medications   Medication Sig   ??? aspirin 81 mg EC tablet Take 1 tablet (81 mg total) by mouth daily.   ??? atorvastatin 10 mg tablet Take 1 tablet (10 mg total) by mouth daily.   ??? ciclopirox 8% solution Apply topically at bedtime Apply to adjacent skin and affected nails daily. Remove with alcohol every 7 days. Use up to 48 weeks.Marland Kitchen   ??? clobetasol 0.05% ointment Apply topically two (2) times daily APPLY AND GENTLY MASSAGE INTO AFFECTED AREA(S) TWICE DAILY.   ??? clobetasol 0.05% ointment Apply topically two (2) times daily APPLY AND GENTLY MASSAGE INTO AFFECTED AREA(S) TWICE DAILY. Do not apply to face, armpit, or groin.Marland Kitchen   ??? diclofenac Sodium 1% gel Apply 4 g topically .   ??? diclofenac Sodium 1% gel apply 2 to 3 gram twice a day if needed   ??? fluconazole 150 mg tablet 1 tab PO x 1. May repeat after 72h if symptoms persist/worsen.   ??? gabapentin 100 mg capsule .   ??? gabapentin 300 mg capsule take 1 capsule by mouth five times a day   ??? hydrocortisone 1% cream Apply topically.   ??? levothyroxine 100 mcg tablet take 1 tablet by mouth every morning before breakfast.   ??? medroxyPROGESTERone 10 mg tablet Take 1 tablet (10 mg total) by mouth daily.   ??? olmesartan-hydroCHLOROthiazide 40-25 mg tablet Take 0.5 tablets by mouth daily.     No current facility-administered medications for this visit.        Allergies:  Iodinated diagnostic agents    Past Medical History:   Past Medical History:   Diagnosis Date   ??? Abnormal cardiovascular stress test 06/21/2020    06/21/2020 Preop exam showed abnormal stress test with no EKG  e/o ischemia but e/o large anterior wall ischemia with recommendation for cardiac catheterization. Had second opinion with repeat exercise nuclear stress test showing no e/o ischemia and was recommended against cardiac catheterization. I recommended starting Atorvastatin 40 mg every day for her DM2, but patient wishes to repeat labs fir   ??? Diabetes mellitus (HCC/RAF) 02/20/2017    06/21/2020 Chronic, stable. Last HgA1c 6.6% on 06/03/19. C/w MFM 500 mg BID. Repeat HgA1c. Recommended starting Atorvastatin 40 mg every day but patient wishes to repeat labs first. Will need to clarify last retinopathy screening at next visit.    ??? Hypertension, essential 06/21/2020    06/21/2020 Chronic,s table. C/w Benicar 40/25 mg x1/2 tablet every day. Reassess at next in office appointment.    ??? Hypothyroidism 06/21/2020   ??? Myelopathy concurrent with and due to spinal stenosis of cervical region (HCC/RAF) 06/21/2020    06/21/2020 Radiculopathy and myelopathy symptoms since forehead injury 08/25/19. MRI cervical spine 09/30/19 showing multilevel degenerative changes and central disk/osteophyte protrusion impinging the spinal cord with severe spinal stenosis and severe BL foraminal stenosis. Originally recommended for surgical intervention, which was then cancelled due to abnormal preoperative stress test. Neurosurge   ??? OSA (obstructive sleep apnea) 06/21/2020    06/21/2020 Chronic, stable. C/w CPAP at bedtime.        Past Surgical History:   Past Surgical History:   Procedure Laterality Date   ??? CESAREAN SECTION      x2   ??? CHOLECYSTECTOMY  1987   ??? INCISIONAL HERNIA REPAIR  1987       SocHx:   Social History     Socioeconomic History   ??? Marital status: Single     Spouse name: Not on file   ??? Number of children: Not on file   ??? Years of education: Not on file   ??? Highest education level: Not on file   Occupational History   ??? Not on file   Tobacco Use   ??? Smoking status: Former Smoker     Packs/day: 0.25     Years: 5.00     Pack years: 1.25     Types: Cigarettes   ??? Smokeless tobacco: Never Used   Vaping Use   ??? Vaping Use: Never used   Substance and Sexual Activity   ??? Alcohol use: Not Currently   ??? Drug use: Not Currently   ??? Sexual activity: Not on file   Other Topics Concern   ??? Not on file   Social History Narrative   ??? Not on file     Social Determinants of Health     Physical Activity: Not on file   Stress: Not on file   Financial Resource Strain: Not on file       HABITS:       Social History     Tobacco Use   Smoking Status Former Smoker   ??? Packs/day: 0.25   ??? Years: 5.00   ??? Pack years: 1.25   ??? Types: Cigarettes   Smokeless Tobacco Never Used        Social History     Substance and Sexual Activity   Alcohol Use Not Currently        Social History     Substance and Sexual Activity   Drug Use Not Currently       FamHx: No family history on file.     Allergies:  Iodinated diagnostic agents        Relevant Labs:  Lab  Results   Component Value Date    HGBA1C 6.6 (H) 09/14/2020     Creatinine   Date Value Ref Range Status   09/07/2020 0.68 0.60 - 1.30 mg/dL Final     Lab Results   Component Value Date    WBC 8.78 09/07/2020    HGB 13.5 09/07/2020    HCT 43.2 09/07/2020    MCV 88.7 09/07/2020    PLT 269 09/07/2020       Physical Exam:     Vital Signs: Ht 5' 2'' (1.575 m)  ~ Wt (!) 250 lb (113.4 kg)  ~ BMI 45.73 kg/m???   General:  The patient is well developed, well nourished, and in no acute distress, alert with appropriate mood and affect.  She is wearing soft collar neck brace  Eyes: EOMI  Respiratory: Normal work of breathing without apnea and no evidence of respiratory distress without use of accessory muscles.      Imaging and work-up:  Imaging was reviewed personally by me and demonstrate by reporting    Cervical MRI 09/30/2019  ''IMPRESSION -     No significant change.     1. ???Multilevel degenerative changes most pronounced at C5-C6 where there is trace   retrolisthesis with a central disk/osteophyte protrusion impinging the spinal cord with   severe spinal canal stenosis. There is mild facet arthropathy and bilateral   uncovertebral hypertrophy causing severe bilateral foraminal stenosis. This affects the exiting   bilateral C6 nerve roots. This level is unchanged.     2. Reversal of the cervical lordosis suggesting muscle spasm and/or degenerative   changes.    ''  Xray cervical spine  ''IMPRESSION:   Impression:     Degenerative spondylosis most pronounced at C5-6.     No evidence for dynamic instability   ''  Commentary and Medical Decision Making:    Chelsea [name] is a 60 y.o. female who presents to clinic today for evaluation for cervical radicular pain.     Assessment:  1. Cervical myeloradiculoapthy    Plan:    Educated patient regarding complexity of neck pain and multidisciplinary approach to managing symptoms.    Diagnostic Imaging:   - Reviewed image report    Medications:   Rx for gabapentin 100mg  TID    Physical therapy:  No cervical manipulation   Continue PT    Procedures:   - Schedule cervical IL-ESI for severe radicular neck and arm pain      Follow-up:  Primary care provider for further management or medication refills and therapy renewals.     Spine Center:with spine surgery and  for injection    Advised the patient that if neurological issues were to develop such as weakness, bowel or bladder control, or worsening pain, that they should present immediately to the closest Emergency Room.    Thank you for this consultation; If I can be of further service, please contact my office.    Medical Decision Making: Moderate Complexity  1) Number and Complexity of Problems Addressed at Encounter:  - 1 chronic condition with exacerbation     2) Data Reviewed and Analyzed today:  - Tests/Documents (3)  - Reviewed prior external notes from referring provider, PCP, or surgeon  - Reviewed prior Xrays  - Reviewed Prior MRI  - Reviewed CBC  -Reviewed CMP  - Reviewed HgA1C    3)  Discussed minor procedure and procedure risk    Rhetta Mura, DO  Interventional Spine  [facility]  Dept. Of Orthopedic Surgery

## 2020-10-15 ENCOUNTER — Ambulatory Visit: Payer: BLUE CROSS/BLUE SHIELD

## 2020-10-15 ENCOUNTER — Telehealth: Payer: BLUE CROSS/BLUE SHIELD

## 2020-10-15 NOTE — Telephone Encounter
Call Back Request      Reason for call back:  Patient called back regarding message below, turn around time was provided. Please call patient back, thank you.    Any Symptoms:  []  Yes  [x]  No       If yes, what symptoms are you experiencing:    o Duration of symptoms (how long):    o Have you taken medication for symptoms (OTC or Rx):      Patient or caller has been notified of the 24-48 hour turnaround time.

## 2020-10-15 NOTE — Telephone Encounter
Call Back Request      Reason for call back: Patient would like a call back to schedule an appointment for cervical injection with Dr.Solberg. Please assist. Thank you    Any Symptoms:  []  Yes  [x]  No       If yes, what symptoms are you experiencing:    o Duration of symptoms (how long):    o Have you taken medication for symptoms (OTC or Rx):      Patient or caller has been notified of the 24-48 hour turnaround time.

## 2020-10-15 NOTE — Telephone Encounter
Forwarded by: Hoyle Sauer [place]    Injection schedule request.    Cp

## 2020-10-16 ENCOUNTER — Ambulatory Visit: Payer: PRIVATE HEALTH INSURANCE

## 2020-10-18 ENCOUNTER — Ambulatory Visit: Payer: BLUE CROSS/BLUE SHIELD

## 2020-10-18 NOTE — Telephone Encounter
Reply by: Raelene Bott  Patient communication via  mychart. Thank You

## 2020-10-18 NOTE — Telephone Encounter
Forwarded by: Hoyle Sauer [place]  Injection request.    Cp

## 2020-10-19 ENCOUNTER — Inpatient Hospital Stay: Payer: PRIVATE HEALTH INSURANCE

## 2020-10-19 ENCOUNTER — Telehealth: Payer: BLUE CROSS/BLUE SHIELD

## 2020-10-19 NOTE — Telephone Encounter
Hi Dr. Lenard Galloway,    I called and spoke with patient regarding her surgery date. I explained that June 7th was no loner available but I would try to find sooner time for her if she wishes. I am also going to prepare the letter she needs stating she is having surgery in July so her family can fly in from out of state to help her after surgery.    Thank you,  Gertrude Tarbet

## 2020-10-19 NOTE — Telephone Encounter
Reply by: Sande Brothers  Emailed letter to patient.

## 2020-10-19 NOTE — Telephone Encounter
Call Back Request      Reason for call back:     Patient requesting call back states she still has not found the letter       Any Symptoms:  []  Yes  []  No       If yes, what symptoms are you experiencing:    o Duration of symptoms (how long):    o Have you taken medication for symptoms (OTC or Rx):      Patient or caller has been notified of the 24-48 hour turnaround time.

## 2020-10-19 NOTE — Telephone Encounter
If she is taking ASA 81mg  she should be off for 7 days prior to procedure and can restart 24 hours after.  She might not be taking this medication.  If she is not taking are we able to get her on schedule for tomorrow, what about downtown if she can travel there?        Thank you,  Wille Glaser

## 2020-10-19 NOTE — Addendum Note
Addended by: Raelene Bott on: 10/19/2020 07:41 AM     Modules accepted: Orders

## 2020-10-22 ENCOUNTER — Inpatient Hospital Stay: Payer: BLUE CROSS/BLUE SHIELD

## 2020-10-22 ENCOUNTER — Inpatient Hospital Stay: Payer: PRIVATE HEALTH INSURANCE

## 2020-10-22 DIAGNOSIS — Z719 Counseling, unspecified: Secondary | ICD-10-CM

## 2020-10-25 ENCOUNTER — Telehealth: Payer: BLUE CROSS/BLUE SHIELD

## 2020-10-25 ENCOUNTER — Telehealth: Payer: MEDICAID

## 2020-10-25 ENCOUNTER — Telehealth: Payer: MEDICAID | Attending: Neurology

## 2020-10-25 DIAGNOSIS — R9089 Other abnormal findings on diagnostic imaging of central nervous system: Secondary | ICD-10-CM

## 2020-10-25 DIAGNOSIS — R519 Nonintractable headache, unspecified chronicity pattern, unspecified headache type: Secondary | ICD-10-CM

## 2020-10-25 NOTE — Telephone Encounter
Spoke with patient, scheduled VV for 11/01/2020

## 2020-10-25 NOTE — Telephone Encounter
Contacted patient to schedule next avail. follow up appointment. Lvm requesting call back

## 2020-10-25 NOTE — Telephone Encounter
Call Back Request      Reason for call back: Pt wants to know when she can see Dr. Brett Albino again. Pt said she can get the MRI as early as May 12. Please call pt back.      Any Symptoms:  []  Yes  [x]  No       If yes, what symptoms are you experiencing:    o Duration of symptoms (how long):    o Have you taken medication for symptoms (OTC or Rx):      Patient or caller has been notified of the 24-48 hour turnaround time.

## 2020-10-25 NOTE — Telephone Encounter
Contacted patient to schedule follow up appointment. Lvm requesting call back  Reply by: Nita Sickle

## 2020-10-25 NOTE — Telephone Encounter
Call Back Request      Reason for call back: Pt returned call and was scheduled for the next available in-person appt. Pt is requesting to be seen sooner to discuss Dr. Velia Meyer findings on the MRI that was ordered by her PCP, Dr. Minna Antis. Please advise if sooner appt could be accommodated. Thank you.    CBN: [phone]      Any Symptoms:  []  Yes  [x]  No       If yes, what symptoms are you experiencing:    o Duration of symptoms (how long):    o Have you taken medication for symptoms (OTC or Rx):      Patient or caller has been notified of the 24-48 hour turnaround time.

## 2020-10-25 NOTE — Consults
INITIAL NEUROLOGY OUTPATIENT CONSULTATION    VIDEO VISIT    10/25/2020      REFERRING PROVIDER PANG, Manuella Ghazi PROVIDER: Gildardo Cranker [facility])    REASON FOR CONSULT:  Headache  Abnormal MRI Brain    Patient Consent to Telehealth Questionnaire   [facility] TELEHEALTH PRECHECKIN QUESTIONS 10/25/2020   By clicking ''I Agree'', I consent to the below:  I Agree     - I agree  to be treated via a video visit and acknowledge that I may be liable for any relevant copays or coinsurance depending on my insurance plan.  - I understand that this video visit is offered for my convenience and I am able to cancel and reschedule for an in-person appointment if I desire.  - I also acknowledge that sensitive medical information may be discussed during this video visit appointment and that it is my responsibility to locate myself in a location that ensures privacy to my own level of comfort.  - I also acknowledge that I should not be participating in a video visit in a way that could cause danger to myself or to those around me (such as driving or walking).  If my provider is concerned about my safety, I understand that they have the right to terminate the visit.           HISTORY OF PRESENT ILLNESS:  60 y.o. RH lady with myelopathy (Cervical), myoclonus, possible stroke, headache here for evaluation thereof     Has neck pain due to myelopathy related to spinal stenosis, pending surgery in 12/2020. Was having myoclonus of neck and arms, onset 2013 when in [place] in the setting of some shoulder pain, thought initially to be seizure, also had flu like symptoms, then movements resolved, then came back again in 2015 had another episode. Daughter felt it was due to stress. Both arms would move but no LOC and it was uncomfortable, and would have episodes on and off x 2-3 days like ''earthquakes'' and ''aftershocks''. Would occur rarely then resolved during 2019-2022 during pandemic, now have restarted. Would have them 2-3x year prior. She thinks maybe they restarted due to stress of upcoming neck surgery, having them briefly near daily now x 2 weeks.    Had a concussion she reports entering an access [name] 08/2019 which was the start of her neck issues she reports. Had EDI w temporary improvement. Denies neck issue or injury prior to that. Was using a cane due to falls x 2 years prior with dizziness and was on meclizine. Now has neck brace which has helped due to worsening pain with quick movements of neck. Feels myoclonic type movements worsened after that.    She also reports onset of headaches 08/2019 after concussion described as pulsating pain in back of head and front and sides, resolved for some time after 04/2020 but then again returned in 08/2020. She had had an EDI 07/2020 for neck after which time she felt she could move around better.  They again returned 08/19/20 after bending over to play w dog, got up and felt very dizzy and then headaches returned. She also had numbness in face on L and had MRI Brain which revealed possible evolving stroke. She is on asa and lipitor since then but not taking the lipitor. LDL 109.  She also reports OSA and uses CPAP. Does have a hx of headache in youth (age 60), used fiorinal. Current headaches have no vision changes, photophobia, phonophobia, or nausea. No  fam hx of headache. Headaches are better since April and May but she is taking tylenol every day for neck pain still.    Has dizziness when walking she reports, more like on a boat. Feels she leans when walking. No room spinning.    Reporting increased anxiety associated w myoclonus like movements.    Has 7 grandchildren and a great grandson.      REVIEW OF SYSTEMS: A 14 point review of systems was otherwise negative per patient inclusive of the following systems: allergy, dermatologic, endocrinologic, HEENT, ophthalmologic, neurologic, psychiatric, gastrointestinal, pulmonary, cardiac, urologic, musculoskeletal, hematologic, and constitutional. Patient Active Problem List   Diagnosis   ??? Diabetes mellitus (HCC/RAF)   ??? Abnormal cardiovascular stress test   ??? OSA (obstructive sleep apnea)   ??? Myelopathy concurrent with and due to spinal stenosis of cervical region (HCC/RAF)   ??? Hypertension, essential   ??? Hypothyroidism   ??? Morbid (severe) obesity due to excess calories (HCC/RAF)   ??? Candidate for statin therapy due to risk of future cardiovascular event   ??? Abnormal finding on MRI of brain   ??? Fibroid     Allergies   Allergen Reactions   ??? Iodinated Diagnostic Agents Anaphylaxis     Outpatient Medications Prior to Visit   Medication Sig   ??? aspirin 81 mg EC tablet Take 1 tablet (81 mg total) by mouth daily.   ??? atorvastatin 10 mg tablet Take 1 tablet (10 mg total) by mouth daily.   ??? ciclopirox 8% solution Apply topically at bedtime Apply to adjacent skin and affected nails daily. Remove with alcohol every 7 days. Use up to 48 weeks.Marland Kitchen   ??? clobetasol 0.05% ointment Apply topically two (2) times daily APPLY AND GENTLY MASSAGE INTO AFFECTED AREA(S) TWICE DAILY.   ??? clobetasol 0.05% ointment Apply topically two (2) times daily APPLY AND GENTLY MASSAGE INTO AFFECTED AREA(S) TWICE DAILY. Do not apply to face, armpit, or groin.Marland Kitchen   ??? diclofenac Sodium 1% gel Apply 4 g topically .   ??? diclofenac Sodium 1% gel apply 2 to 3 gram twice a day if needed   ??? fluconazole 150 mg tablet 1 tab PO x 1. May repeat after 72h if symptoms persist/worsen.   ??? gabapentin (NEURONTIN) 100 mg capsule Take 1 capsule (100 mg total) by mouth three (3) times daily.   ??? gabapentin 100 mg capsule .   ??? gabapentin 300 mg capsule take 1 capsule by mouth five times a day   ??? hydrocortisone 1% cream Apply topically.   ??? levothyroxine 100 mcg tablet take 1 tablet by mouth every morning before breakfast.   ??? medroxyPROGESTERone 10 mg tablet Take 1 tablet (10 mg total) by mouth daily.   ??? olmesartan-hydroCHLOROthiazide 40-25 mg tablet Take 0.5 tablets by mouth daily.     No facility-administered medications prior to visit.       Social History     Socioeconomic History   ??? Marital status: Single     Spouse name: Not on file   ??? Number of children: Not on file   ??? Years of education: Not on file   ??? Highest education level: Not on file   Occupational History   ??? Not on file   Tobacco Use   ??? Smoking status: Former Smoker     Packs/day: 0.25     Years: 5.00     Pack years: 1.25     Types: Cigarettes   ??? Smokeless tobacco: Never Used  Vaping Use   ??? Vaping Use: Never used   Substance and Sexual Activity   ??? Alcohol use: Not Currently   ??? Drug use: Not Currently   ??? Sexual activity: Not on file   Other Topics Concern   ??? Not on file   Social History Narrative   ??? Not on file     Social Determinants of Health     Financial Resource Strain: Not on file   Physical Activity: Not on file   Stress: Not on file     No family history on file.    There were no vitals filed for this visit.  EXAM  General: No apparent distress, NCAT, Awake  HEENT: anicteric sclera clear oropharynx  Extremities: no clubbing cyanosis or edema    NEURO  Mental Status: AOx4, follows commands,   Fluent language with comprehension, repetition and naming intact    Cranial Nerves: EOMI, face symmetric to eye closure and symmetric smile, palate elevates symmetrically, tongue is midline without atrophy  Shoulder shrug limited by pain bilaterally slightly more on L.    Motor: neck brace in place.  Tremor - none  Fine Motor - intact to fingertaps  Tone - UTA  Bulk - normal  No drift    Reflexes:   UTA    Coordination: intact Finger to Nose    Sensory UTA    Gait:  Good casual gait w axial stiffness in terms of posture due to neck brace in place  Otherwise symmetric arm swing, good step length foot clearance, base and turn      IMAGING  MRI/A Brain /Neck 08/2020    MRI BRAIN:   1.  No acute infarct or intracranial hemorrhage.  2.  Nonspecific FLAIR hyperintensity within the posterior insular cortex, which may represent evolving subacute to chronic infarct.  Consider 1 month follow-up to assess temporal change.  ???  MRA BRAIN: No proximal large vessel occlusion or high grade stenosis.  ???  MRA NECK: No occlusion or high grade stenosis.     MRI C spine 06/2020    ???  There are multilevel degenerative changes throughout cervical spine as described, greatest at C5-C6, where there is is posterior disc protrusion resulting in severe central canal stenosis with indentation of ventral cord. There is mild patchy T2 hyperintense signal within the cord at this level which may represent edema or myelomalacia. There are also moderate bilateral foraminal stenosis at this level.  ???  MRI Brain 12/2019 [facility]    Mild Microvascular ischemic gliosis.     Otherwise unremarkable MRI the brain.     MRI C spine 09/2019 [facility]    1.  Multilevel degenerative changes most pronounced at C5-C6 where there is trace   retrolisthesis with a central disk/osteophyte protrusion impinging the spinal cord with   severe spinal canal stenosis. There is mild facet arthropathy and bilateral   uncovertebral hypertrophy causing severe bilateral foraminal stenosis. This affects the exiting   bilateral C6 nerve roots. This level is unchanged.     2. Reversal of the cervical lordosis suggesting muscle spasm and/or degenerative   changes.     1.  Multilevel degenerative changes most pronounced at C5-C6 where there is trace   retrolisthesis with a central disk/osteophyte protrusion impinging the spinal cord with   severe spinal canal stenosis. There is mild facet arthropathy and bilateral   uncovertebral hypertrophy causing severe bilateral foraminal stenosis. This affects the exiting   bilateral C6 nerve roots.  This level is unchanged.     2. Reversal of the cervical lordosis suggesting muscle spasm and/or degenerative   changes.       IMPRESSION AND RECOMMENDATIONS  60 y.o. RH lady with myelopathy (Cervical), myoclonus, possible stroke, headache, poor balance and anxiety.    - For possible insular ischemic stroke, cont asa, encouraged at least 10 if not 80mg  lipitor for goal LDL 70. For secondary stroke prevention goal LDL< 70, Goal BP 120/80, Goal A1c<5.7. Recommend low glucose low carbohydrate Mediterranean diet and exercise. She will FU w repeat MRI Brain as well.    - headaches have now resolved. May have been tension vs migraine w prior post concussive component.    - dizziness/poor balance - may have a component of neuropathy suggest FU in person given description. A1c 6.6. consider neuropathy labs after sensory examination if indicated.    - Cervical spine disease; myelomalacia 2/2 canal stenosis w myoclonus preceding report of neck pain. Reevaluate myoclonus after surgery. PT per neurosurgery    - anxiety FU c PMD/Psych    65 minutes were spent personally by me today on this encounter which include today's pre-visit review of the chart, obtaining appropriate history, performing an evaluation, documentation and discussion of management with details supported within the note for today's visit. The time documented was exclusive of any time spent on the separately billed procedure.      Clair Alfieri C. Amely Voorheis

## 2020-10-26 ENCOUNTER — Ambulatory Visit: Payer: MEDICAID

## 2020-10-27 ENCOUNTER — Inpatient Hospital Stay: Payer: BLUE CROSS/BLUE SHIELD

## 2020-10-27 ENCOUNTER — Ambulatory Visit: Payer: MEDICAID

## 2020-10-27 DIAGNOSIS — R9089 Other abnormal findings on diagnostic imaging of central nervous system: Secondary | ICD-10-CM

## 2020-10-28 ENCOUNTER — Ambulatory Visit: Payer: PRIVATE HEALTH INSURANCE

## 2020-10-28 ENCOUNTER — Telehealth: Payer: MEDICARE

## 2020-10-28 DIAGNOSIS — D219 Benign neoplasm of connective and other soft tissue, unspecified: Secondary | ICD-10-CM

## 2020-10-28 DIAGNOSIS — N950 Postmenopausal bleeding: Secondary | ICD-10-CM

## 2020-10-28 MED ORDER — MISOPROSTOL 200 MCG PO TABS
ORAL_TABLET | 0 refills | Status: AC
Start: 2020-10-28 — End: ?

## 2020-10-28 MED ORDER — LORAZEPAM 0.5 MG PO TABS
ORAL_TABLET | 0 refills | Status: AC
Start: 2020-10-28 — End: ?

## 2020-10-28 NOTE — Progress Notes
Patient Consent to Telehealth Questionnaire   [facility] TELEHEALTH PRECHECKIN QUESTIONS 10/25/2020   By clicking ''I Agree'', I consent to the below:  I Agree     - I agree  to be treated via a video visit and acknowledge that I may be liable for any relevant copays or coinsurance depending on my insurance plan.  - I understand that this video visit is offered for my convenience and I am able to cancel and reschedule for an in-person appointment if I desire.  - I also acknowledge that sensitive medical information may be discussed during this video visit appointment and that it is my responsibility to locate myself in a location that ensures privacy to my own level of comfort.  - I also acknowledge that I should not be participating in a video visit in a way that could cause danger to myself or to those around me (such as driving or walking).  If my provider is concerned about my safety, I understand that they have the right to terminate the visit.         Gynecology Telehealth Progress Note    CC: discuss results    Subjective   Patient has noticed a streak of bright red blood on the toilet paper when wiping after voiding, 5-6 times in the past 2 months. She also reports one occasion during which she sat on her bed after showering and left a streak of blood on the bed sheet. O/w no new gynecologic complaints. Denies F/C, abdominal/pelvic pain, GI/urinary sx. Denies changes in medical/surgical/social/family hx since last visit on 08/31/20.    Objective   PE:  General: WDWN female in NAD  Neurologic/Psychiatric: A&O x 4, mood/affect appropriate    Labs:  09/07/20  CBC 8.78/13.5/43.2/269  Hgb A1c 6.6  TSH 1.6    Imaging:  MMG (10/20/20): BI-RADS 2  Pelvic MRI (10/12/20): Uterus 7.6 x 3.8 x 5.2 cm. Approximately 4-5 fibroids, including 5.5 cm FIGO 6 in R fundus, 2.2 cm FIGO 5 fibroid in anterior body, and 1 cm FIGO 4 fibroid in L fundus. C/D scar present. JZ, endometrium, and ovaries WNL. No free fluid.    Pathology:  Pap (08/16/20): NILM HPV neg  EMB (10/01/17; OSR obtained, scanned into [facility], and reviewed): mostly clotted blood with few benign endocervical glands and scant superficial endometrial lining with naked glands, no e/o atypia or malignancy, no endometrial stroma or deep endometrial glands present for complete endometrial assessment  EMB (05/26/19; OSR obtained, scanned into [facility], and reviewed): unremarkable weekly proliferative to inactive endometrial fragments; negative for inflammation, hyperplasia, or malignancy    Assessment & Plan   60 y.o. PMP [id number] with PMB and fibroids.  - Pelvic MRI report reviewed in [facility] and images reviewed in PACS. All results reviewed with patient, including actual images via video.  - Discussed that fibroids, also called leiomyomas or myomas, are common benign growths that develop from the muscular tissue of the uterus and do not require treatment unless they cause bothersome symptoms. Discussed that fibroids can cause abnormal uterine bleeding (with or without anemia), pelvic pressure and pain (caused by uterine bulk and, occasionally, leiomyoma degeneration), and urinary and gastrointestinal symptoms (e.g., urinary frequency, incontinence, hydronephrosis, and/or constipation resulting from uterine mass effect on the urinary and gastrointestinal systems). Discussed that only fibroids that contact the endometrial cavity (FIGO types 0, 1, 2, and 3) cause AUB.  - Discussed options of expectant management, medical management, Interventional Radiology (IR) procedures, and surgical management with myomectomy versus hysterectomy.  Discussed the benefits and risks of each option. Patient is interested in UFE. IR referral placed in [facility] and written information provided via MyChart message.  - Patient is s/p benign EMB x 2 in 2019 and 2020 with an outside provider (OSR reviewed in [facility]). Recommended a repeat EMB at this time to provide an updated evaluation for endometrial hyperplasia/malignancy prior to UFE. Patient agrees but endorses anxiety about procedural pain. Will medicate with lorazepam 0.5-1.0 mg PO x 1 (e-rx'd to patient's confirmed preferred pharmacy), ketorolac 30 mg IM x 1, and paracervical block prior to procedure. Will also prepare cervix with misoprostol 400 mcg buccal 2-4h before procedure (e-rx'd to patient's confirmed preferred pharmacy). Of note, patient is scheduled for spinal surgery on 01/05/21. Per patient's neurosurgeon Dr. Lenna Gilford, gynecologic procedures (clean-contaminated) should not be performed within 4-6 weeks of spine instrumentation, so will ensure EMB is done >6 weeks prior to her scheduled surgery.  - Patient verbalizes understanding of all of the above. All questions were answered to her satisfaction.  - EMB overbooked for Monday 11/08/20 at 7:30 am. Will also draw LD isoenzyme panel at that time.  - Discussed return precautions.    I personally spent a total of >40 minutes on the date of this encounter, including both face-to-face and non-face-to-face time used for preparing to see the patient, including review of tests; obtaining and/or reviewing patient history; performing a medically-appropriate examination and/or evaluation; counseling and educating the patient, family, and/or caregiver; ordering medications, tests, and/or procedures; referring to and/or communicating with other healthcare professionals; documenting clinical information in the electronic medical record; interpreting and communicating results to the patient, family, and/or caregiver; and coordinating care.    Melony Tenpas D. Tomaz Janis, MD  10/28/2020  4:04 PM

## 2020-11-01 ENCOUNTER — Institutional Professional Consult (permissible substitution): Payer: MEDICAID

## 2020-11-01 ENCOUNTER — Telehealth: Payer: MEDICARE | Attending: Neurology

## 2020-11-01 ENCOUNTER — Ambulatory Visit: Payer: BLUE CROSS/BLUE SHIELD

## 2020-11-01 DIAGNOSIS — M4722 Other spondylosis with radiculopathy, cervical region: Secondary | ICD-10-CM

## 2020-11-01 DIAGNOSIS — G549 Nerve root and plexus disorder, unspecified: Secondary | ICD-10-CM

## 2020-11-01 DIAGNOSIS — M4712 Other spondylosis with myelopathy, cervical region: Secondary | ICD-10-CM

## 2020-11-01 NOTE — Progress Notes
NEUROLOGY OUTPATIENT CONSULTATION    VIDEO VISIT    11/01/2020      REFERRING PROVIDER PANG, Manuella Ghazi PROVIDER: Gildardo Cranker [facility])    REASON FOR CONSULT:  Headache  Abnormal MRI Brain    Patient Consent to Telehealth Questionnaire   Chelsea [name] TELEHEALTH PRECHECKIN QUESTIONS 10/31/2020   By clicking ''I Agree'', I consent to the below:  I Agree     - I agree  to be treated via a video visit and acknowledge that I may be liable for any relevant copays or coinsurance depending on my insurance plan.  - I understand that this video visit is offered for my convenience and I am able to cancel and reschedule for an in-person appointment if I desire.  - I also acknowledge that sensitive medical information may be discussed during this video visit appointment and that it is my responsibility to locate myself in a location that ensures privacy to my own level of comfort.  - I also acknowledge that I should not be participating in a video visit in a way that could cause danger to myself or to those around me (such as driving or walking).  If my provider is concerned about my safety, I understand that they have the right to terminate the visit.           HISTORY OF PRESENT ILLNESS:  60 y.o. RH lady with myelopathy (Cervical), myoclonus, possible stroke, headache here for evaluation thereof     Has neck pain due to myelopathy related to spinal stenosis, pending surgery in 12/2020. Was having myoclonus of neck and arms, onset 2013 when in [place] in the setting of some shoulder pain, thought initially to be seizure, also had flu like symptoms, then movements resolved, then came back again in 2015 had another episode. Daughter felt it was due to stress. Both arms would move but no LOC and it was uncomfortable, and would have episodes on and off x 2-3 days like ''earthquakes'' and ''aftershocks''. Would occur rarely then resolved during 2019-2022 during pandemic, now have restarted. Would have them 2-3x year prior. She thinks maybe they restarted due to stress of upcoming neck surgery, having them briefly near daily now x 2 weeks.    Had a concussion she reports entering an access [name] 08/2019 which was the start of her neck issues she reports. Had EDI w temporary improvement. Denies neck issue or injury prior to that. Was using a cane due to falls x 2 years prior with dizziness and was on meclizine. Now has neck brace which has helped due to worsening pain with quick movements of neck. Feels myoclonic type movements worsened after that.    She also reports onset of headaches 08/2019 after concussion described as pulsating pain in back of head and front and sides, resolved for some time after 04/2020 but then again returned in 08/2020. She had had an EDI 07/2020 for neck after which time she felt she could move around better.  They again returned 08/19/20 after bending over to play w dog, got up and felt very dizzy and then headaches returned. She also had numbness in face on L and had MRI Brain which revealed possible evolving stroke. She is on asa and lipitor since then but not taking the lipitor. LDL 109.  She also reports OSA and uses CPAP. Does have a hx of headache in youth (age 67), used fiorinal. Current headaches have no vision changes, photophobia, phonophobia, or nausea. No fam  hx of headache. Headaches are better since April and May but she is taking tylenol every day for neck pain still.    Has dizziness when walking she reports, more like on a boat. Feels she leans when walking. No room spinning.    Reporting increased anxiety associated w myoclonus like movements.    Has 7 grandchildren and a great grandson.    INTERVAL HISTORY  Still having pain, worsening in hips, feet and hands going numb as well.  Has surgery pending in July 2022.    MRI Brain repeat showed no great change from prior.    No ability to suppress movements and no relief.    No lightheadedness or dizziness since using meclizine. No interval falls.    Starting PT soon.    Was previously on zoloft and ativan for anxiety. Never on antipsychotics she reports.    Has swaying when standing she reports without ability to control it.    REVIEW OF SYSTEMS: A 14 point review of systems was otherwise negative per patient inclusive of the following systems: allergy, dermatologic, endocrinologic, HEENT, ophthalmologic, neurologic, psychiatric, gastrointestinal, pulmonary, cardiac, urologic, musculoskeletal, hematologic, and constitutional.       Patient Active Problem List   Diagnosis   ? Diabetes mellitus (HCC/RAF)   ? Abnormal cardiovascular stress test   ? OSA (obstructive sleep apnea)   ? Myelopathy concurrent with and due to spinal stenosis of cervical region (HCC/RAF)   ? Hypertension, essential   ? Hypothyroidism   ? Morbid (severe) obesity due to excess calories (HCC/RAF)   ? Candidate for statin therapy due to risk of future cardiovascular event   ? Abnormal finding on MRI of brain   ? Fibroid     Allergies   Allergen Reactions   ? Iodinated Diagnostic Agents Anaphylaxis     Outpatient Medications Prior to Visit   Medication Sig   ? aspirin 81 mg EC tablet Take 1 tablet (81 mg total) by mouth daily.   ? atorvastatin 10 mg tablet Take 1 tablet (10 mg total) by mouth daily.   ? ciclopirox 8% solution Apply topically at bedtime Apply to adjacent skin and affected nails daily. Remove with alcohol every 7 days. Use up to 48 weeks..   ? clobetasol 0.05% ointment Apply topically two (2) times daily APPLY AND GENTLY MASSAGE INTO AFFECTED AREA(S) TWICE DAILY.   ? clobetasol 0.05% ointment Apply topically two (2) times daily APPLY AND GENTLY MASSAGE INTO AFFECTED AREA(S) TWICE DAILY. Do not apply to face, armpit, or groin..   ? diclofenac Sodium 1% gel Apply 4 g topically .   ? diclofenac Sodium 1% gel apply 2 to 3 gram twice a day if needed   ? fluconazole 150 mg tablet 1 tab PO x 1. May repeat after 72h if symptoms persist/worsen.   ? gabapentin (NEURONTIN) 100 mg capsule Take 1 capsule (100 mg total) by mouth three (3) times daily.   ? gabapentin 100 mg capsule .   ? gabapentin 300 mg capsule take 1 capsule by mouth five times a day   ? hydrocortisone 1% cream Apply topically.   ? levothyroxine 100 mcg tablet take 1 tablet by mouth every morning before breakfast.   ? LORazepam 0.5 mg tablet 1-2 tabs PO 30 minutes before your procedure.   ? medroxyPROGESTERone 10 mg tablet Take 1 tablet (10 mg total) by mouth daily.   ? miSOPROStol 200 mcg tablet Place 1 tablet in each cheek of the mouth (400 mcg)  2-4 hours before your procedure and let them dissolve; do not swallow whole.   ? olmesartan-hydroCHLOROthiazide 40-25 mg tablet Take 0.5 tablets by mouth daily.     No facility-administered medications prior to visit.       Social History     Socioeconomic History   ? Marital status: Single   Tobacco Use   ? Smoking status: Former Smoker     Packs/day: 0.25     Years: 5.00     Pack years: 1.25     Types: Cigarettes   ? Smokeless tobacco: Never Used   Vaping Use   ? Vaping Use: Never used   Substance and Sexual Activity   ? Alcohol use: Not Currently   ? Drug use: Not Currently     No family history on file.    There were no vitals filed for this visit.  EXAM  General: No apparent distress, NCAT, Awake  HEENT: anicteric sclera clear oropharynx  Extremities: no clubbing cyanosis or edema    NEURO  Mental Status: AOx4, follows commands,   Fluent language with comprehension, repetition and naming intact    Cranial Nerves: EOMI, face symmetric to eye closure and symmetric smile, palate elevates symmetrically, tongue is midline without atrophy  Shoulder shrug limited by pain bilaterally slightly more on L.    Motor: neck brace in place.  Tremor - none  Fine Motor - intact to fingertaps  Tone - UTA  Bulk - normal  No drift    Reflexes:   UTA    Coordination: intact Finger to Nose    Sensory UTA    Gait:  Good casual gait w axial stiffness in terms of posture due to neck brace in place  Otherwise symmetric arm swing, good step length foot clearance, base and turn      IMAGING  MRI Brain  10/27/20    No acute infarct or intracranial hemorrhage.  Nonspecific FLAIR hyperintensity within the posterior insular cortex, without interval progression. The overall appearance is grossly unchanged from the prior study.   ?    MRI/A Brain [facility] 08/2020    MRI BRAIN:   1.  No acute infarct or intracranial hemorrhage.  2.  Nonspecific FLAIR hyperintensity within the posterior insular cortex, which may represent evolving subacute to chronic infarct.  Consider 1 month follow-up to assess temporal change.  ?  MRA BRAIN: No proximal large vessel occlusion or high grade stenosis.  ?  MRA NECK: No occlusion or high grade stenosis.     MRI C spine 06/2020    ?  There are multilevel degenerative changes throughout cervical spine as described, greatest at C5-C6, where there is is posterior disc protrusion resulting in severe central canal stenosis with indentation of ventral cord. There is mild patchy T2 hyperintense signal within the cord at this level which may represent edema or myelomalacia. There are also moderate bilateral foraminal stenosis at this level.  ?  MRI Brain 12/2019 [facility]    Mild Microvascular ischemic gliosis.     Otherwise unremarkable MRI the brain.     MRI C spine 09/2019 [facility]    1.  Multilevel degenerative changes most pronounced at C5-C6 where there is trace   retrolisthesis with a central disk/osteophyte protrusion impinging the spinal cord with   severe spinal canal stenosis. There is mild facet arthropathy and bilateral   uncovertebral hypertrophy causing severe bilateral foraminal stenosis. This affects the exiting   bilateral C6 nerve roots. This level is  unchanged.     2. Reversal of the cervical lordosis suggesting muscle spasm and/or degenerative   changes.     1.  Multilevel degenerative changes most pronounced at C5-C6 where there is trace   retrolisthesis with a central disk/osteophyte protrusion impinging the spinal cord with   severe spinal canal stenosis. There is mild facet arthropathy and bilateral   uncovertebral hypertrophy causing severe bilateral foraminal stenosis. This affects the exiting   bilateral C6 nerve roots. This level is unchanged.     2. Reversal of the cervical lordosis suggesting muscle spasm and/or degenerative   changes.       IMPRESSION AND RECOMMENDATIONS  60 y.o. RH lady with myelopathy (Cervical), myoclonus, possible stroke, headache, poor balance and anxiety.    - For possible insular ischemic stroke, cont asa, encouraged at least 10 if not 80mg  lipitor for goal LDL 70. For secondary stroke prevention goal LDL< 70, Goal BP 120/80, Goal A1c<5.7. Recommend low glucose low carbohydrate Mediterranean diet and exercise. Consider FU w repeat MRI Brain as well in 6-33mo. Consider TTE and ziopatch w PMD or cardiology for full workup.    - headaches have now resolved. May have been tension vs migraine w prior post concussive component.    - dizziness/poor balance - may have a component of neuropathy suggest FU in person given description. A1c 6.6. consider neuropathy labs after sensory examination if indicated. Lately improved w meclizine she reports.    - Cervical spine disease; myelomalacia 2/2 canal stenosis w possible myoclonus preceding report of neck pain. Reevaluate myoclonus after surgery, may alternatively be tic disorder given association w stress though no other characteristics thereof per history. PT per neurosurgery. Daughter will send video.    - anxiety FU c PMD/Psych    40 minutes were spent personally by me today on this encounter which include today's pre-visit review of the chart, obtaining appropriate history, performing an evaluation, documentation and discussion of management with details supported within the note for today's visit. The time documented was exclusive of any time spent on the separately billed procedure.      Krisa Blattner C. Noa Constante

## 2020-11-02 LAB — COVID-19 PCR/TMA: COVID-19 PCR/TMA: NOT DETECTED

## 2020-11-02 NOTE — Telephone Encounter
PDL Call to Practice    Reason for Call:Pt called in and would like to speak with our office about procedure tomorrow.I called PDL line and spoke with Bree and she will have one of the assistants reach pt by today.Pt understood and will await call.Thank you.    Appointment Related?  []  Yes  [x]  No     If yes;  Date:  Time:    Call warm transferred to PDL: [x]  Yes  []  No    Call Received by Practice Representative:Bree

## 2020-11-02 NOTE — Telephone Encounter
Spoke with patient, all questions answered regarding local anesthetic and time of procedure . Thank You

## 2020-11-02 NOTE — Telephone Encounter
Forwarded by: Valerie Fredin [place]

## 2020-11-03 ENCOUNTER — Ambulatory Visit: Payer: MEDICARE

## 2020-11-03 LAB — Glucose,POC: GLUCOSE,POC: 105 mg/dL — ABNORMAL HIGH (ref 65–99)

## 2020-11-03 MED ADMIN — LIDOCAINE HCL (PF) 1 % IJ SOLN: @ 20:00:00 | Stop: 2020-11-04 | NDC 55150016205

## 2020-11-03 MED ADMIN — SODIUM CHLORIDE (PF) 0.9 % INJ SOLN 10 ML VIAL (BULK CHARGE): @ 20:00:00 | Stop: 2020-11-04 | NDC 63323018610

## 2020-11-03 MED ADMIN — BETAMETHASONE SOD PHOS & ACET 6 (3-3) MG/ML IJ SUSP: @ 20:00:00 | Stop: 2020-11-04 | NDC 00517072001

## 2020-11-03 MED ADMIN — GADOBUTROL 1 MMOL/ML IV SOLN: @ 20:00:00 | Stop: 2020-11-04 | NDC 50419032511

## 2020-11-03 NOTE — Discharge Instructions
POST PROCEDURE INSTRUCTIONS      Follow up with Dr. Jacquel Mccamish's Office in two weeks to follow-up on the success of the injection that was done today.      The effects of the medications may take 5-10 days to take full effect; you may be sore until then.    A medication was given at the time of the block that may make you numb.  When the numbness wears off, the pain may return.  This is normal.    If pain at injection site, please apply ice to area for 20 minutes up to 5 times a day.    Gradually increase activity as tolerated.    For headaches, drink fluids and take caffeine. Headaches can make you feel terrible, but are usually not dangerous.  If headache continues for more than 48 hours please contact Dr. Clorinda Wyble?s office    If fever greater than 100.5 degrees F or wound infection call Dr. Sherif Millspaugh's Office.    For the next two days avoid heavy lifting, but continue to perform your stretching and exercise program as outlined by your Physical Therapist.  If you do not have a stretching program at this time, you will be instructed by your Physical Therapist at a scheduled appointment.    If a repeat injection is indicated, we will discuss if this at follow up visit in 2 weeks.      For any complications or issues after hours please contact the page operator and ask for the resident on call for Dr. Anothy Bufano: [phone]    If any problems or to reschedule procedures please call Dr. Jeffrey Graefe's office:   For Sofie [phone]     For follow up appointments: [phone]

## 2020-11-03 NOTE — Op Note
PREOPERATIVE DIAGNOSIS:  cervical radiculopathy    POSTOPERATIVE DIAGNOSIS:  cervical radiculopathy    Physician: Rhetta Mura, DO    PROCEDURE:  1) C7-T1 cervical interlaminar epidural steroid injection  2) fluoroscopic guidance.    INDICATIONS:  Neck pain/arm pain    TYPE OF ANESTHESIA:  Local 1% lidocaine    CONSENT FOR THE PROCEDURES:  A signed informed consent was obtained, and the risks, benefits, and alternatives were discussed with the patient.  They include but are not limited to bleeding, infection, pain at the site of injection, minimal effectiveness of the procedures, nerve damage, weakness, and numbness in the extremities, and worsening spine pain.  Risks of chronic steroid exposure explained clearly to patient, including, but not limited to: osteoporosis, adrenal insufficiency, increased blood pressure, increased blood sugars, avascular necrosis, and weight gain.  Patient expresses understanding of these risks and consents to procedure. The patient agreed to proceed with the procedures.    DESCRIPTION OF THE PROCEDURES IN DETAIL:  The patient was brought into the procedure room and placed in the prone position.  The skin was prepped and draped in a normal sterile fashion using chlorhexidine.  The skin between the seventh cervical and first thoracic interspace was identified using AP fluoroscopic guidance.  The skin overlying the region was anesthetized with a total of 3 mL of 1% lidocaine using a 25-gauge, half-inch needle.    Once the skin was anesthetized, a 20-gauge 3.5 inch Tuohy needle was then advanced using intermittent fluoroscopic guidance in the AP and contralateral oblique views and the loss of resistance technique in a midline approach.  Once a loss of resistance was achieved, and after negative aspiration for heme and cerebrospinal fluid, 1 mL of gadavist contrast was then injected under live fluoroscopy, which showed good epidural spread without intrathecal or intravascular uptake. Then a solution containing 2cc of betamethasone 5mg /mL and 1cc of preservative free normal saline was injected.  The needle was withdrawn using 0.5 mL of sterile 1% lidocaine flush as to avoid a steroid track.  The site then was dressed and a bandage was applied.  The patient tolerated the procedure well without complications and was discharged in stable condition.  Post-procedure instructions were given.    ESTIMATED BLOOD LOSS:  Minimal.    COMPLICATIONS:  None    Rhetta Mura

## 2020-11-05 ENCOUNTER — Ambulatory Visit: Payer: PRIVATE HEALTH INSURANCE

## 2020-11-08 ENCOUNTER — Telehealth: Payer: PRIVATE HEALTH INSURANCE

## 2020-11-08 ENCOUNTER — Ambulatory Visit: Payer: BLUE CROSS/BLUE SHIELD

## 2020-11-08 DIAGNOSIS — N950 Postmenopausal bleeding: Secondary | ICD-10-CM

## 2020-11-08 NOTE — Progress Notes
Gynecology Outpatient Progress Note    CC: EMB    Subjective   No new complaints. Denies changes in medical/surgical/social/family hx since last visit on 10/28/20.    Objective   VS: BP 138/82  ~ Pulse 57  ~ Wt (!) 258 lb 6.4 oz (117.2 kg)  ~ BMI 47.26 kg/m???     PE:  General: WDWN female in NAD  Abdomen: +BS, soft, NTTP, ND, no hepatosplenomegaly, no palpable masses/hernias  Lymphatic: no inguinal LAD  Neurologic/Psychiatric: A&O x 4, mood/affect appropriate  Pelvic:  - External genitalia: normal in appearance, no skin changes, no lesions/masses  - Urethral meatus: normal in location and appearance  - Vagina: moist/pink/rugated, no lesions/masses, small to moderate amount of blood in vault  - Cervix: no lesions/masses  - Anus/perineum: no skin changes, no lesions/masses    Assessment & Plan   60 y.o. PMP [id number] with PMB.  - EMB done. Cervical preparation performed with misoprostol 400 mcg buccal x 1 >4h before procedure. Premedication performed with lorazepam 0.5 mg PO x 1 (per patient request 2/2 anxiety), ibuprofen 600 mg PO x 1 (patient declined ketorolac), and paracervical block. Please see procedure note below.  - Discussed return precautions.  - Follow-up pending results.    EMB Procedure Note  Risks, benefits, and alternatives of the endometrial biopsy were discussed with the patient, including the risks of pain, infection, bleeding, and damage to surrounding structures including the risk of uterine perforation. The patient verbalized understanding and agreed to proceed. All questions were answered to her satisfaction and the consent form was signed. The patient was placed in the dorsal lithotomy position. The speculum was inserted into the vagina, providing excellent visualization of the cervix. The cervix was prepared with iodine x 3. A paracervical block was performed using 20 ml of 1% lidocaine. The single-toothed tenaculum was placed on the anterior lip of the cervix. The uterus sounded to 8 cm. The pipelle was inserted into the endometrial cavity. The EMB was performed x 3 passes. All instruments were removed from the vagina. Hemostasis was noted. The patient tolerated the procedure well.    Kelijah Towry D. Meriem Lemieux, MD  11/08/2020  9:10 AM

## 2020-11-08 NOTE — Telephone Encounter
Call Back Request      Reason for call back:     Pt will be 51min late to her appt today at 7:30am. Pt had issues w/transportation cab. Pt states Dr.Romeroso is aware of her situation and will accommodate  her for an appt later this morning. Pls call back. If no answer pls leave a msg .     Any Symptoms:  []  Yes  [x]  No       If yes, what symptoms are you experiencing:    o Duration of symptoms (how long):    o Have you taken medication for symptoms (OTC or Rx):      Patient or caller has been notified of the 24-48 hour turnaround time.

## 2020-11-09 ENCOUNTER — Telehealth: Payer: PRIVATE HEALTH INSURANCE

## 2020-11-09 ENCOUNTER — Ambulatory Visit: Payer: PRIVATE HEALTH INSURANCE

## 2020-11-09 DIAGNOSIS — R2689 Other abnormalities of gait and mobility: Secondary | ICD-10-CM

## 2020-11-09 NOTE — Progress Notes
RHBNOTE    Patient Consent to Telehealth   The patient agreed to participate in the video visit prior to joining the visit.        PHYSICAL THERAPY EVALUATION     PATIENT: Chelsea [name]  GENDER: female  AGE: 60 y.o.  DOB: 07-21-1960  MRN: [id number]    REFERRING PHYSICIAN: Clint Lipps, DO    DATE OF ONSET: 09/13/20    DIAGNOSIS:  Encounter Diagnoses   Name Primary?   ? Balance disorder      Subjective:   HISTORY OF PRESENT ILLNESS / INJURY: Chelsea [name] is a 60 y.o. female presents at physical therapy with complaint of balance issue and wobbling. Always have balance issue for about 5 years ago and started using a cane. She had fallen a few time in 2014 and 2015, so she started using a walker at some point.     When she stand still, she is putting her weight at the ball of the feet. She felt like she is going to fall backward when she stand on her heel. She felt she is wobbling when standing and walking. Use currently using cane and walker interchangeably, she used walker when she needs to walk more and being on her feet more.    On August 25, 2019, She hit her head when she was boarding an ''access'' Chelsea [name] and resulted in a concussion. She started noticed dizziness and headache since the injury. She has been taking medication from physician, the dizziness has stop, the headache also got better. She also has a lot neck pain after the concussion, scanned showed compressed spinal cord, her hands would be numb. Underwent cervical injection and pain got better, cervical surgery scheduled for July 2022 for fusion.     In March 2022, noticed still dizziness and light head when she gets up from petting her dog, went to emergency room and she is getting scan for her brain to rule out and clear her brain.    In April 2021, she misjudged a stop and fell backward.    CHIEF COMPLAINT:  Chief Complaint   Patient presents with   ? Balance Problem       PATIENT'S GOAL:   Improve balance     PAIN:   Location: neck cervical spine  Duration: Constant  Description: Aching and Sharp  Current Pain: did not rate  Pain at Best: did not rate  Pain at Worst: 10/10  Aggravating Factors: any neck movement  Alleviating Factors: wearing neck collar and injection  Pain Comments: numbness in bilateral hands    PRIOR INTERVENTIONS:   None    OCCUPATION:   Not working    LIVING SITUATION:   Lives alone    EXERCISE/SPORTS/ACTIVITIES:   none    PATIENT OWNED EQUIPMENT:   Surveyor, quantity and Single point cane    CURRENT LEVEL OF FUNCTION:   Difficulty and feel wobbling and unsteady with walking and standing   Ambulate with cane, walker or holding onto wall/furniture    PREVIOUS LEVEL OF FUNCTION:   Similar limitation started 5 years ago but less limited than now    HAND DOMINANCE:  Right     BARRIERS TO LEARNING:  Are there any cultural or religious beliefs limiting treatment?: No      Are there any barriers to learning?: No     Are there any special communication needs?: No             History:   PROBLEM SUMMARY  LIST:   Patient Active Problem List   Diagnosis   ? Diabetes mellitus (HCC/RAF)   ? Abnormal cardiovascular stress test   ? OSA (obstructive sleep apnea)   ? Myelopathy concurrent with and due to spinal stenosis of cervical region (HCC/RAF)   ? Hypertension, essential   ? Hypothyroidism   ? Morbid (severe) obesity due to excess calories (HCC/RAF)   ? Candidate for statin therapy due to risk of future cardiovascular event   ? Abnormal finding on MRI of brain   ? Fibroid   ? Balance disorder       SURGICAL HISTORY:  Past Surgical History:   Procedure Laterality Date   ? CESAREAN SECTION      x2   ? CHOLECYSTECTOMY  1987   ? INCISIONAL HERNIA REPAIR  1987        MEDICATIONS:   Outpatient Medications Prior to Visit   Medication Sig   ? aspirin 81 mg EC tablet Take 1 tablet (81 mg total) by mouth daily.   ? atorvastatin 10 mg tablet Take 1 tablet (10 mg total) by mouth daily.   ? ciclopirox 8% solution Apply topically at bedtime Apply to adjacent skin and affected nails daily. Remove with alcohol every 7 days. Use up to 48 weeks.Marland Kitchen   ??? clobetasol 0.05% ointment Apply topically two (2) times daily APPLY AND GENTLY MASSAGE INTO AFFECTED AREA(S) TWICE DAILY.   ??? clobetasol 0.05% ointment Apply topically two (2) times daily APPLY AND GENTLY MASSAGE INTO AFFECTED AREA(S) TWICE DAILY. Do not apply to face, armpit, or groin.Marland Kitchen   ??? diclofenac Sodium 1% gel Apply 4 g topically .   ??? diclofenac Sodium 1% gel apply 2 to 3 gram twice a day if needed   ??? fluconazole 150 mg tablet 1 tab PO x 1. May repeat after 72h if symptoms persist/worsen.   ??? gabapentin (NEURONTIN) 100 mg capsule Take 1 capsule (100 mg total) by mouth three (3) times daily.   ??? gabapentin 100 mg capsule .   ??? gabapentin 300 mg capsule take 1 capsule by mouth five times a day   ??? hydrocortisone 1% cream Apply topically.   ??? levothyroxine 100 mcg tablet take 1 tablet by mouth every morning before breakfast.   ??? LORazepam 0.5 mg tablet 1-2 tabs PO 30 minutes before your procedure.   ??? medroxyPROGESTERone 10 mg tablet Take 1 tablet (10 mg total) by mouth daily.   ??? miSOPROStol 200 mcg tablet Place 1 tablet in each cheek of the mouth (400 mcg) 2-4 hours before your procedure and let them dissolve; do not swallow whole.   ??? olmesartan-hydroCHLOROthiazide 40-25 mg tablet Take 0.5 tablets by mouth daily.     No facility-administered medications prior to visit.        PRECAUTIONS:  Dizziness and headache  Potential fall risk     RECENT INFECTIONS:  No    SUICIDE RISK:  No    FALLS ASSESSMENT:  Have you fallen in the last 12 months?: No         PERTINENT DIAGNOSTIC TESTS: none    Objective:     POSTURE/OBSERVATION:  (Anterior/Posterior/Sideview)  Spine: increase thoracic kyphosis, increase lumbar lordosis, neck is in a cervical collar  Pelvis: anterior pelvic tilt   Femur: increase femoral adduction and internal rotation bilaterally  Tibia/Foot: hindfoot valgus, flat medical arch, bilateral heels are slightly off the floor in standing    FUNCTIONAL MOVEMENT ANALYSIS AND RANGE OF MOTION:  Balance:   Double  leg: increase    Tandem:     1/2 tandem: increase sway 30 seconds bilaterally    Full: unable to bilaterally   Single leg: unable bilaterally  Age Eyes open (sec) Eyes closed (sec)   20-29 29 21    30-39 29 14   40-49 29 10   50-59 28 8   60-69 26 5   70-79 14 4     Sit-stand: able to stand up without hands assisting with standing up from patient's couch at home    5 times sit to stand: 18 seconds  Age Time (sec)   60-69 11.4   70-79 12.6   80-89 14.8       30 seconds sit to stand: 9 repetition  Age Female Female   39-64 15 22   65-69 27 22   70-74 14 15   75-79 13 14   80-84 12 13   85-89 11 11   90-94 9 9     Gait: decrease hip extension at terminal stance   No push off at ankle for plantarflexion bilaterally at push, ankle pronation at mid stance to terminal stance   Hands on wall in the hallway to maintain balance    Squat: lack of hip flexion range of motion, knee flexion dominantly   Early heel raises bilaterally with slight knee flexion         Ankle range of motion (measure from pictures taken)  Dorsiflexion: left 1-2 deg, right 5 deg  Plantarflexion: left 40 deg, right 45 deg                      ASSESSMENT:  Patient presents with referral diagnosis of   1. Balance disorder    . The patient demonstrated standing posture dysfunction with slight ankle of plantarflexion with increase posterior weight shift and loss of balance when cues to maintain heel contact with the floor, decrease balance with increase sway in double leg and tandem stance, unable to maintain single leg balance, decrease bilateral lower extremity strengthen and endurance, she also showed decrease ankle range of motion that possibly affecting standing alignment and stability. Patient also has a history of concussion that causing dizziness, headache and, cervical pain, also suspecting possible vestibular and ocular motion dysfunction that could contribute to her balance complaints as well. Today's examination is limited by the nature of a telehealth visit via video. Patient will  Patient is in chronic phase. The patient's primary impairment(s) is limiting their ability to ambulate and stand with better confident and stability to decrease risk of falls. The patient requires skilled therapy that can be safely and effective performed only by a qualified therapist to address the above deficits.    Short term goals: In 4-5 weeks,    Initiate home exercise program.    Patient will be able to improve ankle range of motion 5 deg bilaterally, so she can stand in heel contact for better stability.     Patient will be able to maintain double leg balance without noticeable sway for 30 seconds, so she can improve standing stability and decrease risk of fall.    Long term goals: In 10-12 weeks,    Patient will be able to maintain 3/4 tandem bilaterally for 30 seconds with minimal sway, so she can improve standing stability and decrease risk of fall.     Patient will be able to ambulate with 50% improvement of feeling unsteadiness subjectively while walking using single leg cane, so  she can improve confident.    Independent with home exercises.    OUTCOME MEASURES: did not complete    REHAB POTENTIAL: Fair     OBSTACLES TO REHABILITATION/CO-MORBIDITIES THAT MAY AFFECT GOALS AND TREATMENT PLAN: dizziness, headache, cervical pain and limited range of motion from a concussion 1 year ago    1. Co-morbidities or Personal Factors: 3 or more personal factors and/or comorbidities including dizziness, headache, cervical pain and limited range of motion from a concussion 1 year ago  2. Examination of body systems: 3 or more elements  3. Clinical Presentation: Evolving  4. Clinical Decision Making Complexity: moderate    Plan:   INTERVENTIONS:   Therapeutic Exercise, Manual Therapy, Neuromuscular Re-education, Therapeutic Activities, Group Therapy, Heat Therapy, Cold Therapy, Patient/Caregiver Education, Gait Training, Biofeedback and Light Therapy    All portions of the treatment plan can be delegated to the Physical Therapist Assistant with the exception of joint mobilization/manipulation.    Patient to be seen 1-2 times per week for 10-12 week(s).    Patient agrees with goals and treatment plan as outlined above:Yes    TREATMENT TODAY:    HMO:    Visit Number: 1 of 6 (requested 8 additional visits, pending)  Plan of Care Expires: 02/07/2021  Authorization Expires: 01/11/2021  Diagnosis:   Encounter Diagnoses   Name Primary?   ??? Balance disorder      Precautions: fall risk, spinal compression at cervical region (scheduled fusion surgery in July 2022)    Pain:Yes   Pain In: did not rate  Pain Out: did not rate  Location: neck cervical spine  Duration: Constant  Description: Aching and Sharp  Aggravating Factors: any neck movement  Alleviating Factors: wearing neck collar and injection  Pain Comments: numbness in bilateral hands    SUBJECTIVE: see subjective above    OBJECTIVE:    Evaluation performed, see above    Patient Education:   Education Content: anatomy, tissue physiology, posture, body mechanics, faulty movement patterns and compensation, examination findings and how it related to patient symptoms, ice/heat for pain management, plan of care, home exercise program   Education Delivery Method:   verbal and demonstration   Education Response:   verbalizes understanding and demonstrates understanding    ASSESSMENT:   Patient presents with referral diagnosis of   1. Balance disorder    . The patient demonstrated standing posture dysfunction with slight ankle of plantarflexion with increase posterior weight shift and loss of balance when cues to maintain heel contact with the floor, decrease balance with increase sway in double leg and tandem stance, unable to maintain single leg balance, decrease bilateral lower extremity strengthen and endurance, she also showed decrease ankle range of motion that possibly affecting standing alignment and stability. Patient also has a history of concussion that causing dizziness, headache and, cervical pain, also suspecting possible vestibular and ocular motion dysfunction that could contribute to her balance complaints as well. Today's examination is limited by the nature of a telehealth visit via video. Patient will  Patient is in chronic phase. The patient's primary impairment(s) is limiting their ability to ambulate and stand with better confident and stability to decrease risk of falls. The patient requires skilled therapy that can be safely and effective performed only by a qualified therapist to address the above deficits.    PLAN: oculomotor screen, vestibular screen; ankle range of motion (dorsiflexion focus) balance training in double leg and tandem, gait training, lower extremity strengthening    PTA Communication: All  portions of the treatment plan may be delegated to the PTA with the exception of joint mobilizations.    Patient was seen for a total of 45 minutes of treatment time.  0 minutes was in services represented by timed CPT codes.    Alta Corning, PT

## 2020-11-11 ENCOUNTER — Telehealth: Payer: MEDICAID

## 2020-11-11 LAB — Tissue Exam

## 2020-11-12 NOTE — Telephone Encounter
Reply by: Gretta Began,    Can you confirm that blue shield is her primary? We're not contracted with [employer]

## 2020-11-12 NOTE — Telephone Encounter
Forwarded by: Gretta Began,     Can you confirm that blue shield is her primary? We're not contracted with [employer]

## 2020-11-13 ENCOUNTER — Ambulatory Visit: Payer: MEDICAID

## 2020-11-14 ENCOUNTER — Ambulatory Visit: Payer: MEDICAID

## 2020-11-16 ENCOUNTER — Ambulatory Visit: Payer: MEDICAID

## 2020-11-16 ENCOUNTER — Telehealth: Payer: MEDICAID

## 2020-11-16 NOTE — Progress Notes
Patient Consent to Telehealth   The patient agreed to participate in the video visit prior to joining the visit.          CARDIOLOGY OUTPATIENT CONSULTATION NOTE    PRIMARY CARE PROVIDER: Clint Lipps, DO    REFERRED BY: Miquel Dunn., MD  [address]  [address]  [place],  [place] [address]    Chelsea [name]  [id number]  60 y.o.  [birth year]      REASON FOR CONSULTATION/REFERRAL: Abnormal Cardiac Stress Test and Cardiovascular Assessment     HISTORY OF PRESENT ILLNESS: Chelsea [name] is a 60 y.o. patient with history of DM, HTN, Hypothyroidism, Cervical spinal myelopathy, OSA, who presents to cardiology for Abnormal Cardiac Stress Test and Cardiovascular Assessment       Last seen by Dr. Ricki Miller in 1/22, who was thoughtful towards the patient's care to place in a referral and per notes  ''Had abnormal stress as part of preop exam with cardiology showing no ischemic EKG changes but e/o large anterior wall ischemia. Recommended for angiogram and starting ASA 81 mg every day. She was seen by a second cardiologist who repeated an exercise nuclear stress test with no evidence of ischemia and recommended stopping aspirin and no need for angiogram. She would like a second opinion at [facility]. Denies CP, SOB, orthopnea. Has mild BL LE edema.''    Today, pt states that she came out to [place] in 1983, but originally from [place], but here with kids 3, 7 grandchildren, 1 ggc all between [place] and [place]. 3 younger kids and great grandbaby 4 here in [place]. [place] in 09/2020 plan vacation and meet there with everyone. Son and DIL want ot go [place]. States the issues with back and forth stress tests results have been frustating and provided eduation regarding all tests and possible interpretiaons.     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 Pt otherwise doing well and went over results of NM stress test. 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 Still with spinal pain. Improved pain since injection/procedure this past Thursday. Denies CP.DOE. Pt prior nurse as well. Questions about BMI and BP as well.     - 6/22: Pt sent message via mychart previously and per chart '' Are my cholesterol numbers causing a medical concern regarding the functioning of  my heart?  Is my heart functioning okay?  I have been going to the [employer] to walk the treadmill. I know I???m completely over weight and out of shape. I???m concerned about when the treadmill - Target Heart Rate monitor reads 181 and I???m not walking fast and the machine suggests I slow down.  What should I do when my Target Heart Rate reach this 181 level?  Is this a concern? Also, may I please ask, what should my Target Heart Rate range should be while using the treadmill?  May I please ask you what is the 60 to 65% left ventricular not seen well mean?'' Today, pt states that some neck discomfort and pending surgery 01/05/21 and wearing neck brace and recent steroid injection in neck/back and helped but pain back and repeat injection 2 weeks ago. Pending eval with spine MD Dr. Konrad Felix tomorrow. Trying to go to gym, but limited due to neck pain. Went over all questions regarding cholesterol, and fitness/exercise regimen, heart rate, and prior cardiac tests.       PAST MEDICAL HISTORY:  Past Medical History:   Diagnosis Date   ???  Abnormal cardiovascular stress test 06/21/2020    06/21/2020 Preop exam showed abnormal stress test with no EKG e/o ischemia but e/o large anterior wall ischemia with recommendation for cardiac catheterization. Had second opinion with repeat exercise nuclear stress test showing no e/o ischemia and was recommended against cardiac catheterization. I recommended starting Atorvastatin 40 mg every day for her DM2, but patient wishes to repeat labs fir   ??? Diabetes mellitus (HCC/RAF) 02/20/2017    06/21/2020 Chronic, stable. Last HgA1c 6.6% on 06/03/19. C/w MFM 500 mg BID. Repeat HgA1c. Recommended starting Atorvastatin 40 mg every day but patient wishes to repeat labs first. Will need to clarify last retinopathy screening at next visit.    ? Hypertension, essential 06/21/2020    06/21/2020 Chronic,s table. C/w Benicar 40/25 mg x1/2 tablet every day. Reassess at next in office appointment.    ? Hypothyroidism 06/21/2020   ? Myelopathy concurrent with and due to spinal stenosis of cervical region (HCC/RAF) 06/21/2020    06/21/2020 Radiculopathy and myelopathy symptoms since forehead injury 08/25/19. MRI cervical spine 09/30/19 showing multilevel degenerative changes and central disk/osteophyte protrusion impinging the spinal cord with severe spinal stenosis and severe BL foraminal stenosis. Originally recommended for surgical intervention, which was then cancelled due to abnormal preoperative stress test. Neurosurge   ? OSA (obstructive sleep apnea) 06/21/2020    06/21/2020 Chronic, stable. C/w CPAP at bedtime.        PAST SURGICAL HISTORY:  Past Surgical History:   Procedure Laterality Date   ? CESAREAN SECTION      x2   ? CHOLECYSTECTOMY  1987   ? INCISIONAL HERNIA REPAIR  1987       ALLERGIES:  Allergies   Allergen Reactions   ? Iodinated Diagnostic Agents Anaphylaxis       CURRENT MEDICATIONS:  No current facility-administered medications for this visit.       SOCIAL HISTORY:  Social History     Socioeconomic History   ? Marital status: Single   Tobacco Use   ? Smoking status: Former Smoker     Packs/day: 0.25     Years: 5.00     Pack years: 1.25     Types: Cigarettes   ? Smokeless tobacco: Never Used   Vaping Use   ? Vaping Use: Never used   Substance and Sexual Activity   ? Alcohol use: Not Currently   ? Drug use: Not Currently         FAMILY HISTORY:  No family history on file.    REVIEW OF SYSTEMS  Comprehensive 14-point review of systems was conducted and negative except for those points indicated in the HPI.     PHYSICAL EXAMINATION:  VIDEO LIMITED PHYSICAL EXAMINATION:  General: NAD, AAOx4  HEENT: Anicteric, and normal eye motion via video  Neck: JVP unable to be assessed via video.   Heart: rate/rhythm/murmurs unable to be assessed via video  Lungs: breath sounds  unable to be assessed via video  Abd: bowel sounds unable to be assessed via video  Extremities: no clubbing, cyanosis, based on video, though unable to assess directly for edema  Skin: unable to be assessed via video  Neuro: grossly non focal on video   Psych: Pleasant, conversant, appropriate insight and orientation during video interview      DIAGNOSTIC STUDIES:  Lab Results   Component Value Date    NA 140 09/07/2020    K 4.0 09/07/2020    CL 101 09/07/2020    CO2 29 09/07/2020  Lab Results   Component Value Date    WBC 8.78 09/07/2020    HGB 13.5 09/07/2020    HCT 43.2 09/07/2020    MCV 88.7 09/07/2020    PLT 269 09/07/2020       Last 3 Lipids (Up to last 3 results from the past 60454 hours)      01/11 0819    Cholesterol       185  Comment: The significance of total cholesterol depends on the values of LDL, HDL, triglycerides and the clinical context. A patient-provider discussion may be considered.         Cholesterol, HDL       44  Comment: If HDL cholesterol level falls outside of the designatedrange, a patient-provider discussion is recommended         Triglycerides       107  Comment: If Triglyceride level falls outside of the designated range,a patient-provider discussion is recommended.         Non-HDL,Chol,Calc       141  Comment: If Non-HDL cholesterol level falls outside of the designatedrange, a patient-provider discussion is recommended.               Lab Results   Component Value Date    ALT 23 09/07/2020    AST 23 09/07/2020    ALKPHOS 87 09/07/2020    BILITOT 0.3 09/07/2020       Lab Results   Component Value Date    HGBA1C 6.6 (H) 09/14/2020       Future Appointments   Date Time Provider Department Center   11/17/2020  9:20 AM Miquel Dunn., MD CARD MP1 [id number] MEDICINE   11/18/2020  9:15 AM Agustina Caroli., DO ORT PHMED SP ORTHOPEDICS   11/18/2020 10:45 AM Donell Sievert, MD JS UOA OPH 11/22/2020  9:00 AM Clint Lipps, DO INTMED [id number] MEDICINE   11/23/2020 10:00 AM MP1 MR01 1.5T MRI MP1 [facility]   11/23/2020 12:30 PM Clide Deutscher, PT WW PT [facility] SERVIC   11/30/2020 10:30 AM Nelida Meuse, Boyd Kerbs, PT WW PT [facility] SERVIC   12/07/2020 10:30 AM Clide Deutscher, PT WW PT [facility] SERVIC   12/27/2020  2:30 PM Romeroso, Jillyn Hidden., MD [id number] OBGYN [facility]   12/28/2020  3:30 PM Miki Kins, PT WW PT RE[facility]AB SERVIC   02/01/2021  9:00 AM Mayo, Pincus Sanes., MD NEUSMPROV SM NEUROLOGY     10/1997 TTE  1. Technically difficult echocardiogram; left ventricular endocardium  not well seen. Normal left ventricular size, wall thickness, wall  motion and systolic function; estimated left ventricular ejection  fraction is 60-65 %.  2. Normal right ventricular size and systolic function.  3. Normal atrial sizes. Normal inferior vena cava size with normal  respiratory changes is consistent with normal right atrial  pressure.  4. Aortic valve morphology not well seen. Peak aortic valve velocity  is approximately 1.7 m/sec. No aortic regurgitation.  5. Mild focal thickened mitral valve. Mitral valve E-velocity is 99  cm/sec; peak mitral valve A-velocity is 78 cm/sec; mitral valve  isovolumic relaxation time is 75 msec. Mitral valve deceleration  time of 202 msec is within normal limits. No mitral regurgitation.  6. Minimal tricuspid regurgitation. Tricuspid regurgitant velocity is  2.3 m/sec. Estimated right ventricular systolic pressure is 31 mm  Hg (if right atrial pressure is 10 mm Hg) which is consistent with  normal pulmonary artery pressure.  7. No pulmonic valve regurgitation. Time to peak velocity of  right  ventricular outflow tract is approximately 167 msec which is  consistent with normal pulmonary artery pressure.  CONCLUSIONS  1. Technically difficult echocardiogram; grossly normal left  ventricular systolic function; left ventricular endocardium not  well seen.  2. Mildly thickened mitral valve.  3. Minimal tricuspid regurgitation.    OSH 10/21 NM stress  Normal exercise stress myocardial perfusion imaging. ???   The patient had no symptoms or EKG changes during exercise achieving 102%   of MPHR.   Gated SPECT imaging revealed normal myocardial perfusion and function with   no evidence of inducible ischemia    OSH 8/21 TTE  ??? ???Mild concentric left ventricular hypertrophy with normal systolic   function   ??? ???Normal diastolic function   ??? ???Mild left atrial enlargement   ??? ???Trace tricuspid regurgitation and normal pulmonary pressures    1/22 ECG  - sinus brady HR 58, QTC 410, no significant arrhythmias or prior MI or active ischemic changes noted    2/22 NM stress  CONCLUSIONS   1. Normal exercise stress ECG study at an adequate cardiac workload.   2. Good exercise tolerance.   3. Baseline blood pressure category is elevated (stage I hypertension). Blood pressure response to stress was exaggerated.   4. Patient's symptoms were not suggestive of ischemia.   5. ECG findings are not suggestive of ischemia.   6. No exercise induced arrhythmias were noted.  ???  1) Normal exercise stress myocardial perfusion imaging SPECT study at an adequate cardiac workload. There is no scintigraphic evidence of ischemia or prior myocardial infarction.  ???  2) Normal left ventricular size and systolic function. LVEF  65%.  ???  3) No previous cardiac nuclear stress test was available for comparison.    2018 ACC/AHA guidelines recommends moderate or high-intensity statin because of diabetes. Patient is receiving guideline-directed statin therapy; monitor adherence.  10-Year ASCVD risk is 18.9% Continue moderate or high-intensity statin therapy. Monitor adherence as of 9:38 AM on 11/17/2020.  10-Year ASCVD risk with optimal risk factors is 2.7%.  Values used to calculate ASCVD score:  Age: 60 y.o.   Gender: Female Race: Black  HDL cholesterol: 39 mg/dL. HDL cholesterol measured on 09/14/2020.  Total cholesterol: 166 mg/dL. Total cholesterol measured on 09/14/2020.  LDL cholesterol: 109 mg/dL. LDL cholesterol measured on 09/14/2020.  Systolic BP: 138 mm Hg. BP was measured on 11/08/2020.  The patient is being treated with a medication that influences SBP.  The patient is currently not a smoker.  The patient has a diagnosis of diabetes.  Click here for the [facility] ASCVD Cardiovascular Risk Estimator Plus tool Office manager).      ASSESSMENT/PLAN:   Chelsea [name] is a 60 y.o. patient with history of DM, HTN, Hypothyroidism, Cervical spinal myelopathy, OSA, who presents to cardiology for Abnormal Cardiac Stress Test and Cardiovascular Assessment     Abnormal Cardiac Stress Test and Cardiovascular Assessment   - ''Had abnormal stress as part of preop exam with cardiology showing no ischemic EKG changes but e/o large anterior wall ischemia. Recommended for angiogram and starting ASA 81 mg every day. She was seen by a second cardiologist who repeated an exercise nuclear stress test with no evidence of ischemia and recommended stopping aspirin and no need for angiogram''  - Prior 9/21 NM stress report noted large anterior infarct area but then NM stress done again (results above in 10/21) which were unremarkable. Only able to view reports, but given habitus, possible that anterior breast attenuation, if  not corrected for correctly on an NM study, can result in an anterior infarct pattern, especially if images were not corrected for gender/habitus. However, no images available for review, but requested pt to provided images on CD if possible.   - However, given concerns and distraught of pt regarding these discrepant results, discussed options regarding to have a final 3rd confirmatory test and discussed various stress testing options and risks and benefits of each (TTE stress echo, CCTA, NM stress, LHC, etc) and repeated NM stress per pt request to clarify findings, and performed 2/22 as above with normal results and negative for any ischemia or prior MI. And provided education today 6/22 regarding findings in more detail as per HPI for both stress test and prior echo, particularly educated pt in regards to heart rate and exercise target HR, prior TTE findings and cholesterol and mgmt.     #HLD  - LDL 109 in 3/22  - working on diet/exercise, and not reliably taking statin and provided education regarding compliance.     #Hypothyroidism  - S/p RAI .   - On Levothyroxine 100 mcg every day  - TSH WNL 1/22.   ?  #DM2  - Last HgA1c 6.7% on 06/29/20  - COnt metformin.    ?  #HTN  - SBP in clinic 120s previously   - Cont benicar  ?  #Obesity  - Chronic, stable  - Recommend cardiac healthy diet, exercise, and weight recommendations: eat an overall healthy dietary pattern similar to DASH (Dietary Approaches to Stop Hypertension) eating plan and aim for at least 150 minutes of moderate physical activity (aerobic) or 75 minutes of vigorous physical activity (or an equal combination of both) each week for weight loss as well as to improve physical and cardiovascular fitness, if possible. Also recommend education about intermittent fasting.   - Consider PT for neck pain and adaptation to exercise regimen    #OSA  - Cont CPAP qHS    Return to clinic PRN and with PCP in the interim (or sooner PRN)    Thank you for your referral Dr. Ricki Miller!      The above plan of care, diagnosis, orders, and follow-up were discussed with the patient. I personally reviewed labs/imaging/ECG/telemetry/hemodynamics/cardiodiagnostic studies in depth. 42 minutes were spent personally by me today on this encounter which include today's pre-visit review of the chart, obtaining appropriate history, performing an evaluation, documentation and discussion of management with details supported within the note for today's visit as welll as reviewing prior medical data, counseling patient and/or coordinating care, and in prior note and chart review of both cardiac and non-cardiac notes and studies, and analysis for medical decision making. Risks/Benefits/Alternatives of all cardiac medications explained, including (but not limited to), low blood pressure chest pain, shortness of breath, dyspnea on exertion, syncope, dizziness, PND/orthopnea, swelling, palpitations, rash, kidney/electrolyte abnormalities, etc. Questions related to this recommended plan of care were answered.     Miquel Dunn, MD  [facility]

## 2020-11-16 NOTE — Telephone Encounter
I scheduled an appointment for patient to discuss her results with Dr. Minna Antis.

## 2020-11-17 ENCOUNTER — Telehealth: Payer: PRIVATE HEALTH INSURANCE | Attending: Cardiovascular Disease

## 2020-11-17 DIAGNOSIS — E890 Postprocedural hypothyroidism: Secondary | ICD-10-CM

## 2020-11-17 DIAGNOSIS — Z136 Encounter for screening for cardiovascular disorders: Secondary | ICD-10-CM

## 2020-11-17 DIAGNOSIS — E119 Type 2 diabetes mellitus without complications: Secondary | ICD-10-CM

## 2020-11-18 ENCOUNTER — Telehealth: Payer: MEDICAID

## 2020-11-18 ENCOUNTER — Ambulatory Visit: Payer: MEDICAID | Attending: Ophthalmic Plastic and Reconstructive Surgery

## 2020-11-18 ENCOUNTER — Ambulatory Visit: Payer: MEDICAID

## 2020-11-18 NOTE — Telephone Encounter
Called patient to follow up of cervical injection.  No answer.  Message left.    Lollie Marrow

## 2020-11-18 NOTE — Telephone Encounter
Spoke with patient, she will receive a call from Dr Anola Gurney today .

## 2020-11-18 NOTE — Telephone Encounter
PDL Call to Practice    Reason for Call: Pt is asking if her appt this morning can be changed to a telephone appt instead. Called PDL line twice and no answer    Appointment Related?  [x]  Yes  []  No     If yes;  Date: 6/2  Time: 9:15am    Call warm transferred to PDL: []  Yes  [x]  No    Call Received by Practice Representative: No answer

## 2020-11-18 NOTE — Telephone Encounter
Call Back Request      Reason for call back: Patient is requesting a call back from Dr.Solberg's office regarding the missed call from [place] for today's follow up post visit from the cervical injection. Patient would like to set up another Video visit or telephone visit if possible as soon as possible. Please advise patient. Thank you    Any Symptoms:  []  Yes  [x]  No       If yes, what symptoms are you experiencing:    o Duration of symptoms (how long):    o Have you taken medication for symptoms (OTC or Rx):      Patient or caller has been notified of the 24-48 hour turnaround time.

## 2020-11-19 ENCOUNTER — Telehealth: Payer: MEDICAID

## 2020-11-19 NOTE — Telephone Encounter
PDL Call to Practice    Reason for Call: Patient is calling to get clarification on medications (medrol and benadryl) she took for her previous procedure (ultrasound with contrast for fibroids) with Dr Laurance Flatten. Per patient her Neurologist, Dr Brett Albino needs this information and does not see it in her medical chart. Please advise, thank you.    Appointment Related?  []  Yes  []  No     If yes;  Date:  Time:    Call warm transferred to PDL: []  Yes  []  No    Call Received by Practice Representative:

## 2020-11-19 NOTE — Telephone Encounter
Ok to schedule for next week.

## 2020-11-21 NOTE — Progress Notes
PATIENT: Chelsea [name]  MRN: [id number]  DOB: 23-Sep-1960  DATE OF SERVICE: 11/22/2020    CHIEF COMPLAINT:   Chief Complaint   Patient presents with   ? Results     Labs      Outpatient Clinic Video Visit     Patient Consent to Telehealth Questionnaire   [facility] TELEHEALTH PRECHECKIN QUESTIONS 11/21/2020   By clicking ''I Agree'', I consent to the below:  I Agree     - I agree  to be treated via a video visit and acknowledge that I may be liable for any relevant copays or coinsurance depending on my insurance plan.  - I understand that this video visit is offered for my convenience and I am able to cancel and reschedule for an in-person appointment if I desire.  - I also acknowledge that sensitive medical information may be discussed during this video visit appointment and that it is my responsibility to locate myself in a location that ensures privacy to my own level of comfort.  - I also acknowledge that I should not be participating in a video visit in a way that could cause danger to myself or to those around me (such as driving or walking).  If my provider is concerned about my safety, I understand that they have the right to terminate the visit.         Patient: Chelsea [name]  MRN: [id number]  PCP: Clint Lipps, DO  DATE: 11/22/2020    Video visit is being conducted today instead of an in-person visit due to COVID-19.       HPI   Chelsea [name] is a 60 y.o. female who  has a past medical history of Abnormal cardiovascular stress test (06/21/2020), Diabetes mellitus (HCC/RAF) (02/20/2017), Hypertension, essential (06/21/2020), Hypothyroidism (06/21/2020), Myelopathy concurrent with and due to spinal stenosis of cervical region (HCC/RAF) (06/21/2020), and OSA (obstructive sleep apnea) (06/21/2020). who presents for   Chief Complaint   Patient presents with   ? Results     Labs       #Fibroids  Planning on exploring UFE with IR  S/p repeat EMB 11/11/20 with pathology negative for malignancy    ?#Possible lacunar stroke  #Hypercholesterolemia with elevated ASCVD risk  Patient presents with acute intermittent disorientation with severe headaches since presumably 09/06/20. ?She reports forgetting her birthday month. ?She does have chronic history of neck pain, headaches, imbalance, and peripheral neuropathy due to her cervical spine disease, further below. ?  Also +history of concussions with forehead trauma. ?She feels her forehead still looks very different to her.  She was seen at urgent care on the same day, and has since had two [facility] visits on 09/07/20 and earlier today 09/09/20.  MR brain shows possible evolving subacute to chronic infarct within the posterior insular cortex. ?She is already started on atorvastatin. ?She plans to repeat imaging in 1 month as advised   Today, she reports headaches have stopped. ?Dizziness improved with sitting down and rest. ?She feels it is possible from doing too much but she is very overwhelmed. ?She has to sleep upright due to neck pain  09/22/2020 Started on ASA 81 mg every day + Atorvastatin 40 mg every day at last visit. She has not been taking the Atorvastatin. She has been taking the aspirin. Has an appointment with neuro on 10/25/20.   11/22/2020 Seen by neuro who recommended continuing ASA and statin therapy and repeating MRI brain w/ and w/o contrast.  She has a h/o allergy to  iodinated contrast. Seen by Cards 11/17/20 as well.     #DM2  Diagnosed with HgA1c 9%.   Initially treated with MFM then lost significant weight  11/22/2020 HgA1c 6.6% on 08/2020. Diet controlled    #Hypothyroidism  S/p RAI in 30's.?On LT4.Marland Kitchen  11/22/2020 Euthyroid 09/07/20    Cervical myeloradiculopathy  Onset since forehead injury 08/25/19, with evidence of cervical spinal stenosis on imaging. ?Her symptoms included severe neck pain, imbalance, headaches, and tingling in the hands. ?Addressed thoroughly with Dr. Vickki Hearing. ?  S/p C7-T1 interlaminar ESI on 07/28/20 with initial relief but tapered over course of a month. ?Imaging demonstrates multilevel degeneration with cervical stenosis most severe at C5/6. ?  Planning on surgical intervention with neurosurg Dr. Konrad Felix  11/22/2020 Scheduled for surgical intervention on 01/05/21 with Dr. Konrad Felix          Not addressed this visit:?    #Recurrent severe headaches  Following with Neurology    #HTN  09/22/2020 Chronic, stable.?Benicar 40/25 mg x1/2 tablet every day    Anxiety  Very anxious esp in s/o her chronic neck pain, headaches, and confusion  History of 20y disability due to severe panic disorder?  ?  #Obesity  She reports interval weight loss of 9 pounds - today's weight is 243 lbs  In weight loss program  ?    ?  #OSA  Remains on CPAP at bedtime        Patient Care Team:  Clint Lipps, DO as PCP - General (Family Medicine)  Clint Lipps, DO as PCP - Plan Assigned (Family Medicine)    MEDS     Outpatient Medications Prior to Visit   Medication Sig   ? aspirin 81 mg EC tablet Take 1 tablet (81 mg total) by mouth daily.   ? atorvastatin 10 mg tablet Take 1 tablet (10 mg total) by mouth daily.   ? ciclopirox 8% solution Apply topically at bedtime Apply to adjacent skin and affected nails daily. Remove with alcohol every 7 days. Use up to 48 weeks..   ? clobetasol 0.05% ointment Apply topically two (2) times daily APPLY AND GENTLY MASSAGE INTO AFFECTED AREA(S) TWICE DAILY. Do not apply to face, armpit, or groin..   ? diclofenac Sodium 1% gel apply 2 to 3 gram twice a day if needed   ? gabapentin (NEURONTIN) 100 mg capsule Take 1 capsule (100 mg total) by mouth three (3) times daily.   ? gabapentin 100 mg capsule .   ? gabapentin 300 mg capsule take 1 capsule by mouth five times a day   ? hydrocortisone 1% cream Apply topically.   ? levothyroxine 100 mcg tablet take 1 tablet by mouth every morning before breakfast.   ? LORazepam 0.5 mg tablet 1-2 tabs PO 30 minutes before your procedure.   ? medroxyPROGESTERone 10 mg tablet Take 1 tablet (10 mg total) by mouth daily.   ? olmesartan-hydroCHLOROthiazide 40-25 mg tablet Take 0.5 tablets by mouth daily.   ??? fluconazole 150 mg tablet 1 tab PO x 1. May repeat after 72h if symptoms persist/worsen.   ??? miSOPROStol 200 mcg tablet Place 1 tablet in each cheek of the mouth (400 mcg) 2-4 hours before your procedure and let them dissolve; do not swallow whole.   ??? clobetasol 0.05% ointment Apply topically two (2) times daily APPLY AND GENTLY MASSAGE INTO AFFECTED AREA(S) TWICE DAILY.   ??? diclofenac Sodium 1% gel Apply 4 g topically .     No facility-administered medications prior to visit.  PHYSICAL EXAM      Last Recorded Vital Signs:     Body mass index is 47.19 kg/m???.  Wt Readings from Last 3 Encounters:   11/22/20 (!) 258 lb (117 kg)   11/08/20 (!) 258 lb 6.4 oz (117.2 kg)   11/03/20 (!) 250 lb (113.4 kg)      Gen - Awake. NAD.    Pulm - Nml effort. Speaking in full sentences. No e/o respiratory distress.  Derm - Nml appearance and color.  Neuro - AAOx3.   Psych - Nml mood and affect. No tangential thinking or pressured speech.       LABS/STUDIES   I have:   []  Reviewed/ordered []  1 []  2 []  ? 3 unique laboratory, radiology, and/or diagnostic tests noted below    []  Reviewed []  1 []  2 []  ? 3 prior external notes and incorporated into patient assessment    Cardiology Dr. Theodoro Grist 11/17/20  Neuro Dr. Nancy Marus 11/08/20, 10/25/20  OBGYN Dr. Noralyn Pick 11/08/20, 10/28/20  []  Discussed management or test interpretation with external provider(s) as noted       Lab Studies:  CBC:   Results for orders placed or performed during the hospital encounter of 09/07/20   CBC   Result Value Ref Range    White Blood Cell Count 8.78 4.16 - 9.95 x10E3/uL    Red Blood Cell Count 4.87 3.96 - 5.09 x10E6/uL    Hemoglobin 13.5 11.6 - 15.2 g/dL    Hematocrit 81.1 91.4 - 45.2 %    Mean Corpuscular Volume 88.7 79.3 - 98.6 fL    Mean Corpuscular Hemoglobin 27.7 26.4 - 33.4 pg    MCH Concentration 31.3 (L) 31.5 - 35.5 g/dL    Red Cell Distribution Width-SD 44.8 36.9 - 48.3 fL    Red Cell Distribution Width-CV 13.8 11.1 - 15.5 %    Platelet Count, Auto 269 143 - 398 x10E3/uL    Mean Platelet Volume 10.0 9.3 - 13.0 fL    Nucleated RBC%, automated 0.0 No Ref. Range %    Absolute Nucleated RBC Count 0.00 0.00 - 0.00 x10E3/uL   Differential, Automated   Result Value Ref Range    Neutrophil Percent, Auto 65.9 No Ref. Range %    Lymphocyte Percent, Auto 25.2 No Ref. Range %    Monocyte Percent, Auto 6.9 No Ref. Range %    Eosinophil Percent, Auto 1.1 No Ref. Range %    Basophil Percent, Auto 0.6 No Ref. Range %    Immature Granulocytes% 0.3 No Reference Range %    Absolute Neut Count 5.90 1.80 - 6.90 x10E3/uL    Absolute Lymphocyte Count 2.26 1.30 - 3.40 x10E3/uL    Absolute Mono Count 0.62 0.20 - 0.80 x10E3/uL    Absolute Eos Count 0.10 0.00 - 0.50 x10E3/uL    Absolute Baso Count 0.05 0.00 - 0.10 x10E3/uL    Absolute Immature Gran Count 0.03 0.00 - 0.04 x10E3/uL   CBC with differential    Narrative    The following orders were created for panel order CBC with differential.  Procedure                               Abnormality         Status                     ---------                               -----------         ------  QVZ[[id number]]                          Abnormal            Final result               Differential, Automated[[id number]]                          Final result                 Please view results for these tests on the individual orders.       CMP:   Results for orders placed or performed during the hospital encounter of 09/07/20   Comprehensive Metabolic Panel   Result Value Ref Range    Sodium 140 135 - 146 mmol/L    Potassium 4.0 3.6 - 5.3 mmol/L    Chloride 101 96 - 106 mmol/L    Total CO2 29 20 - 30 mmol/L    Anion Gap 10 8 - 19 mmol/L    Glucose 82 65 - 99 mg/dL    Creatinine 3.29 5.18 - 1.30 mg/dL    GFR Estimate for African American >89 See GFR Additional Information mL/min/1.41m2    GFR Estimate for Non-African American >89 See GFR Additional Information mL/min/1.63m2    GFR Additional Information See Comment     Urea Nitrogen 18 7 - 22 mg/dL    Calcium 9.6 8.6 - 84.1 mg/dL    Total Protein 7.7 6.1 - 8.2 g/dL    Albumin 4.0 3.9 - 5.0 g/dL    Bilirubin,Total 0.3 0.1 - 1.2 mg/dL    Alkaline Phosphatase 87 37 - 113 U/L    Aspartate Aminotransferase 23 13 - 62 U/L    Alanine Aminotransferase 23 8 - 70 U/L     Hgb A1C:   Lab Results   Component Value Date/Time    HGBA1C 6.6 (H) 09/14/2020 08:47 AM     Lipids:   Results for orders placed or performed in visit on 09/14/20   Lipid Panel   Result Value Ref Range    Cholesterol 166 See Comment mg/dL    Cholesterol,LDL,Calc 109 (H) <100 mg/dL    Cholesterol, HDL 39 (L) >50 mg/dL    Triglycerides 91 <660 mg/dL    Non-HDL,Chol,Calc 630 <130 mg/dL      TSH:   Lab Results   Component Value Date/Time    TSH 1.6 09/07/2020 01:13 PM      EMB (11/08/20):  FINAL DIAGNOSIS    ?  A. ENDOMETRIUM, PASS #1 (BIOPSY):  - Superficial strips of proliferative endometrium   - Negative for malignancy  ?  B. ENDOMETRIUM, PASSES #2 AND #3 (BIOPSY):  - Superficial strips and fragments of proliferative endometrium with metaplastic changes  - Benign endocervical and squamous epithelium  - Negative for malignancy         Imaging Studies:   MRI Brain (10/27/20):  IMPRESSION:  ?  No acute infarct or intracranial hemorrhage.  Nonspecific FLAIR hyperintensity within the posterior insular cortex, without interval progression. The overall appearance is grossly unchanged from the prior study.     A&P   Chelsea [name] is a 60 y.o. female presenting for   Chief Complaint   Patient presents with   ? Results     Labs         PROBLEM & ORDERS    ICD-10-CM  1. Allergy to iodinated contrast  Z91.041 methylPREDNISolone 32 mg tablet     diphenhydrAMINE 50 mg capsule   2. Type 2 diabetes mellitus without complication, without long-term current use of insulin (HCC/RAF)  E11.9 Hgb A1c   3. Candidate for statin therapy due to risk of future cardiovascular event  Z91.89 Lipid Panel   4. Myelopathy concurrent with and due to spinal stenosis of cervical region (HCC/RAF)  M48.02     G99.2    5. Postablative hypothyroidism  E89.0    6. Fibroid  D21.9    7. Abnormal finding on MRI of brain  R90.89        ASSESSMENT  #Allergy to iodinated contrast  Going for MRI brain w/ and w/o contrast to evaluate for possible lacunar stroke on prior brain MRI.   -Start Methylprednisolone 32 mg x1 12 hours prior to contrast, Methylprednisolone 32 mg x1 2 hours prior to contrast, Benadryl 50 mg x1 1 hour prior to contrast    Problem List Items Addressed This Visit        HCC Codes    ? Diabetes mellitus (HCC/RAF)     Overview      06/21/2020 Chronic, stable. Last HgA1c 6.6% on 06/03/19. C/w MFM 500 mg BID. Repeat HgA1c. Recommended starting Atorvastatin 40 mg every day but patient wishes to repeat labs first. Will need to clarify last retinopathy screening at next visit.   07/06/2020 Chronic, stable. HgA1c 6.7% on 06/29/20 with negative UACR. Recommended resuming MFM XR 500 mg every day, declined at this time. Start Atorvastatin 20 mg every day with plans to uptitrate. Recommended pneumococcal vaccine, which was declined at this time. Ophtho referral for retinopathy screening.   07/16/2020 Chronic, stable. Did not resume MFM XR 500 mg every day. Reiterated my recommendation to restart MFM 500 mg every day. Recommended restarted Atorvastatin 20 mg every day.   08/31/2020 Chronic, stable. Patient declines medication. C/w diet and exercise. Has appt for retinopathy screening at [facility]. Recommended PPSV23, which was declined. Recommended moderate to high intesnity statin, which was declined. Therefore will start Atorvastatin 10 mg every day.   11/22/2020 Chronic, stable. HgA1c 6.6% on 09/14/20. C/w diet and exercise. C/w Atorvastatin 10 mg every day (recommended at least 40 mg every day, which was refused).            ? Myelopathy concurrent with and due to spinal stenosis of cervical region (HCC/RAF)     Overview      06/21/2020 Radiculopathy and myelopathy symptoms since forehead injury 08/25/19. MRI cervical spine 09/30/19 showing multilevel degenerative changes and central disk/osteophyte protrusion impinging the spinal cord with severe spinal stenosis and severe BL foraminal stenosis. Originally recommended for surgical intervention, which was then cancelled due to abnormal preoperative stress test. Neurosurgery referral provided today. She has had a h/o difficult intubation in the past.   07/06/2020 Chronic, stable. Evaluated by neurosurg with plans to repeat MRI in 1 month and consider surgical intervention. Seen by PMR who prescribed medrol dose pack. C/w Gabapentin.   07/16/2020 Chronic, stable. MRI cervical spine with moderate to severe spinal canal stenosis and mild increased T2 signal suggestive of cord edema. Medrol dosepack and Meloxicam 7.5 mg every day + Gabapentin for pain control. Recommended urgent f/u with Neurosurg.   09/10/2020  Chronic, stable. S/p ESI by Dr. Vickki Hearing on 07/28/20 with significant improvement. Following with neurosurgery and pmr on gabapentin.  11/22/2020 Chronic, stable. Planning on surgical intervention with Dr. Konrad Felix on 01/05/21.  Other    ? Abnormal finding on MRI of brain     Overview      MRI brain ordered for w/u of H/A showed possible evolving subacute to chronic infarct in the posterior insular cortex. No neurologic deficits on exam. C/w ASA 81 mg every day + Atorvastatin 10 mg every day (patient refused higher dose). Neurology referral provided.   11/22/2020 Seen by Neuro who recommended continuing with ASA and statin therapy for secondary prevention of possible lacunar stroke. Neuro has also ordered repeat MRI brain w/ and w/o contrast. Will premedicate with Methylpred 32 mg 12 hours pre-contrast, 2 hours pre-contrast, and 50 mg benadryl 1 hour pre contrast.            ? Candidate for statin therapy due to risk of future cardiovascular event     Overview      09/10/2020  Chronic, stable. Start Atorvastatin 40 mg every day given DM2 + age + abnormal MRI brain finding c/f possible evolving subacute to chronic infarct. Will start ASA 81 mg every day as well and recommended f/u with neurology to discuss.   09/22/2020 Chronic, stable. Patient is very hesitant to take cholesterol lowering medication despite counseling on indication with DM2 + age > 40 and possible subacute to chronic infarct on MRI brain. Patient agreeable to start Atorvastatin 10 mg every day, c/w ASA 81 mg every day, and f/u with neurology. Repeat MRI brain previously ordered.   11/22/2020 Chronic, stable. Poor compliance with statin therapy. C/w Atorvastatin 10 mg every day (recommended at least 40 mg every day but patient refuses). C/w ASA 81 mg every day for secondary prevention of possible lacunar stroke on MRI brain.            ? Fibroid     Overview      Chronic, stable. S/p EMB 11/11/20 with pathology negative for malignancy. Following with GYN with plans to pursue UFE with IR.            ? Hypothyroidism     Overview      S/p RAI in 30's.  09/10/2020 Chronic, stable. Euthyroid on labs from 06/29/20. C/w current regimen of Levothyroxine 100 mcg every day.   11/22/2020 Chronic, stable. Euthyroid on labs from 09/07/20. C/w current regimen of Levothyroxine 100 mcg every day.                    Diagnoses and all orders for this visit:    Allergy to iodinated contrast  -     methylPREDNISolone 32 mg tablet; Take Methylprednisolone 32 mg x1 tablet 12 hours prior to contrast administration then take Methylprednisolone 32 mg x1 tablet 2 hours prior to contrast administration..  -     diphenhydrAMINE 50 mg capsule; Take 50 mg benadryl by mouth 1 hour prior to contrast administration.    Type 2 diabetes mellitus without complication, without long-term current use of insulin (HCC/RAF)  -     Hgb A1c; Future    Candidate for statin therapy due to risk of future cardiovascular event  -     Lipid Panel; Future    Myelopathy concurrent with and due to spinal stenosis of cervical region (HCC/RAF)    Postablative hypothyroidism    Fibroid    Abnormal finding on MRI of brain      Orders Placed This Encounter   ? Lipid Panel   ? Hgb A1c   ? methylPREDNISolone 32 mg tablet   ? diphenhydrAMINE 50 mg  capsule       The above recommendation were discussed with the patient.  The patient has all questions answered satisfactorily and is in agreement with this recommended plan of care.      Clint Lipps, DO  Clinical Instructor  Department of Medicine  11/22/2020 9:23 AM

## 2020-11-22 ENCOUNTER — Ambulatory Visit: Payer: MEDICAID

## 2020-11-22 ENCOUNTER — Telehealth: Payer: BLUE CROSS/BLUE SHIELD

## 2020-11-22 DIAGNOSIS — E890 Postprocedural hypothyroidism: Secondary | ICD-10-CM

## 2020-11-22 DIAGNOSIS — Z91041 Radiographic dye allergy status: Secondary | ICD-10-CM

## 2020-11-22 DIAGNOSIS — E119 Type 2 diabetes mellitus without complications: Secondary | ICD-10-CM

## 2020-11-22 MED ORDER — DIPHENHYDRAMINE HCL 50 MG PO CAPS
ORAL_CAPSULE | 0 refills | Status: AC
Start: 2020-11-22 — End: ?

## 2020-11-22 MED ORDER — METHYLPREDNISOLONE 32 MG PO TABS
ORAL_TABLET | 0 refills | Status: AC
Start: 2020-11-22 — End: ?

## 2020-11-23 ENCOUNTER — Inpatient Hospital Stay: Payer: MEDICAID | Attending: Neurology

## 2020-11-23 ENCOUNTER — Ambulatory Visit: Payer: MEDICAID

## 2020-11-23 DIAGNOSIS — R9089 Other abnormal findings on diagnostic imaging of central nervous system: Secondary | ICD-10-CM

## 2020-11-23 MED ORDER — DIPHENHYDRAMINE HCL 25 MG PO CAPS
50 mg | ORAL_CAPSULE | Freq: Once | ORAL | 0 refills | Status: AC
Start: 2020-11-23 — End: ?

## 2020-11-23 MED ORDER — PREDNISONE 50 MG PO TABS
50 mg | ORAL_TABLET | Freq: Once | ORAL | 0 refills | Status: AC
Start: 2020-11-23 — End: ?

## 2020-11-23 MED ADMIN — GADOBUTROL 1 MMOL/ML IV SOLN: 10 mL | INTRAVENOUS | @ 18:00:00 | Stop: 2020-11-23 | NDC 50419032512

## 2020-11-23 NOTE — Addendum Note
Addended by: Pascal Lux on: 11/22/2020 06:22 PM     Modules accepted: Orders

## 2020-11-23 NOTE — Telephone Encounter
mychart msg sent to pt 

## 2020-11-23 NOTE — Treatment Summary
PHYSICAL THERAPY TREATMENT NOTE    PATIENT: Chelsea [name]  GENDER: female  AGE: 60 y.o.  DOB: February 10, 1961  MRN: [id number]    Diagnosis:     ICD-10-CM    1. Balance disorder  R26.89      HMO:    Visit Number: 2 of 14  Plan of Care Expires: 02/07/2021  Authorization Expires: 03/10/2021    Precautions:   Dizziness and headache  Potential fall risk   spinal compression at cervical region (scheduled fusion surgery in July 2022)    Pain:Yes   Pain In: 3-4/10  Pain Out: 3-4/10  Location: neck cervical spine  Duration: Constant  Description: Aching and Sharp  Aggravating Factors: any neck movement  Alleviating Factors: wearing neck collar and injection  Pain Comments: numbness in bilateral hands    SUBJECTIVE: Patient denies falls. She notes that she has some neck pain today. She notes she can't balance flat on her feet. She feels like she will fall if she leans back on her heels and feels like she's rocking sometimes. More so feels imbalanced. Takes meclizine for dizziness.     OBJECTIVE:     Vestibular ocular reflex: deferred due to limited cervical range of motion and pain     Vestibular ocular reflex cancellation: negative      Smooth pursuit: mild nystagmus/saccade right     Saccades: decrease velocity and slight overshoot right with horizontal; positive with vertical (down>up)     Head impulse test: deferred due to limited cervical range of motion and pain     Treatment:   -Balance training:    -tandem stance with intermittent upper extremity support   -Sit to stand: with cueing for neutral knee alignment and core/gluteal activation x10   -beginner saccades  -smooth pursuit      Patient Education:   Education Content: nature of symptoms and importance of addressing oculomotor impairments after concussion    Education Delivery Method:   verbal, written and demonstration   Education Response:   verbalizes understanding and demonstrates understanding    ASSESSMENT: Patient with a need for oculomotor exercises for saccadic eye movement and smooth pursuit. Initiated balance training and lower extremity strengthening along with oculomotor exercises. Patient able to tolerate all activity, but does present with deconditioning (as 10 sit to stands was strenuous).       PLAN:  ankle range of motion (dorsiflexion focus) balance training in double leg and tandem, gait training, lower extremity strengthening    Short term goals: In 4-5 weeks,  ?  Initiate home exercise program.  ?  Patient will be able to improve ankle range of motion 5 deg bilaterally, so she can stand in heel contact for better stability.   ?  Patient will be able to maintain double leg balance without noticeable sway for 30 seconds, so she can improve standing stability and decrease risk of fall.  ?  Long term goals: In 10-12 weeks,  ?  Patient will be able to maintain 3/4 tandem bilaterally for 30 seconds with minimal sway, so she can improve standing stability and decrease risk of fall.   ?  Patient will be able to ambulate with 50% improvement of feeling unsteadiness subjectively while walking using single leg cane, so she can improve confident.  ?  Independent with home exercises.    PTA Communication: All portions of the treatment plan may be delegated to the PTA with the exception of spinal joint mobilizations and grade IV and above peripheral joint mobilizations.  Patient was seen for a total of 30 minutes of treatment time.  23 minutes was in services represented by timed CPT codes.    Clide Deutscher, PT

## 2020-11-25 ENCOUNTER — Ambulatory Visit: Payer: MEDICAID

## 2020-11-29 ENCOUNTER — Ambulatory Visit: Payer: MEDICAID

## 2020-11-30 ENCOUNTER — Ambulatory Visit: Payer: MEDICAID

## 2020-11-30 NOTE — Treatment Summary
PHYSICAL THERAPY TREATMENT NOTE    PATIENT: Chelsea [name]  GENDER: female  AGE: 60 y.o.  DOB: 1961/03/08  MRN: [id number]    Diagnosis:     ICD-10-CM    1. Balance disorder  R26.89      HMO:    Visit Number: 3 of 14  Plan of Care Expires: 02/07/2021  Authorization Expires: 03/10/2021    Precautions:   Dizziness and headache  Potential fall risk   spinal compression at cervical region (scheduled fusion surgery in July 2022)    Pain:Yes   Pain In: 3-4/10  Pain Out: 3-4/10  Location: neck cervical spine  Duration: Constant  Description: Aching and Sharp  Aggravating Factors: any neck movement  Alleviating Factors: wearing neck collar and injection  Pain Comments: numbness in bilateral hands    SUBJECTIVE: Denies falls. Patient notes that she has been taking her time with walking/moving. Still depends on the walls in her home. Trying to wean of assistive device, but still needs walker or cane.     OBJECTIVE:     Patient donned neck brace (soft collar) prior to treatment as she notes she didn't want to allow for any neck pain.      Seated: (utilized tongue depressor with green sticker on end for target)  -smooth pursuit in cross pattern x30 seconds   -saccades: horizontal, vertical, diagonal left/right // increased difficulty with vertical (decreased velocity of eye movement)     Sit to stand 2x10 with cueing for hip hinge/moving center of mass anterior rather than posterior and weight bearing through whole foot     Heel toe rocking x10 // good ankle strategy with intermittent need for upper extremity support     Tandem balance 2x20 seconds with either foot posterior // intermittent upper extremity support     Soleus stretching 2x30 seconds each with cueing for neutral foot alignment rather than excessive abduction of foot     Gait with cueing for heel to toe gait (push off of whole ball of foot rather than medially) to limit tendency for left foot to hit walking, as patient noted she kept stubbing her toes    Patient Education:   Education Content: nature of symptoms and importance of addressing oculomotor impairments after concussion    Education Delivery Method:   verbal, written and demonstration   Education Response:   verbalizes understanding and demonstrates understanding    ASSESSMENT: Patient checked in early, then at time of appointment had to use the restroom. Patient with improved sit to stand with cueing for transfer of center of mass anteriorly through hip flexion/forward trunk lean. Educated to perform oculomotor exercises in standing to increase difficulty. Progressed balance training with heel/toe rocking; patient with good ankle strategy. Will continue to benefit from balance training, gait training, strengthening, and oculomotor exercises to progress stability and decrease fall risk.       PLAN:  review home program, progress lower extremity strengthening - try straight leg raise, balance, step up, standing marching, progress saccades and smooth pursuit - practice standing     Short term goals: In 4-5 weeks,  ?  Initiate home exercise program.  ?  Patient will be able to improve ankle range of motion 5 deg bilaterally, so she can stand in heel contact for better stability.   ?  Patient will be able to maintain double leg balance without noticeable sway for 30 seconds, so she can improve standing stability and decrease risk of fall.  ?  Long term  goals: In 10-12 weeks,  ?  Patient will be able to maintain 3/4 tandem bilaterally for 30 seconds with minimal sway, so she can improve standing stability and decrease risk of fall.   ?  Patient will be able to ambulate with 50% improvement of feeling unsteadiness subjectively while walking using single leg cane, so she can improve confident.  ?  Independent with home exercises.    PTA Communication: All portions of the treatment plan may be delegated to the PTA with the exception of spinal joint mobilizations and grade IV and above peripheral joint mobilizations. Patient was seen for a total of 25 minutes of treatment time.  23 minutes was in services represented by timed CPT codes.    Clide Deutscher, PT

## 2020-12-01 ENCOUNTER — Telehealth: Payer: MEDICAID

## 2020-12-01 ENCOUNTER — Ambulatory Visit: Payer: MEDICAID

## 2020-12-01 DIAGNOSIS — L309 Dermatitis, unspecified: Secondary | ICD-10-CM

## 2020-12-01 DIAGNOSIS — L853 Xerosis cutis: Secondary | ICD-10-CM

## 2020-12-01 MED ORDER — CLOBETASOL PROPIONATE 0.05 % EX OINT
Freq: Two times a day (BID) | TOPICAL | 1 refills | Status: AC
Start: 2020-12-01 — End: ?

## 2020-12-01 MED ORDER — CLOBETASOL PROPIONATE 0.05 % EX OINT
Freq: Two times a day (BID) | TOPICAL | 4 refills | Status: AC
Start: 2020-12-01 — End: ?

## 2020-12-01 NOTE — Telephone Encounter
My chart sent to pt to notify rx was sent.

## 2020-12-01 NOTE — Telephone Encounter
Reply by: Shedrick Sarli M. Rozina Pointer  sent

## 2020-12-01 NOTE — Progress Notes
Patient Exam- TELEMEDICINE  ?  Chief complaint: hand derm f/u  ?  History of present illness:  Chelsea [name], a 60 y.o. female f/u hand derm, occ using clobetasol wet wraps.  moisturizing often , improved when using clobetasol but currently flaring    Patient denies any other new or changing lesions of concern.    ?PMH/Meds/All/Soc/Fam hx: Reviewed in [facility]    Personal Hx skin cancer:  Negative for melanoma and negative for non-melanoma skin cancer  Fam Hx skin cancer:  Negative for melanoma and negative for non-melanoma skin cancer     No new changes to PMH/Meds/All/Soc/Fam hx - please refer to today's intake form.  Patient Active Problem List   Diagnosis   ? Diabetes mellitus (HCC/RAF)   ? Abnormal cardiovascular stress test   ? OSA (obstructive sleep apnea)   ? Myelopathy concurrent with and due to spinal stenosis of cervical region (HCC/RAF)   ? Hypertension, essential   ? Hypothyroidism   ? Morbid (severe) obesity due to excess calories (HCC/RAF)   ? Candidate for statin therapy due to risk of future cardiovascular event   ? Abnormal finding on MRI of brain   ? Fibroid   ? Balance disorder     Outpatient Medications Prior to Visit   Medication Sig   ? aspirin 81 mg EC tablet Take 1 tablet (81 mg total) by mouth daily.   ? atorvastatin 10 mg tablet Take 1 tablet (10 mg total) by mouth daily.   ? ciclopirox 8% solution Apply topically at bedtime Apply to adjacent skin and affected nails daily. Remove with alcohol every 7 days. Use up to 48 weeks..   ? clobetasol 0.05% ointment Apply topically two (2) times daily APPLY AND GENTLY MASSAGE INTO AFFECTED AREA(S) TWICE DAILY. Do not apply to face, armpit, or groin..   ? diclofenac Sodium 1% gel apply 2 to 3 gram twice a day if needed   ? diphenhydrAMINE 50 mg capsule Take 50 mg benadryl by mouth 1 hour prior to contrast administration.   ? gabapentin (NEURONTIN) 100 mg capsule Take 1 capsule (100 mg total) by mouth three (3) times daily.   ? gabapentin 100 mg capsule .   ? gabapentin 300 mg capsule take 1 capsule by mouth five times a day   ? hydrocortisone 1% cream Apply topically.   ? levothyroxine 100 mcg tablet take 1 tablet by mouth every morning before breakfast.   ? LORazepam 0.5 mg tablet 1-2 tabs PO 30 minutes before your procedure.   ? medroxyPROGESTERone 10 mg tablet Take 1 tablet (10 mg total) by mouth daily.   ? methylPREDNISolone 32 mg tablet Take Methylprednisolone 32 mg x1 tablet 12 hours prior to contrast administration then take Methylprednisolone 32 mg x1 tablet 2 hours prior to contrast administration..   ? olmesartan-hydroCHLOROthiazide 40-25 mg tablet Take 0.5 tablets by mouth daily.     No facility-administered medications prior to visit.     Allergies   Allergen Reactions   ? Iodinated Diagnostic Agents Anaphylaxis     ?  Review of systems:   ?Constitutional: Negative  ENMT: Not assessed   Endo: Not assessed   Musculoskelatal: Not assessed   Skin otherwise Negative  Conjunctiva and Lids: Negative    Objective:   General appearance: well-developed/well nourished for stated age, no apparent distress.  Mental status: alert and oriented.   Mood: cooperative, pleasant.  Affect: normal  A examination of skin was performed on hands and ears  -Erythematous, scaly  ill-defined patches distributed over the bilateral dorsal hands, with superficial erosions  -Diffuse dry, rough, cracked, scaly skin    Assessment/Plan:  1. Hand Dermatitis, flare  - reviewed benign but chronic nature of disease  - gentle skin care recommendations:  - decrease hand washing frequency  - apply emollient post-hand washing (e.g. Vaseline/petroleum jelly)  - topical steroid side effects and use for active lesions only was reviewed   - clobetesol 0.05%AA to BID   - wear plastic non-latex gloves for wet work (e.g. washing dishes, gardening); use cotton glove as an inner liner if needed to wick away sweat  - wet wraps wraps as needed    Xerosis  --Reviewed dry skin care including short, lukewarm baths (< 5 minutes/day).  Use fragrance-free hypoallergenic soap (such as Dove beauty bar for sensitive skin).  Apply generous amount of mild, hypoallergenic emollients in cream form (such as Aveeno, Cetaphil, Eucerin or Aquaphor) within 3 minutes of bathing and several times throughout the day as tolerated.    Sun protection strategies including sunscreen use with SPF 30+ and skin cancer risk factors reviewed.   ?  Follow-up in 1 month    Lorin Picket, M.D.  Dermatology

## 2020-12-03 ENCOUNTER — Ambulatory Visit: Payer: MEDICAID

## 2020-12-07 ENCOUNTER — Ambulatory Visit: Payer: MEDICAID

## 2020-12-09 ENCOUNTER — Ambulatory Visit: Payer: MEDICAID

## 2020-12-10 ENCOUNTER — Ambulatory Visit: Payer: MEDICAID

## 2020-12-12 ENCOUNTER — Ambulatory Visit: Payer: MEDICAID

## 2020-12-13 ENCOUNTER — Telehealth: Payer: MEDICAID

## 2020-12-13 ENCOUNTER — Ambulatory Visit: Payer: MEDICAID

## 2020-12-13 DIAGNOSIS — Z01818 Encounter for other preprocedural examination: Secondary | ICD-10-CM

## 2020-12-13 NOTE — Telephone Encounter
Spoke with patient at length. She states that she saw a dentist recently who told her that she has one broken tooth and three other teeth that need to be extracted due to infection in her mouth. The dentist wants to remove the teeth prior to starting antibiotic therapy. She states that she does not want to have her teeth removed at this time.     She is concerned that this will effect her upcoming surgery. She wants to know if she can just have the broken tooth repaired and take antibiotics for her issue.     I advised her that we would defer to dentistry for treatment of her teeth / mouth infection, but that having broken teeth and / or an active infection in her mouth would likely mean that her surgery would have to be rescheduled.   Referrals to [facility] dentistry and infectious disease placed. Patient provided with contact information for scheduling appointments.

## 2020-12-13 NOTE — Telephone Encounter
Call Back Request      Reason for call back: Patient would like to speak with the office in regards to an urgent matter.  She did not specify what the matter was, however, she would like a call back as soon as possible.  She did state it was in regards to her cervical spine but did not provide any additional information.  Please advise, thank you.     Any Symptoms:  []  Yes  [x]  No       If yes, what symptoms are you experiencing:    o Duration of symptoms (how long):    o Have you taken medication for symptoms (OTC or Rx):      Patient or caller has been notified of the 24-48 hour turnaround time.

## 2020-12-14 ENCOUNTER — Telehealth: Payer: MEDICAID

## 2020-12-14 DIAGNOSIS — M4722 Other spondylosis with radiculopathy, cervical region: Secondary | ICD-10-CM

## 2020-12-14 DIAGNOSIS — M4712 Other spondylosis with myelopathy, cervical region: Secondary | ICD-10-CM

## 2020-12-15 NOTE — Progress Notes
Patient Consent to Telehealth   The patient agreed to participate in the video visit prior to joining the visit.        [facility] Physiatry Follow up Note    Attending Physician: Rhetta Mura, DO    Chief Complaint:    Chief Complaint   Patient presents with   ? Neck Pain       Today's Date: 12/14/2020    Last Visit:  08/12/2020    Since that time: Patient returns today for follow up.  The cervical ESI is still giving her significant relief.  She may have to delay her surgery to to tooth infection. No new weakness or numbness.  She is taking intermittent gabapentin and tylenol and is overall managing with this.    Cervical ESI 11/03/2020  Cervical IL-ESI  07/28/2020    HISTORICAL DATA     Ms. Severt is a 60 y.o. female, who is seen for consultation and evaluation at the request of Dr. Konrad Felix for pain in the neck with radiation into the bilateral upper extremity and consideration of injection.  Patient reports she hit forehead about 9 months ago.  She then got severe neck and bilateral radiating arm pain, weakness and numbness.  Numbness is constant in the hands.  She has difficulty with arm coordination.  She reports balance issues chronically and has need a cane.      She has tried naprosyn-- doesn't tolrate  Gabapentin 300mg  1300mg  daily- mild help      Previous work-up has included: MRI  Has tried the following treatments: medications  Associated symptoms - + bilateral upper extremity tingling, numbness, or weakness.  Denies bowel or bladder incontinence.  + gait instability.    Current Medications:  Current Outpatient Medications   Medication Sig   ? aspirin 81 mg EC tablet Take 1 tablet (81 mg total) by mouth daily.   ? atorvastatin 10 mg tablet Take 1 tablet (10 mg total) by mouth daily.   ? ciclopirox 8% solution Apply topically at bedtime Apply to adjacent skin and affected nails daily. Remove with alcohol every 7 days. Use up to 48 weeks..   ? clobetasol 0.05% ointment Apply topically two (2) times daily APPLY AND GENTLY MASSAGE INTO AFFECTED AREA(S) TWICE DAILY. Do not apply to face, armpit, or groin..   ? clobetasol 0.05% ointment Apply topically two (2) times daily APPLY AND GENTLY MASSAGE INTO AFFECTED AREA(S) TWICE DAILY. Do not apply to face, armpit, or groin..   ? clobetasol 0.05% ointment Apply topically two (2) times daily APPLY AND GENTLY MASSAGE INTO AFFECTED AREA(S) TWICE DAILY. Do not apply to face, armpit, or groin..   ? diclofenac Sodium 1% gel apply 2 to 3 gram twice a day if needed   ? diphenhydrAMINE 50 mg capsule Take 50 mg benadryl by mouth 1 hour prior to contrast administration.   ? gabapentin (NEURONTIN) 100 mg capsule Take 1 capsule (100 mg total) by mouth three (3) times daily.   ? gabapentin 100 mg capsule .   ? gabapentin 300 mg capsule take 1 capsule by mouth five times a day   ? hydrocortisone 1% cream Apply topically.   ? levothyroxine 100 mcg tablet take 1 tablet by mouth every morning before breakfast.   ? LORazepam 0.5 mg tablet 1-2 tabs PO 30 minutes before your procedure.   ? medroxyPROGESTERone 10 mg tablet Take 1 tablet (10 mg total) by mouth daily.   ? methylPREDNISolone 32 mg tablet Take Methylprednisolone 32  mg x1 tablet 12 hours prior to contrast administration then take Methylprednisolone 32 mg x1 tablet 2 hours prior to contrast administration..   ? olmesartan-hydroCHLOROthiazide 40-25 mg tablet Take 0.5 tablets by mouth daily.     No current facility-administered medications for this visit.        Allergies:  Iodinated diagnostic agents    Past Medical History:   Past Medical History:   Diagnosis Date   ? Abnormal cardiovascular stress test 06/21/2020    06/21/2020 Preop exam showed abnormal stress test with no EKG e/o ischemia but e/o large anterior wall ischemia with recommendation for cardiac catheterization. Had second opinion with repeat exercise nuclear stress test showing no e/o ischemia and was recommended against cardiac catheterization. I recommended starting Atorvastatin 40 mg every day for her DM2, but patient wishes to repeat labs fir   ? Diabetes mellitus (HCC/RAF) 02/20/2017    06/21/2020 Chronic, stable. Last HgA1c 6.6% on 06/03/19. C/w MFM 500 mg BID. Repeat HgA1c. Recommended starting Atorvastatin 40 mg every day but patient wishes to repeat labs first. Will need to clarify last retinopathy screening at next visit.    ? Hypertension, essential 06/21/2020    06/21/2020 Chronic,s table. C/w Benicar 40/25 mg x1/2 tablet every day. Reassess at next in office appointment.    ? Hypothyroidism 06/21/2020   ? Myelopathy concurrent with and due to spinal stenosis of cervical region (HCC/RAF) 06/21/2020    06/21/2020 Radiculopathy and myelopathy symptoms since forehead injury 08/25/19. MRI cervical spine 09/30/19 showing multilevel degenerative changes and central disk/osteophyte protrusion impinging the spinal cord with severe spinal stenosis and severe BL foraminal stenosis. Originally recommended for surgical intervention, which was then cancelled due to abnormal preoperative stress test. Neurosurge   ? OSA (obstructive sleep apnea) 06/21/2020    06/21/2020 Chronic, stable. C/w CPAP at bedtime.        Past Surgical History:   Past Surgical History:   Procedure Laterality Date   ? CESAREAN SECTION      x2   ? CHOLECYSTECTOMY  1987   ? INCISIONAL HERNIA REPAIR  1987       SocHx:   Social History     Socioeconomic History   ? Marital status: Single   Tobacco Use   ? Smoking status: Former Smoker     Packs/day: 0.25     Years: 5.00     Pack years: 1.25     Types: Cigarettes   ? Smokeless tobacco: Never Used   Vaping Use   ? Vaping Use: Never used   Substance and Sexual Activity   ? Alcohol use: Not Currently   ? Drug use: Not Currently       HABITS:       Social History     Tobacco Use   Smoking Status Former Smoker   ? Packs/day: 0.25   ? Years: 5.00   ? Pack years: 1.25   ? Types: Cigarettes   Smokeless Tobacco Never Used        Social History     Substance and Sexual Activity Alcohol Use Not Currently        Social History     Substance and Sexual Activity   Drug Use Not Currently       FamHx:    History reviewed. No pertinent family history.     Allergies:  Iodinated diagnostic agents        Relevant Labs:  Lab Results   Component Value Date    HGBA1C 6.6 (  H) 09/14/2020     Creatinine   Date Value Ref Range Status   09/07/2020 0.68 0.60 - 1.30 mg/dL Final     Lab Results   Component Value Date    WBC 8.78 09/07/2020    HGB 13.5 09/07/2020    HCT 43.2 09/07/2020    MCV 88.7 09/07/2020    PLT 269 09/07/2020       Physical Exam: Historical Exam- Exam limited today, Video Visit      Vital Signs: There were no vitals taken for this visit.  General:  The patient is well developed, well nourished, and in no acute distress, alert with appropriate mood and affect.  She is wearing soft collar neck brace  Eyes: EOMI  Respiratory: Normal work of breathing without apnea and no evidence of respiratory distress without use of accessory muscles.      Imaging and work-up:  Imaging was reviewed personally by me and demonstrate by reporting    Cervical MRI 09/30/2019  ''IMPRESSION -     No significant change.     1. ?Multilevel degenerative changes most pronounced at C5-C6 where there is trace   retrolisthesis with a central disk/osteophyte protrusion impinging the spinal cord with   severe spinal canal stenosis. There is mild facet arthropathy and bilateral   uncovertebral hypertrophy causing severe bilateral foraminal stenosis. This affects the exiting   bilateral C6 nerve roots. This level is unchanged.     2. Reversal of the cervical lordosis suggesting muscle spasm and/or degenerative   changes.    ''  Xray cervical spine  ''IMPRESSION:   Impression:     Degenerative spondylosis most pronounced at C5-6.     No evidence for dynamic instability   ''  Commentary and Medical Decision Making:    Khaylee Mcevoy is a 60 y.o. female who presents to clinic today for evaluation for cervical radicular pain. Assessment:  1. Cervical myeloradiculoapthy    Plan:    Educated patient regarding complexity of neck pain and multidisciplinary approach to managing symptoms.    Diagnostic Imaging:   - Reviewed image report    Medications:   Continue gabapentin and tylenol    Physical therapy:  No cervical manipulation   Continue HEP    Procedures:   - no procedure indicated for now.        Follow-up:  Primary care provider for further management or medication refills and therapy renewals.     Spine Center:with spine surgery and  if symptoms worsen or fail to improve    Advised the patient that if neurological issues were to develop such as weakness, bowel or bladder control, or worsening pain, that they should present immediately to the closest Emergency Room.    Thank you for this consultation; If I can be of further service, please contact my office.    Medical Decision Making: Low Complexity  1) Number and Complexity of Problems Addressed at Encounter:  - 1 chronic condition     2) Data Reviewed and Analyzed today:  - Tests/Documents (3)  - Reviewed prior external notes from referring provider, PCP, or surgeon  - Reviewed prior Xrays  - Reviewed Prior MRI  - Reviewed CBC  -Reviewed CMP  - Reviewed HgA1C      Rhetta Mura, DO  Interventional Spine  [facility]  Dept. Of Orthopedic Surgery

## 2020-12-15 NOTE — Patient Instructions
PLEASE REVIEW INFORMATION BELOW:     ??? For all visits, please be ready to show your photo ID and insurance card(s), and pay any applicable co-pays that will be due at the time of visit.     ??? It is your responsibility to be sure that your health insurance plan covers any lab test, radiology procedure, consultation, orthopaedic procedures which include, but not limit, injections, casting, bracing, durable medical equipment, etc. which has been recommended for you.  Contacting the Office:  ??? Each physician office has a Patient Care Coordinator that will help coordinate your care. They can help with:  ??? General questions  ??? Relay specific questions to the doctor   ??? Schedule surgery  ??? Follow up on authorizations that are pending  ??? Provide paperwork such as a return to work/school letter or disability forms  ??? Please allow 24 hours for messages to be returned from the physician???s office.  ??? There is a 7-10 business day turnaround for all forms.  ??? We encourage you to sign up for [facility] to communicate directly with the office or physician online. Please ask any staff member for additional information.   ??? Sofia: [phone],  [phone]    Following Up with the Physician:  ??? Before leaving, physician will let you know if you will need to follow-up within a certain time. If yes, make sure you schedule your follow-up appointment at the front desk or by calling the centralized scheduling center at:  ???  [phone] Orthopaedic [facility].  ???  [phone] [facility].  ???  [phone] [facility].  ??? Did your physician order any labs, imaging, or tests that should be completed before your next follow-up? If yes, please continue reading below.  Physician Order/Pre-authorization:  ??? For any physician orders/pre-authorizations, please allow 5-7 business days for authorization to be obtained.  Imaging (MRI, X-Ray, CT Scan, etc)  ??? If you are scheduling at a [facility], please call Radiology at [phone] to schedule your imaging. You do not need to wait for an authorization to schedule.   ??? If you are scheduling outside of [facility]:  ??? Notify your physician???s office of your location choice, this will help in obtaining authorization for the correct facility.  ??? Once you complete the imaging, obtain a copy of the report and images. Provide a copy to your physician for review.  ??? Schedule a follow-up appointment to review the images, unless stated otherwise by your physician or the office.  Injections  ??? If the physician recommends an injection, plan anywhere from 1-5 follow-up visits with your physician in a specific timeframe to complete the treatment plan. The follow-up visits can be as frequent as once a week until all the dosage has been administered.  ??? If authorization is required, allow 5-7 business days for the office to obtain authorization.  Lab work  ??? If labs are ordered, keep in mind:  ??? Fasting or non-fasting?  ??? How soon must they be done? (as soon as possible, one week before next appointment, a month before next appointment, etc.)  ??? Do you need to schedule a follow-up visit to discuss results or did the physician say they will call you with the results?   ??? If lab work is completed at a [facility], your results will automatically be sent to the physician, however allow a few days for results to be completed. Some labs require more time than others for results to finalize.  ???   If lab work is completed outside a [facility], bring a copy of the lab orders with you. After a few days, follow-up with the lab to obtain a copy of your lab results for your physician.  Physical Therapy  ??? If physical therapy is recommended, notify the office of where you would prefer to seek therapy.  ??? Before doing so, verify with the physical therapy location that your insurance will be accepted.  ??? If authorization is required, allow 5-7 business days for the office to obtain authorization.  ??? If physical therapy is completed at a  facility, the progress reports will automatically be sent to the physician.  ??? Physical therapy requires multiple visits to your chosen facility, anywhere from 2 weeks and more depending on the nature of your case.  Nerve Conduction/EMG Study  ??? If the physician ordered a Nerve Conduction or EMG Study, contact the Refer to Physician Office to schedule your appointment.      If you develop any severe worsening pain, new weakness or numbness please contact the office or present to the Emergency Department.  If you experience new loss of bowel or bladder you should go to the Emergency Department.

## 2020-12-27 ENCOUNTER — Non-Acute Institutional Stay: Payer: MEDICAID

## 2020-12-28 ENCOUNTER — Ambulatory Visit: Payer: MEDICAID

## 2020-12-28 ENCOUNTER — Non-Acute Institutional Stay: Payer: MEDICAID

## 2020-12-28 NOTE — Treatment Summary
PHYSICAL THERAPY TREATMENT NOTE    PATIENT: Chelsea [name]  GENDER: female  AGE: 60 y.o.  DOB: 1961-01-02  MRN: [id number]    Diagnosis:   No diagnosis found.  HMO:    Visit Number: Visit count could not be calculated. Make sure you are using a visit which is associated with an episode. of 14  Plan of Care Expires: 02/07/2021  Authorization Expires: 03/10/2021    Precautions:   Dizziness and headache  Potential fall risk   spinal compression at cervical region (scheduled fusion surgery in July 2022)    Pain:Yes   Pain In: 3-4/10  Pain Out: 3-4/10  Location: neck cervical spine  Duration: Constant  Description: Aching and Sharp  Aggravating Factors: any neck movement  Alleviating Factors: wearing neck collar and injection  Pain Comments: numbness in bilateral hands    SUBJECTIVE:   ***       OBJECTIVE:     Patient donned neck brace (soft collar) prior to treatment as she notes she didn't want to allow for any neck pain.        Standing double limb balance tandem***    Seated: (utilized tongue depressor with green sticker on end for target)  -smooth pursuit in cross pattern x30 seconds   -saccades: horizontal, vertical, diagonal left/right // increased difficulty with vertical (decreased velocity of eye movement)     Sit to stand 2x10 with cueing for hip hinge/moving center of mass anterior rather than posterior and weight bearing through whole foot     Heel toe rocking x10 // good ankle strategy with intermittent need for upper extremity support     Tandem balance 2x20 seconds with either foot posterior // intermittent upper extremity support     Soleus stretching 2x30 seconds each with cueing for neutral foot alignment rather than excessive abduction of foot     Gait with cueing for heel to toe gait (push off of whole ball of foot rather than medially) to limit tendency for left foot to hit walking, as patient noted she kept stubbing her toes    Patient Education:   Education Content: nature of symptoms and importance of addressing oculomotor impairments after concussion    Education Delivery Method:   verbal, written and demonstration   Education Response:   verbalizes understanding and demonstrates understanding    ASSESSMENT:***Will continue to benefit from balance training, gait training, strengthening, and oculomotor exercises to progress stability and decrease fall risk.       PLAN:  review home program, progress lower extremity strengthening - try straight leg raise, balance, step up, standing marching, progress saccades and smooth pursuit - practice standing     Short term goals: In 4-5 weeks,  ?  Initiate home exercise program.  ?  Patient will be able to improve ankle range of motion 5 deg bilaterally, so she can stand in heel contact for better stability.   ?  Patient will be able to maintain double leg balance without noticeable sway for 30 seconds, so she can improve standing stability and decrease risk of fall.  ?  Long term goals: In 10-12 weeks,  ?  Patient will be able to maintain 3/4 tandem bilaterally for 30 seconds with minimal sway, so she can improve standing stability and decrease risk of fall.   ?  Patient will be able to ambulate with 50% improvement of feeling unsteadiness subjectively while walking using single leg cane, so she can improve confident.  ?  Independent with home exercises.  PTA Communication: All portions of the treatment plan may be delegated to the PTA with the exception of spinal joint mobilizations and grade IV and above peripheral joint mobilizations.     Patient was seen for a total of 25 minutes of treatment time.  23 minutes was in services represented by timed CPT codes.    Miki Kins, PT

## 2020-12-29 NOTE — Telephone Encounter
Pt cancel 11/18/2020 appt

## 2021-01-07 ENCOUNTER — Inpatient Hospital Stay: Payer: MEDICAID

## 2021-01-07 ENCOUNTER — Ambulatory Visit: Payer: MEDICAID | Attending: Student in an Organized Health Care Education/Training Program

## 2021-01-07 ENCOUNTER — Ambulatory Visit: Payer: MEDICAID

## 2021-01-07 DIAGNOSIS — K0889 Other specified disorders of teeth and supporting structures: Secondary | ICD-10-CM

## 2021-01-07 DIAGNOSIS — K047 Periapical abscess without sinus: Secondary | ICD-10-CM

## 2021-01-07 DIAGNOSIS — N950 Postmenopausal bleeding: Secondary | ICD-10-CM

## 2021-01-07 DIAGNOSIS — F419 Anxiety disorder, unspecified: Secondary | ICD-10-CM

## 2021-01-07 DIAGNOSIS — M542 Cervicalgia: Secondary | ICD-10-CM

## 2021-01-07 DIAGNOSIS — I10 Essential (primary) hypertension: Secondary | ICD-10-CM

## 2021-01-07 MED ORDER — LORAZEPAM 0.5 MG PO TABS
.5 mg | ORAL_TABLET | Freq: Every day | ORAL | 0 refills | Status: AC | PRN
Start: 2021-01-07 — End: ?

## 2021-01-07 MED ORDER — AMOXICILLIN-POT CLAVULANATE 875-125 MG PO TABS
1 | ORAL_TABLET | Freq: Two times a day (BID) | ORAL | 0 refills | Status: AC
Start: 2021-01-07 — End: ?

## 2021-01-07 MED ADMIN — LORAZEPAM 0.5 MG PO TABS: .5 mg | ORAL | @ 22:00:00 | Stop: 2021-01-07 | NDC 00904600761

## 2021-01-07 NOTE — Consults
INFECTIOUS DISEASES OUTPATIENT CONSULTATION INITIAL NOTE    DATE OF SERVICE:  01/07/2021  CONSULTING PROVIDER: Pete Glatter. Bretta Bang, MD, PhD  Patient Identifiers:   PATIENT:  Chelsea [name]  MRN:  [id number]  DOB:  18-Feb-1961  Treatment Team:   REFERRING PRACTITIONER: Vergia Alberts., NP  PRIMARY CARE PROVIDER: Clint Lipps, DO  CURRENT ATTENDING PROVIDER: Raleigh Nation., MD*  ADDITIONAL CONSULTANTS: Patient Care Team:  Clint Lipps, DO as PCP - General (Family Medicine)  Clint Lipps, DO as PCP - Plan Assigned [facility] Medicine)  Reason for Consultation:   REASON FOR CONSULTATION: Pre-operative evaluation  Chief Complaint:   CHIEF COMPLAINT:   Chief Complaint   Patient presents with   ? New Consult      Assessment:   Chelsea [name] is a 60 y.o. old female with:    Primary Diagnosis:  Dental infection [K04.7]  Active Problems Identified:    ICD-10-CM    1. Dental infection  K04.7 XR mandible 4 views or more complete     XR facial bones 3 views or more complete     XR maxilla     CBC & Auto Differential     Comprehensive Metabolic Panel     Sedimentation Rate, Erythrocyte     C-Reactive Protein   2. Tooth pain  K08.89 XR mandible 4 views or more complete     XR facial bones 3 views or more complete     XR maxilla     CBC & Auto Differential     Comprehensive Metabolic Panel     Sedimentation Rate, Erythrocyte     C-Reactive Protein     Immune state:  HIV negative 06/29/20    Impression: No clinical evidence mandibular/maxillary osteomyelitis     Discussion:   There is a need for evidence to confirm effectiveness or need, however, pre-operative dental evaluation prior to spinal and/or orthopaedic surgery appears to be a general practice that has been encouraged by some (reasonable).    Ref: DOI: 10.1002/[id number]  Ref: doi: [email or web address]/[email or web address].2020.03.03  Ref: doi: [email or web address]   Ref: DOI: 10.1016/j.otsr.2019.02.024  Ref: DOI: 10.1136/bmj.309.6953.506    Recommendations: Diagnostic Studies:  As requested below    Therapeutic Interventions:  Rx for Augmentin, to be taken at time of dental extraction (will need to review plans for how this is to be executed)    Consultation Assistance:  Followed by NSGY, Dental team    Orders placed this encounter:  (also includes medications added during medicine reconciliation and/or newly ordered medicines)  Orders Placed This Encounter   ? XR mandible 4 views or more complete   ? XR facial bones 3 views or more complete   ? XR maxilla   ? CBC & Auto Differential   ? Comprehensive Metabolic Panel   ? Sedimentation Rate, Erythrocyte   ? C-Reactive Protein   ? amoxicillin-clavulanate 875-125 mg tablet     Counseling and advice:  As above and below  There are no Patient Instructions on file for this visit.  Return to clinic:  Return precautions given.  No follow-ups on file.  Future Appointments   Date Time Provider Department Center   01/12/2021  9:40 AM Theodoro Grist, Denyse Dago., MD CARD [id number] MEDICINE   01/17/2021 10:30 AM Bobbe Medico., MD [facility] NEUROSURGERY   02/01/2021  9:00 AM Mayo, Pincus Sanes., MD NEUSM[facility] SM NEUROLOGY     Discussed case with: the patient  Thank you for this consultation.  Final recommendations will also be communicated  to the requesting physician via the shared medical record, facsimile, or letter via [place] mail.   The remainder of this note contains subjective and objective information documented in support of the conclusions and plans listed above.  Subjective:   History obtained from chart review and the patient.   History of Present Illness:   Chelsea [name] is a 60 y.o. old female with medical history as noted below    Agenda for today's appointment:  ---Patient goals:  Determine best approach for management of odontogenic infection  ---Physician goals:  Assist with perioperative planning    History:  Initial SBP checked was significantly elevated (systolic in 200s), repeat check with SBP in 170's  She reports she felt a bit dizzy and light headed, but this has resolved.   She had some coffee this morning, and this it usually elevates her blood pressure    There are upcoming plans for surgical correction of cervical spine disease (02/09/21), during which time it is likely that hardware will be placed.  As part of her perioperative evaluation, she has been seen by the dentist, and she was told she needs tooth extractions (initially 6, now 4 - it's possible that two can be saved with root canal and cap).  Additional assistance has been requested from ID, to assess with perioperative optimization with respect to infection    She has not had fever or chills.  She has not had pain in her mouth or trouble with chewing.  A broken tooth prompted a visit to the dentist (left lower).  She has not had unintentional weight loss or night sweats.  She has not had cough or dyspnea, denies dysuria, nausea, vomiting, or diarrhea. She has not had open drainage or open wounds.     She had a tooth extraction ~15 years ago, no complications related to this.     Regarding her neck, she has primarily had pain involving her lower neck, bilateral shoulders/shoulder blades, numbness in hands.  She also has pain in her hips, numbness in her feet.     Her last injection for her neck was ~ 2 months ago. She uses Tylenol daily, and at times needs gabapentin to assist with symptom control.     Pertinent Antimicrobial History/Courses:   None noted    Pertinent Immunosuppression Medication History/Courses:   None noted    Current Problems Identified:  Patient Active Problem List   Diagnosis   ? Diabetes mellitus (HCC/RAF)   ? Abnormal cardiovascular stress test   ? OSA (obstructive sleep apnea)   ? Myelopathy concurrent with and due to spinal stenosis of cervical region (HCC/RAF)   ? Hypertension, essential   ? Hypothyroidism   ? Morbid (severe) obesity due to excess calories (HCC/RAF)   ? Candidate for statin therapy due to risk of future cardiovascular event   ? Abnormal finding on MRI of brain   ? Fibroid   ? Balance disorder     Past Medical History:   Past Medical History:  Past Medical History:   Diagnosis Date   ? Abnormal cardiovascular stress test 06/21/2020    06/21/2020 Preop exam showed abnormal stress test with no EKG e/o ischemia but e/o large anterior wall ischemia with recommendation for cardiac catheterization. Had second opinion with repeat exercise nuclear stress test showing no e/o ischemia and was recommended against cardiac catheterization. I recommended starting Atorvastatin 40 mg every day for her DM2, but patient wishes to repeat labs fir   ? Diabetes mellitus (HCC/RAF)  02/20/2017    06/21/2020 Chronic, stable. Last HgA1c 6.6% on 06/03/19. C/w MFM 500 mg BID. Repeat HgA1c. Recommended starting Atorvastatin 40 mg every day but patient wishes to repeat labs first. Will need to clarify last retinopathy screening at next visit.    ? Hypertension, essential 06/21/2020    06/21/2020 Chronic,s table. C/w Benicar 40/25 mg x1/2 tablet every day. Reassess at next in office appointment.    ? Hypothyroidism 06/21/2020   ? Myelopathy concurrent with and due to spinal stenosis of cervical region (HCC/RAF) 06/21/2020    06/21/2020 Radiculopathy and myelopathy symptoms since forehead injury 08/25/19. MRI cervical spine 09/30/19 showing multilevel degenerative changes and central disk/osteophyte protrusion impinging the spinal cord with severe spinal stenosis and severe BL foraminal stenosis. Originally recommended for surgical intervention, which was then cancelled due to abnormal preoperative stress test. Neurosurge   ? OSA (obstructive sleep apnea) 06/21/2020    06/21/2020 Chronic, stable. C/w CPAP at bedtime.      Past Surgical History:   Past Surgical History:  Past Surgical History:   Procedure Laterality Date   ? CESAREAN SECTION      x2   ? CHOLECYSTECTOMY  1987   ? INCISIONAL HERNIA REPAIR  1987     Family History:   Family History:  family history is not on file. Potential Sick Contacts:  ---Nuclear Family:  ---Extended Family:  Tuberculosis:  COVID-19:  No known family history of infectious diseases, recurrent infections, or known immunodeficiencies.   Social History:   Social History:   reports that she has quit smoking. Her smoking use included cigarettes. She has a 1.25 pack-year smoking history. She has never used smokeless tobacco. She reports previous alcohol use. She reports previous drug use.   Additional History:  ? Residence:  ---Place of Birth: [place]    ? Identifications:  ---Gender, if different from biological sex: female  ---Race: Black  ---Ethnicity: Not Hispanic or Latino  ---Faith, if any: Christian    ? Sexual Health:  ---Partners: In a relationship (female partner) for 3 years - present on the phone during the visit    Spiritual/Faith History:  Faith identified: Christian    Allergies:   All:   Allergies   Allergen Reactions   ? Iodinated Diagnostic Agents Anaphylaxis     Prior Immunizations:   Prior Documented Immunizations  Immunization History   Administered Date(s) Administered   ? COVID-19, mRNA, (Pfizer - Purple Cap) 30 mcg/0.3 mL 11/12/2019, 12/03/2019, 07/06/2020   ? Tdap 12/25/2007     Laboratory Testing  Vaccine Prevention Serology  No recorded results at [facility]    HIV Status  Results for orders placed or performed in visit on 06/29/20   HIV-1/2 Ag/Ab 4th Generation with Reflex Confirmation   Result Value Ref Range    HIV-1/2 Ag/Ab Screen 4th Generation Nonreactive Nonreactive    Narrative    Ingestion of high levels of biotin in dietary supplements may lead to falsely negative results.       CMV status:   No recorded results at [facility]    Peripheral Cells  Absolute Neut Count   Date/Time Value Ref Range Status   09/07/2020 01:13 PM 5.90 1.80 - 6.90 x10E3/uL Final   06/29/2020 08:19 AM 3.78 1.80 - 6.90 x10E3/uL Final     Absolute Eos Count   Date/Time Value Ref Range Status   09/07/2020 01:13 PM 0.10 0.00 - 0.50 x10E3/uL Final   06/29/2020 08:19 AM 0.11 0.00 - 0.50 x10E3/uL Final  Absolute Lymphocyte Count   Date/Time Value Ref Range Status   09/07/2020 01:13 PM 2.26 1.30 - 3.40 x10E3/uL Final   06/29/2020 08:19 AM 1.85 1.30 - 3.40 x10E3/uL Final     Lab Results   Component Value Date    WBC 8.78 09/07/2020    PLT 269 09/07/2020     Peripheral Component Levels  Serum Immunoglobulins  No recorded results at [facility]    Inflammatory Markers  No recorded results at [facility]    Functional Assays  No recorded results at [facility]    Current Medications:     Outpatient Medications Prior to Visit   Medication Sig   ? atorvastatin 10 mg tablet Take 1 tablet (10 mg total) by mouth daily.   ? ciclopirox 8% solution Apply topically at bedtime Apply to adjacent skin and affected nails daily. Remove with alcohol every 7 days. Use up to 48 weeks..   ? clobetasol 0.05% ointment Apply topically two (2) times daily APPLY AND GENTLY MASSAGE INTO AFFECTED AREA(S) TWICE DAILY. Do not apply to face, armpit, or groin..   ? clobetasol 0.05% ointment Apply topically two (2) times daily APPLY AND GENTLY MASSAGE INTO AFFECTED AREA(S) TWICE DAILY. Do not apply to face, armpit, or groin..   ? clobetasol 0.05% ointment Apply topically two (2) times daily APPLY AND GENTLY MASSAGE INTO AFFECTED AREA(S) TWICE DAILY. Do not apply to face, armpit, or groin..   ? diclofenac Sodium 1% gel apply 2 to 3 gram twice a day if needed   ? diphenhydrAMINE 50 mg capsule Take 50 mg benadryl by mouth 1 hour prior to contrast administration.   ? gabapentin (NEURONTIN) 100 mg capsule Take 1 capsule (100 mg total) by mouth three (3) times daily.   ? gabapentin 100 mg capsule .   ? gabapentin 300 mg capsule take 1 capsule by mouth five times a day   ? hydrocortisone 1% cream Apply topically.   ? levothyroxine 100 mcg tablet take 1 tablet by mouth every morning before breakfast.   ? LORazepam 0.5 mg tablet 1-2 tabs PO 30 minutes before your procedure.   ? medroxyPROGESTERone 10 mg tablet Take 1 tablet (10 mg total) by mouth daily.   ? methylPREDNISolone 32 mg tablet Take Methylprednisolone 32 mg x1 tablet 12 hours prior to contrast administration then take Methylprednisolone 32 mg x1 tablet 2 hours prior to contrast administration..   ? olmesartan-hydroCHLOROthiazide 40-25 mg tablet Take 0.5 tablets by mouth daily.   ? RA ASPIRIN EC 81 MG EC tablet take 1 tablet by mouth once daily     No facility-administered medications prior to visit.      Current Medications       Disp Refills Start End    atorvastatin 10 mg tablet 90 tablet 1 09/22/2020     Take 1 tablet (10 mg total) by mouth daily. - Oral    ciclopirox 8% solution 6.6 mL 3 07/01/2020     Apply topically at bedtime Apply to adjacent skin and affected nails daily. Remove with alcohol every 7 days. Use up to 48 weeks.. - Topical    clobetasol 0.05% ointment 60 g 1 08/20/2020     Apply topically two (2) times daily APPLY AND GENTLY MASSAGE INTO AFFECTED AREA(S) TWICE DAILY. Do not apply to face, armpit, or groin.. - Topical    clobetasol 0.05% ointment 60 g 4 12/01/2020     Apply topically two (2) times daily APPLY AND GENTLY MASSAGE INTO AFFECTED AREA(S) TWICE DAILY.  Do not apply to face, armpit, or groin.. - Topical    clobetasol 0.05% ointment 60 g 1 12/01/2020     Apply topically two (2) times daily APPLY AND GENTLY MASSAGE INTO AFFECTED AREA(S) TWICE DAILY. Do not apply to face, armpit, or groin.. - Topical    diclofenac Sodium 1% gel   04/02/2020     apply 2 to 3 gram twice a day if needed    diphenhydrAMINE 50 mg capsule 1 capsule 0 11/22/2020     Take 50 mg benadryl by mouth 1 hour prior to contrast administration.    gabapentin (NEURONTIN) 100 mg capsule 90 capsule 3 10/14/2020 10/14/2021    Take 1 capsule (100 mg total) by mouth three (3) times daily. - Oral    gabapentin 100 mg capsule   06/17/2020     .    gabapentin 300 mg capsule   05/24/2020     take 1 capsule by mouth five times a day    hydrocortisone 1% cream   05/12/2020     Apply topically. - Topical levothyroxine 100 mcg tablet 90 tablet 1 09/09/2020     take 1 tablet by mouth every morning before breakfast.    LORazepam 0.5 mg tablet 2 tablet 0 10/28/2020     1-2 tabs PO 30 minutes before your procedure.    medroxyPROGESTERone 10 mg tablet 90 tablet 3 08/31/2020     Take 1 tablet (10 mg total) by mouth daily. - Oral    methylPREDNISolone 32 mg tablet 2 tablet 0 11/22/2020     Take Methylprednisolone 32 mg x1 tablet 12 hours prior to contrast administration then take Methylprednisolone 32 mg x1 tablet 2 hours prior to contrast administration.Marland Kitchen    olmesartan-hydroCHLOROthiazide 40-25 mg tablet 90 tablet 1 09/09/2020     Take 0.5 tablets by mouth daily. - Oral    RA ASPIRIN EC 81 MG EC tablet 90 tablet 1 12/28/2020     take 1 tablet by mouth once daily          Pharmacy     [facility] [phone] - [place], [place] - [address]          Outpatient Medications Marked as Taking for the 01/07/21 encounter (Office Visit) with Raleigh Nation., MD, PhD   Medication   ? atorvastatin 10 mg tablet   ? ciclopirox 8% solution   ? clobetasol 0.05% ointment   ? clobetasol 0.05% ointment   ? clobetasol 0.05% ointment   ? diclofenac Sodium 1% gel   ? diphenhydrAMINE 50 mg capsule   ? gabapentin (NEURONTIN) 100 mg capsule   ? gabapentin 100 mg capsule   ? gabapentin 300 mg capsule   ? hydrocortisone 1% cream   ? levothyroxine 100 mcg tablet   ? LORazepam 0.5 mg tablet   ? medroxyPROGESTERone 10 mg tablet   ? methylPREDNISolone 32 mg tablet   ? olmesartan-hydroCHLOROthiazide 40-25 mg tablet   ? RA ASPIRIN EC 81 MG EC tablet     Potential Drug Interactions:  Cardiac: Atorvastatin    Drug Metabolism Check:  Renal:    GFR Estimate for African American   Date/Time Value Ref Range Status   09/07/2020 01:13 PM >89 See GFR Additional Information mL/min/1.15m2 Final   Liver:  No clinical evidence of liver disease     Anti-Infectives:  None currently    Review of Systems:   Review of Systems:  Pertinent positives and negatives noted above Objective:  Vital Signs:   BP 178/92  ~ Pulse 72  ~ Temp 36.1 ?C (96.9 ?F) (Forehead)  ~ Resp 18  ~ Ht 5' (1.524 m)  ~ Wt (!) 256 lb 1.6 oz (116.2 kg)  ~ HC 62'' (157.5 cm)  ~ BMI 50.02 kg/m?   Repeat: 143/78, 62  Weight:   Vitals:    01/07/21 1000   Weight: (!) 256 lb 1.6 oz (116.2 kg)   Height: 5' (1.524 m)      Wt Readings from Last 3 Encounters:   01/07/21 (!) 256 lb 1.6 oz (116.2 kg)   11/22/20 (!) 258 lb (117 kg)   11/08/20 (!) 258 lb 6.4 oz (117.2 kg)     BMI: Body mass index is 50.02 kg/m?Marland Kitchen  Physical Exam:      Televisit? [x]  No  []  Yes, video visit, physical exam is limited.  []  Yes, telephone visit, physical exam not performed.  Chaperone Present? [x]  No  []  Yes  If present, indicate name/title of chaperone present:  []  Additional comments:    System New symptoms? Check if examined and normal. Additional (+) or (-) Findings System New symptoms? Check if examined and normal. Additional (+) or (-) Findings   Constitutional  []  Yes    [x]  No   []  Not discussed [x]  General appearance  []  Other:   []  Not performed  Lymph  []  Yes    [x]  No   []  Not discussed [x]  Head  [x]  Neck []  Axillae []  Groin  []  Other:   []  Not performed    Head  []  Yes    [x]  No   []  Not discussed [x]  Normocephalic/Atraumatic []  Sinuses  []  Other:   []  Not performed  Neck/  Endocrine [x]  Yes    []  No  []  Not discussed []  Inspection/Palpation []  Thyroid []  Trachea    []  Other:   [x]  Not performed    Eyes  []  Yes    [x]  No   []  Not discussed [x]  Conjunctiva/Lids  []  Pupils    []  Fundi   []  Sclera  []  Other:   []  Not performed  Neuro  [x]  Yes    []  No  []  Not discussed []  Mental Status []  Cranial Nerves   []  Reflexes  []  Sensation   []  Motor  []  Other:   [x]  Not performed    ENMT  [x]  Yes    []  No   []  Not discussed   []  External Ears []  Otoscopy []  Hearing  []  Nares/Nasal mucosa [x]  Oral mucosa  []  Lips/Teeth/Gums [x]  Oropharynx  []  Other:   []  Not performed Dentition in varying state of repair, without drainage, No pain palpating along mandible or maxilla.  Gastrointestinal []  Yes    [x]  No  []  Not discussed []  Appearance []  Ascultation []  Percussion    [x]  No Palpation for pain/rigidity []  Liver/Spleen   []  Presence of masses []  Rectal     []  Other:   []  Not performed    Respiratory  []  Yes    []  No   [x]  Not discussed [x]  Effort  []  Percussion [x]  Auscultation  []  Other:   []  Not performed  Breast and/or Chest []  Yes    []  No  [x]  Not discussed []  Inspection   []  Palpation  []  Other:   [x]  Not performed    Cardiovascular  []  Yes    []  No   []  Not discussed      [x]  Auscultation (Heart  Sounds)  []  Auscultation (Arteries);    Specify Site:  Palpation:     Edema? [x]  No []  Yes; []  Abdominal Aorta    Pulses:  (Bilateral unless stated otherwise)  []  Carotid   []  Brachial  []  Radial  []  Femoral  []  Popliteal  []  Dorsalis Pedis  []  Posterior Tibial   []  Other:   []  Not performed   Psych []  Yes    []  No  [x]  Not discussed []  Level of Consciousness  []  Orientation  []  Attention, Concentration  []  Visuospacial Perception  []  Insight/Judgment   []  Cognition, Memory, Calculations   []  Language,Thought Process/Content  []  Mood/Affect, Behavior  []  Praxis, Scientist, water quality  []  Other:   [x]  Not performed    MSK  [x]  Yes    []  No   []  Not discussed   []  Gait []  Digits/nails  Specify site examined:   []  Inspect/palpation []  ROM  []  Stability []  Strength/tone  []  Other:   [x]  Not performed  Genitourinary []  Yes    []  No  [x]  Not discussed []  CVA tenderness present   []  Suprapubic tenderness present  []  External genitalia  Specify Organs examined:   []  Other:   [x]  Not performed    Skin  []  Yes    [x]  No   []  Not discussed []  Inspection []  Palpation  []  Other:   [x]  Not performed  Other  []  Yes    []  No      [x]  Not discussed  Specify site examined:   []  Other:   [x]  Not performed    Extremities  []  Yes    []  No   [x]  Not discussed [x]  No clubbing, cyanosis, edema   []  Other:   []  Not performed   Device, Drain, Catheter, or indwelling hardware present?  []  Yes    [x]  No    []  Specify type, location:   []  Additional comments        Lab Tests/Studies:     Clinical Laboratory Data   CBC:  Lab Results   Component Value Date    WBC 8.78 09/07/2020    HGB 13.5 09/07/2020    HCT 43.2 09/07/2020    MCV 88.7 09/07/2020    PLT 269 09/07/2020     RBC Indices:  Lab Results   Component Value Date    RBC 4.87 09/07/2020    NUCRBC 0.0 09/07/2020    MCV 88.7 09/07/2020    MCH 27.7 09/07/2020    MCHC 31.3 (L) 09/07/2020     Differential (automated):  Lab Results   Component Value Date    NEUTABS 5.90 09/07/2020    MONOABS 0.62 09/07/2020    EOSABS 0.10 09/07/2020    BASOABS 0.05 09/07/2020    LYMPHPCT 25.2 09/07/2020    MONOPCT 6.9 09/07/2020    EOSPCT 1.1 09/07/2020    BASOPCT 0.6 09/07/2020     Chemistries:  Lab Results   Component Value Date    NA 140 09/07/2020    K 4.0 09/07/2020    CL 101 09/07/2020    CO2 29 09/07/2020    ANIONGAP 10 09/07/2020    BUN 18 09/07/2020    CREAT 0.68 09/07/2020    GLUCOSE 82 09/07/2020    CALCIUM 9.6 09/07/2020       GFR Estimate for African American   Date/Time Value Ref Range Status   09/07/2020 01:13 PM >89 See GFR Additional Information mL/min/1.76m2 Final     Liver Tests:  Lab Results   Component Value Date    TOTPRO 7.7 09/07/2020    ALBUMIN 4.0 09/07/2020    AST 23 09/07/2020    ALT 23 09/07/2020    ALKPHOS 87 09/07/2020    BILITOT 0.3 09/07/2020       Microbiology Laboratory Data   Microbiology:     Prior:  11/01/20 COVID-19 PCR (MT) neg    Care Everywhere Review:    Serology:  Surrogate Markers  No recorded results at [facility]    Immune Response  HCV:   HCV Ab Screen   Date Value Ref Range Status   06/29/2020 Nonreactive Nonreactive Final     RPR:   RPR   Date Value Ref Range Status   09/07/2020 [facility] Final     Additional Data; Imaging Studies/Record Review/Other:   Imaging:      Additional Studies:     Pathology:     Outside Records Reviewed:     Data Review:   Lab test(s) reviewed: as noted above    Total time spent counseling patient: >50% of total visit time  Total time of visit: 30 minutes    Author:  Pete Glatter. Bretta Bang, MD, PhD 01/07/2021 10:19 AM  ATTENDING PHYSICIAN  [place] INFECTIOUS DISEASES    BILLING ADDENDUM (TIME):  On the day of service I spent 40 minutes for the items checked below:  []  Preparing to see the patient (e.g., review of tests)  []  Obtaining and/or reviewing separately obtained history  []  Performing a medically appropriate examination and/or evaluation  []  Counseling and educating the patient/family/caregiver  []  Ordering medications, tests, or procedures  []  Referring and communicating with other healthcare professionals (when not separately reported)  []  Documenting clinical information in the EHR  []  Independently interpreting results and communicating results to patient/family/caregiver    Author:  Pete Glatter. Bretta Bang, MD, PhD 01/07/2021 9:06 PM

## 2021-01-07 NOTE — Progress Notes
INTERNAL MEDICINE RESIDENT CLINIC PROGRESS NOTE    PATIENT: Chelsea [name]  MRN: [id number]  DOB: [birth year]  DATE OF SERVICE: 01/07/2021    CHIEF COMPLAINT:   Chief Complaint   Patient presents with   ? Hypertension   ? Dizziness        HPI   Chelsea [name] is a 60 y.o. female presents for   Chief Complaint   Patient presents with   ? Hypertension   ? Dizziness     Hx DM, HTN, hypothyroidism, cervical spinal myelopathy, OSA  Patient here for hypertension. Was with infectious disease appointment found to have blood pressure 210/190, on repeat it was 150 systolic. Patient asymptomatic throughout this time but reports high levels of stress and caffeine intake today. Previously her blood pressures have been under control with systolics in 120s, occasionally in 130s. Compliant with her CPAP machine.  On vitals here blood pressure 123/66 after sitting down after 5 minutes.  Patient extremely anxious during visit, requesting lorazepam. Per her report, had to be wheeled in for the visit because of her panic/anxiety.       MEDS     Medications that the patient states to be currently taking   Medication Sig   ? diphenhydrAMINE 50 mg capsule Take 50 mg benadryl by mouth 1 hour prior to contrast administration.   ? gabapentin 100 mg capsule .   ? levothyroxine 100 mcg tablet take 1 tablet by mouth every morning before breakfast.   ? olmesartan-hydroCHLOROthiazide 40-25 mg tablet Take 0.5 tablets by mouth daily.   ? RA ASPIRIN EC 81 MG EC tablet take 1 tablet by mouth once daily     Current Facility-Administered Medications for the 01/07/21 encounter (Office Visit) with Michael Litter., MD   Medication   ? [COMPLETED] LORazepam tab 0.5 mg       PHYSICAL EXAM      Last Recorded Vital Signs:    01/07/21 1340   BP: 122/63   Pulse: 60   Resp:    Temp:    SpO2:      Body mass index is 46.82 kg/m?Marland Kitchen    System Check if normal Positive or additional negative findings   Constit  [x]  General appearance  anxious, requesting lights off   Eyes  []  Conj/Lids []  Pupils  []  Fundi     HENMT  []  External ears/nose []  Otoscopy   []  Gross Hearing []  Nasal mucosa   []  Lips/teeth/gums []  Oropharynx    []  mucus membranes []  Head     Neck  []  Inspection/palpation []  Thyroid     Resp  [x]  Effort [x]  Wheezing    []  Auscultation  []  Crackles     CV  [x]  Rhythm/rate   [x]  Murmurs   []  Gladden   []  JVP non-elevated    Normal pulses:   [x]  Radial []  Femoral  []  Pedal     Breast  []  Inspection []  Palpation     GI  []  abd masses    []  tenderness   []  rebound/guarding   []  Liver/spleen []  Rectal     GU  M: []  Scrotum []  Penis []  Prostate   F:  []  External []  vaginal wall        []  Cervix  []  mucus        []  Uterus    []  Adnexa      Lymph  []  Neck []  Axillae []  Groin     MSK Specify site examined:    []   Inspect/palp []  ROM   []  Stability []  Strength/tone         Skin  []  Inspection []  Palpation     Neuro  []  CN2-12 intact grossly   [x]  Alert and oriented   []  DTR      []  Muscle strength      []  Sensation   []  Gait/balance     Psych  []  Insight/judgement     []  Mood/affect    []  Gross cognition        LABS/STUDIES   I have:   []  Reviewed/ordered []  1 [x]  2 []  ? 3 unique laboratory, radiology, and/or diagnostic tests noted below    []  Reviewed [x]  1 []  2 []  ? 3 prior external notes and incorporated into patient assessment    []  Discussed management or test interpretation with external provider(s) as noted       Lab Studies:  CBC:   Results for orders placed or performed in visit on 01/07/21   CBC   Result Value Ref Range    White Blood Cell Count 7.52 4.16 - 9.95 x10E3/uL    Red Blood Cell Count 5.04 3.96 - 5.09 x10E6/uL    Hemoglobin 14.7 11.6 - 15.2 g/dL    Hematocrit 95.6 (H) 34.9 - 45.2 %    Mean Corpuscular Volume 91.5 79.3 - 98.6 fL    Mean Corpuscular Hemoglobin 29.2 26.4 - 33.4 pg    MCH Concentration 31.9 31.5 - 35.5 g/dL    Red Cell Distribution Width-SD 42.5 36.9 - 48.3 fL    Red Cell Distribution Width-CV 12.7 11.1 - 15.5 %    Platelet Count, Auto 288 143 - 398 x10E3/uL Mean Platelet Volume 10.1 9.3 - 13.0 fL    Nucleated RBC%, automated 0.0 No Ref. Range %    Absolute Nucleated RBC Count 0.00 0.00 - 0.00 x10E3/uL    Neutrophil Abs (Prelim) 5.09 See Absolute Neut Ct. x10E3/uL   Differential, Automated   Result Value Ref Range    Neutrophil Percent, Auto 67.7 No Ref. Range %    Lymphocyte Percent, Auto 23.5 No Ref. Range %    Monocyte Percent, Auto 6.1 No Ref. Range %    Eosinophil Percent, Auto 1.7 No Ref. Range %    Basophil Percent, Auto 0.7 No Ref. Range %    Immature Granulocytes% 0.3 No Reference Range %    Absolute Neut Count 5.09 1.80 - 6.90 x10E3/uL    Absolute Lymphocyte Count 1.77 1.30 - 3.40 x10E3/uL    Absolute Mono Count 0.46 0.20 - 0.80 x10E3/uL    Absolute Eos Count 0.13 0.00 - 0.50 x10E3/uL    Absolute Baso Count 0.05 0.00 - 0.10 x10E3/uL    Absolute Immature Gran Count 0.02 0.00 - 0.04 x10E3/uL   CBC & Auto Differential    Narrative    The following orders were created for panel order CBC & Auto Differential.  Procedure                               Abnormality         Status                     ---------                               -----------         ------  MVH[[id number]]                          Abnormal            Final result               Differential, Automated[[id number]]                          Final result                 Please view results for these tests on the individual orders.       CMP:   Results for orders placed or performed in visit on 01/07/21   Comprehensive Metabolic Panel   Result Value Ref Range    Sodium 140 135 - 146 mmol/L    Potassium 3.7 3.6 - 5.3 mmol/L    Chloride 99 96 - 106 mmol/L    Total CO2 31 (H) 20 - 30 mmol/L    Anion Gap 10 8 - 19 mmol/L    Glucose 153 (H) 65 - 99 mg/dL    Creatinine 8.41 3.24 - 1.30 mg/dL    Estimated GFR >40 See GFR Additional Information mL/min/1.44m2    GFR Additional Information See Comment     Urea Nitrogen 11 7 - 22 mg/dL    Calcium 10.2 (H) 8.6 - 10.4 mg/dL    Total Protein 7.6 6.1 - 8.2 g/dL    Albumin 4.3 3.9 - 5.0 g/dL    Bilirubin,Total 0.3 0.1 - 1.2 mg/dL    Alkaline Phosphatase 88 37 - 113 U/L    Aspartate Aminotransferase 18 13 - 62 U/L    Alanine Aminotransferase 18 8 - 70 U/L          A&P   Xavia Kniskern is a a 60 y.o. female presenting for   Chief Complaint   Patient presents with   ? Hypertension   ? Dizziness         PROBLEM & ORDERS    ICD-10-CM    1. Hypertension, unspecified type  I10    2. Postmenopausal bleeding  N95.0 LORazepam 0.5 mg tablet   3. Anxiety  F41.9 LORazepam tab 0.5 mg       ASSESSMENT  #Hypertension  #anxiety  #isolated elevated blood pressure  Patient with isolated elevated measurement in ID clinic. However on repeat measurements in calm environment in clinic patient has been normotensive 120s systolics. Patient reporting extreme levels of anxiety + panic attack earlier this morning.   - advised medication compliance  - pt to get blood pressure cuff and to keep track of BP and heart rate daily  - pt to bring log back in 2 weeks with PCP visit  - continue benicar as prescribed, no adjustments made pending blood pressure log  - one time lorazepam 0.5mg  PO given in clinic for panic attack         Orders Placed This Encounter   ? LORazepam tab 0.5 mg   ? LORazepam 0.5 mg tablet     Sig: Take 1 tablet (0.5 mg total) by mouth daily as needed for Anxiety. Max Daily Amount: 0.5 mg     Dispense:  2 tablet     Refill:  0     The above recommendation were discussed with the patient.  The patient has all questions answered satisfactorily and is in agreement with this recommended plan of care.  I have discussed and staffed with attending Dr. Kerry Hough who is in agreement with assessment and plan as above.    Return in about 2 weeks (around 01/21/2021).     Author:  Daiva Eves I. Arayah Krouse 01/07/2021 3:02 PM

## 2021-01-07 NOTE — Patient Instructions
A note from your doctor:    Thank you for choosing [facility] for your care.    Our plan, as discussed today:    Your blood pressure was normal here   Please take benicar daily as prescribed  Get a blood pressure cuff and measure it daily as discussed in clinic. Carry a log and bring it with you to clinic in 2 weeks  b    Your return appointment should be scheduled for No follow-ups on file.      ----------------------------------------------------------------------------------------------------------------------    Frequently asked questions:    1. How do I follow through on the plan from my office visit?    Written instructions/advice: Stop in our Check-Out room in the clinic before walking out of the clinic to the waiting room. They will print out any written advice from your visit on an ?After Visit Summary.?    Laboratory tests: You may show up to any  laboratory and provide your name and date of birth and they will be able to find the orders for the lab tests:    In our clinic ([address], [address]):  Monday to Friday (7:30 am to 5:00 pm) -- these hours may be affected based on staffing  Main laboratory ([address], [address]):   Monday to Friday (6:00 am to 7:00 pm)   Weekends and holidays (7:00 am to 3:30 pm) except thanksgiving, christmas, and new years eve  [address] ([address], [address])  Monday to Friday (8:00 am to 5:00 pm).  On request, we can send to outside labs (e.g. [facility] or [facility])    Imaging studies: General X-Ray can be done same-day on the first floor of our building. Advanced imaging and diagnostic studies generally require insurance review and approval prior to scheduling. The Check-Out staff will provide information for scheduling.    Referrals: Stop in the Check-Out room; the staff will verify that the referral has been placed. They will inform you if you can schedule your appointment immediately or if you need to wait for insurance authorization. They will also provide information for scheduling.    Follow-up: Stop in the Check-Out room and the staff will schedule your follow-up visit. If you prefer to schedule at a later time, you can call our Call Center at [phone] or request an appointment online through [facility].  If you have seen someone other than your primary care provider for an urgent visit, it is okay to schedule your follow-up with either the doctor who saw you for urgent care or your primary care physician.     Outside records requests: If your doctor has indicated that outside records should be requested, please sign the form ?Authorization for Release of Health Information? at the Check-Out before you leave. We cannot request your personal health records from other doctors/hospitals without your written permission.    2. How do I get my test results from the visit?    If you sign up for [facility] online ([email or web address]), we will release test results online with comments once your test results are available, usually within 1 week. Some specialized test results may take several weeks.  If you are not signed up for [facility] when your results become available, we will send your test results by letter within 1-2 weeks of your visit.    If an urgent test is ordered in your visit, such as an X-ray to look for a broken bone, our team will give  you a call to make sure that you are aware of the results.  If another doctor orders a test for you, it is best to speak first with that doctor directly about the result.  Please contact our office if you have not received a result in the expected time frame.  Please make sure your address and phone number are up-to-date in our system when you check in. We use these to contact you about important health information, including test results.    3. Can I ask questions after the visit?    Yes! It is important to let us know if you have any trouble with the treatment plan that we agree upon or if your symptom(s) are not improving as expected. For non-emergency contact, you may reach your doctor two ways:    Online: send non-urgent medical questions through the [facility] website ([email or web address]). These are usually returned within 2 business days.  Call the [facility] Call Center at [phone] during business hours to leave a message (or schedule a return appointment).  Our call center staff are trained to gather information that will help your doctor?s office respond to your message quickly and correctly. Please give them as much information as possible to help them transmit your call to the appropriate staff in your doctor?s office.  Calls will usually be returned on the same business day unless the doctor is out of the office, in which case they will be forwarded to the covering doctor within 2 business days.    4. How do I request a medication refill?    If you sign up for [facility] online, you can request refills under the ?Messaging? section titled ?Request Rx Refill.?  You can ask your pharmacy to contact our office directly with a refill request.   You can call our Call Center yourself with a refill request.   Important guidelines for medication requests:   New medications generally require an office visit for medical assessment and medication counseling.  Medication safety monitoring is different for every medication. Please establish a plan with your doctor for the frequency of office visits, laboratory tests, urine screening, EKGs, blood pressure checks, and any other recommended monitoring for your medications. If the monitoring is not up-to-date, you will be asked to come in to the office (or laboratory, if needed) prior to the refill approval.  Most long-term medications require a visit at least annually to check up on your health.  Medications prescribed by a specialist may require refills through the office of the specialist.  DEA scheduled medications cannot be prescribed electronically and require a signed paper prescription from your doctor.  DEA Schedule II medications require a primary care visit for each refill request. Please plan ahead to schedule these visits with your personal primary care doctor. Common examples include: opiate medications such as morphine or oxycodone and stimulants such as amphetamine (Adderall).    Other DEA Schedule medications (Schedule III, IV, and V) also require regular primary care office visits for refills. Common examples include: tramadol (Ultram), carisoprodol (Soma), clonazepam (Klonopin), alprazolam (Xanax), lorazepam (Ativan), zolpidem (Ambien), pregabalin (Lyrica), testosterone.      5. How do I get after-hours care?    QMVHQION: We strive to offer same-day appointments in our office Monday-Friday, 8am-5pm. Call during these hours to request an urgent appointment: [phone]     Evenings/weekends: Our colleagues offer access to after-hours care at our walk-in clinic:    [facility]  [address], [address], [place]   [phone]  Monday - Friday: 5:00pm to 9:00 pm  Saturday - Sunday & Holidays: 9:00 am to 5:00 pm  Laboratory and x-ray services available    [facility] has additional immediate care sites, see locations at: [email or web address]    Paging: If you need to have your doctor (or the on-call doctor) paged through our emergency line, call [phone]. Please note this is only for urgent issues.    For serious and life-threatening concerns you should call 911.    Emergency Services    [facility]   Emergency Department  [address]  [place], [place] [address]  Hospital Information: [phone]  Emergency Department: [phone]    [name]   Emergency Department  [address]  [place], [place] [address]  Hospital Information: [phone]  Emergency Department: [phone]

## 2021-01-07 NOTE — Patient Instructions
Thank you for visiting my clinic today.    During the visit, these diagnoses were addressed:  1. Dental infection  - XR mandible 4 views or more complete; Future  - XR facial bones 3 views or more complete; Future  - XR maxilla; Future  - CBC & Auto Differential; Future  - Comprehensive Metabolic Panel; Future  - Sedimentation Rate, Erythrocyte; Future  - C-Reactive Protein; Future    2. Tooth pain  - XR mandible 4 views or more complete; Future  - XR facial bones 3 views or more complete; Future  - XR maxilla; Future  - CBC & Auto Differential; Future  - Comprehensive Metabolic Panel; Future  - Sedimentation Rate, Erythrocyte; Future  - C-Reactive Protein; Future    Please obtain requested laboratory testing at this location:  None Specified    Please obtain any newly prescribed medicines at this pharmacy:    [facility] [phone] - [place], [place] - [address]  [address]  [place] [address]      New Orders Placed This Visit:  Orders Placed This Encounter    XR mandible 4 views or more complete    XR facial bones 3 views or more complete    XR maxilla    CBC & Auto Differential    Comprehensive Metabolic Panel    Sedimentation Rate, Erythrocyte    C-Reactive Protein    amoxicillin-clavulanate 875-125 mg tablet       Return to clinic:    Return if symptoms worsen or fail to improve.    Your previously scheduled follow up appointments are listed here:  Future Appointments   Date Time Provider Department Center   01/12/2021  9:40 AM Cho, Denyse Dago., MD CARD [id number] MEDICINE   01/17/2021 10:30 AM Vivas, Doy Hutching., MD [facility]   02/01/2021  9:00 AM Mayo, Pincus Sanes., MD [facility]       How to get into contact with me:  Please obtain my business card from my front staff office.  Please call my office with any questions or concerns:  [phone].  For non-urgent questions, you may also contact me electronically via [email or web address].  [email or web address]    Richelle Ito, MD, PhD  ATTENDING PHYSICIAN  [place] [facility]  [facility]

## 2021-01-08 NOTE — Progress Notes
Attending Attestation Note  I reviewed the history and physical findings.  I discussed the management with the resident and patient.  I agree with the plan of care in the resident note.  Patient reported feeling anxious and panic symptoms for upcoming neck surgery.  Lorazepam helped for recent procedure.  Feeling anxious and requested lorazepam to help leave and prescription for 2 pills.  Symptoms improved with lorazepam .'5mg'$  in clinic.  Encouraged adherence with blood pressure medication  1. Hypertension, unspecified type    2. Anxiety    3. Postmenopausal bleeding    4. Neck pain         Return in about 2 weeks (around 01/21/2021), or if symptoms worsen or fail to improve.     Sha Amer E. Carlena Bjornstad, [facility]

## 2021-01-08 NOTE — Addendum Note
Addended by: Ned Clines E on: 01/07/2021 09:01 PM     Modules accepted: Level of Service

## 2021-01-10 NOTE — Progress Notes
CARDIOLOGY OUTPATIENT CONSULTATION NOTE    PRIMARY CARE PROVIDER: Clint Lipps, DO    REFERRED BY: No referring provider defined for this encounter.    Chelsea [name]  [id number]  60 y.o.  01-Nov-1960      REASON FOR CONSULTATION/REFERRAL: Abnormal Cardiac Stress Test and Cardiovascular Assessment     HISTORY OF PRESENT ILLNESS: Chelsea [name] is a 60 y.o. [name] with history of DM, HTN, Hypothyroidism, Cervical spinal myelopathy, OSA, who presents to cardiology for Abnormal Cardiac Stress Test and Cardiovascular Assessment       Last seen by Dr. Ricki [name] in 1/22, who was thoughtful towards the [name]'s care to place in a referral and per notes  ''Had abnormal stress as part of preop exam with cardiology showing no ischemic EKG changes but e/o large anterior wall ischemia. Recommended for angiogram and starting ASA 81 mg every day. She was seen by a second cardiologist who repeated an exercise nuclear stress test with no evidence of ischemia and recommended stopping aspirin and no need for angiogram. She would like a second opinion at [facility]. Denies CP, SOB, orthopnea. Has mild BL LE edema.''    Today, pt states that she came out to [place] in 1983, but originally from [place], but here with kids 3, 7 grandchildren, 1 ggc all between [place] and [place]. 3 younger kids and great grandbaby 4 here in [place]. [place] in 09/2020 plan vacation and meet there with everyone. Son and DIL want ot go [place]. States the issues with back and forth stress tests results have been frustating and provided eduation regarding all tests and possible interpretiaons.     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 Pt otherwise doing well and went over results of NM stress test. 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 Still with spinal pain. Improved pain since injection/procedure this past Thursday. Denies CP.DOE. Pt prior nurse as well. Questions about BMI and BP as well.     - 6/22: Pt sent message via mychart previously and per chart '' Are my cholesterol numbers causing a medical concern regarding the functioning of  my heart?  Is my heart functioning okay?  I have been going to the [employer] to walk the treadmill. I know I?m completely over weight and out of shape. I?m concerned about when the treadmill - Target Heart Rate monitor reads 181 and I?m not walking fast and the machine suggests I slow down.  What should I do when my Target Heart Rate reach this 181 level?  Is this a concern? Also, may I please ask, what should my Target Heart Rate range should be while using the treadmill?  May I please ask you what is the 60 to 65% left ventricular not seen well mean?'' Today, pt states that some neck discomfort and pending surgery 01/05/21 and wearing neck brace and recent steroid injection in neck/back and helped but pain back and repeat injection 2 weeks ago. Pending eval with spine MD Dr. Konrad [name] tomorrow. Trying to go to gym, but limited due to neck pain. Went over all questions regarding cholesterol, and fitness/exercise regimen, heart rate, and prior cardiac tests.     7/22: Today, pt states that under court proceedings due to head injury and under a lot of stress and BP has been up and feels like hyperventilating at time and worsened with neck pain (wearing brace today as well) and pending neck surgery 8/24. Also told she has to pull 4-6 teeth  which is also a big source of stress. Also lots of paperwork which is stressful. IN person visit today. SBP this AM was 135/80 and with activity to 138/81. SBP 150s last night though w/ stress as was 120/60s before that. Some gas pains from eating beans a lot and passing gas lots. Denies CP. Went over ECG and prior cardiac tests and pt with high anxiety entire visit. Seen in resident PCP clinic 7/22 and per notes ''[name] here for hypertension. Was with infectious disease appointment found to have blood pressure 210/190, on repeat it was 150 systolic. [name] asymptomatic throughout this time but reports high levels of stress and caffeine intake today. Previously her blood pressures have been under control with systolics in 120s, occasionally in 130s. Compliant with her CPAP machine.  On vitals here blood pressure 123/66 after sitting down after 5 minutes.  [name] extremely anxious during visit, requesting lorazepam. Per her report, had to be wheeled in for the visit because of her panic/anxiety. [name] with isolated elevated measurement in ID clinic. However on repeat measurements in calm environment in clinic [name] has been normotensive 120s systolics. [name] reporting extreme levels of anxiety + panic attack earlier this morning. ''      PAST MEDICAL HISTORY:  Past Medical History:   Diagnosis Date   ? Abnormal cardiovascular stress test 06/21/2020    06/21/2020 Preop exam showed abnormal stress test with no EKG e/o ischemia but e/o large anterior wall ischemia with recommendation for cardiac catheterization. Had second opinion with repeat exercise nuclear stress test showing no e/o ischemia and was recommended against cardiac catheterization. I recommended starting Atorvastatin 40 mg every day for her DM2, but [name] wishes to repeat labs fir   ? Diabetes mellitus (HCC/RAF) 02/20/2017    06/21/2020 Chronic, stable. Last HgA1c 6.6% on 06/03/19. C/w MFM 500 mg BID. Repeat HgA1c. Recommended starting Atorvastatin 40 mg every day but [name] wishes to repeat labs first. Will need to clarify last retinopathy screening at next visit.    ? Hypertension, essential 06/21/2020    06/21/2020 Chronic,s table. C/w Benicar 40/25 mg x1/2 tablet every day. Reassess at next in office appointment.    ? Hypothyroidism 06/21/2020   ? Myelopathy concurrent with and due to spinal stenosis of cervical region (HCC/RAF) 06/21/2020    06/21/2020 Radiculopathy and myelopathy symptoms since forehead injury 08/25/19. MRI cervical spine 09/30/19 showing multilevel degenerative changes and central disk/osteophyte protrusion impinging the spinal cord with severe spinal stenosis and severe BL foraminal stenosis. Originally recommended for surgical intervention, which was then cancelled due to abnormal preoperative stress test. Neurosurge   ? OSA (obstructive sleep apnea) 06/21/2020    06/21/2020 Chronic, stable. C/w CPAP at bedtime.        PAST SURGICAL HISTORY:  Past Surgical History:   Procedure Laterality Date   ? CESAREAN SECTION      x2   ? CHOLECYSTECTOMY  1987   ? INCISIONAL HERNIA REPAIR  1987       ALLERGIES:  Allergies   Allergen Reactions   ? Iodinated Diagnostic Agents Anaphylaxis       CURRENT MEDICATIONS:  No current facility-administered medications for this visit.       SOCIAL HISTORY:  Social History     Socioeconomic History   ? Marital status: Single   Tobacco Use   ? Smoking status: Former Smoker     Packs/day: 0.25     Years: 5.00     Pack years: 1.25     Types:  Cigarettes   ? Smokeless tobacco: Never Used   Vaping Use   ? Vaping Use: Never used   Substance and Sexual Activity   ? Alcohol use: Not Currently   ? Drug use: Not Currently         FAMILY HISTORY:  No family history on file.    REVIEW OF SYSTEMS  Comprehensive 14-point review of systems was conducted and negative except for those points indicated in the HPI.     PHYSICAL EXAMINATION:  Last Recorded Vital Signs:    01/12/21 0954   BP: 131/71   Pulse: 67   Resp: 18   Temp: 36.5 ?C (97.7 ?F)   SpO2: 99%     General: NAD, AAOx4  HEENT: Anicteric, oropharynx clear, moist mucous membranes  Neck: JVP 8. No lymphadenopathy, supple, no obvious lymphadenopathy  Heart: regular rate and rhythm. No rubs or murmurs  Lungs: course breath sounds bilaterally, no intercostal retractions  Abd: +BS, NTND, no obvious splenomegaly on exam.   Extremities: no clubbing, cyanosis, or edema  Skin: warm, dry , and well perfused, normal tugor.   Neuro: grossly non focal  Psych: Pleasant, conversant, appropriate insight and orientation         DIAGNOSTIC STUDIES:  Lab Results Component Value Date    NA 140 01/07/2021    K 3.7 01/07/2021    CL 99 01/07/2021    CO2 31 (H) 01/07/2021       Lab Results   Component Value Date    WBC 7.52 01/07/2021    HGB 14.7 01/07/2021    HCT 46.1 (H) 01/07/2021    MCV 91.5 01/07/2021    PLT 288 01/07/2021       Last 3 Lipids (Up to last 3 results from the past 64332 hours)      01/11 0819    Cholesterol       185  Comment: The significance of total cholesterol depends on the values of LDL, HDL, triglycerides and the clinical context. A [name]-provider discussion may be considered.         Cholesterol, HDL       44  Comment: If HDL cholesterol level falls outside of the designatedrange, a [name]-provider discussion is recommended         Triglycerides       107  Comment: If Triglyceride level falls outside of the designated range,a [name]-provider discussion is recommended.         Non-HDL,Chol,Calc       141  Comment: If Non-HDL cholesterol level falls outside of the designatedrange, a [name]-provider discussion is recommended.               Lab Results   Component Value Date    ALT 18 01/07/2021    AST 18 01/07/2021    ALKPHOS 88 01/07/2021    BILITOT 0.3 01/07/2021       Lab Results   Component Value Date    HGBA1C 6.2 (H) 01/07/2021       Future Appointments   Date Time Provider Department Center   01/12/2021  9:40 AM Devaun Hernandez, Denyse Dago., MD CARD [id number] MEDICINE   01/17/2021 10:30 AM Bobbe Medico., MD [facility] NEUROSURGERY   02/01/2021  9:00 AM Mayo, Pincus Sanes., MD NEUSM[facility]OV SM NEUROLOGY     10/1997 TTE  1. Technically difficult echocardiogram; left ventricular endocardium  not well seen. Normal left ventricular size, wall thickness, wall  motion and systolic function; estimated left ventricular ejection  fraction is 60-65 %.  2. Normal right ventricular size and systolic function.  3. Normal atrial sizes. Normal inferior vena cava size with normal  respiratory changes is consistent with normal right atrial  pressure.  4. Aortic valve morphology not well seen. Peak aortic valve velocity  is approximately 1.7 m/sec. No aortic regurgitation.  5. Mild focal thickened mitral valve. Mitral valve E-velocity is 99  cm/sec; peak mitral valve A-velocity is 78 cm/sec; mitral valve  isovolumic relaxation time is 75 msec. Mitral valve deceleration  time of 202 msec is within normal limits. No mitral regurgitation.  6. Minimal tricuspid regurgitation. Tricuspid regurgitant velocity is  2.3 m/sec. Estimated right ventricular systolic pressure is 31 mm  Hg (if right atrial pressure is 10 mm Hg) which is consistent with  normal pulmonary artery pressure.  7. No pulmonic valve regurgitation. Time to peak velocity of right  ventricular outflow tract is approximately 167 msec which is  consistent with normal pulmonary artery pressure.  CONCLUSIONS  1. Technically difficult echocardiogram; grossly normal left  ventricular systolic function; left ventricular endocardium not  well seen.  2. Mildly thickened mitral valve.  3. Minimal tricuspid regurgitation.    OSH 10/21 NM stress  Normal exercise stress myocardial perfusion imaging. ?   The [name] had no symptoms or EKG changes during exercise achieving 102%   of MPHR.   Gated SPECT imaging revealed normal myocardial perfusion and function with   no evidence of inducible ischemia    OSH 8/21 TTE  ? ?Mild concentric left ventricular hypertrophy with normal systolic   function   ? ?Normal diastolic function   ? ?Mild left atrial enlargement   ? ?Trace tricuspid regurgitation and normal pulmonary pressures    1/22 ECG  - sinus brady HR 58, QTC 410, no significant arrhythmias or prior MI or active ischemic changes noted    2/22 NM stress  CONCLUSIONS   1. Normal exercise stress ECG study at an adequate cardiac workload.   2. Good exercise tolerance.   3. Baseline blood pressure category is elevated (stage I hypertension). Blood pressure response to stress was exaggerated.   4. [name]'s symptoms were not suggestive of ischemia.   5. ECG findings are not suggestive of ischemia.   6. No exercise induced arrhythmias were noted.  ?  1) Normal exercise stress myocardial perfusion imaging SPECT study at an adequate cardiac workload. There is no scintigraphic evidence of ischemia or prior myocardial infarction.  ?  2) Normal left ventricular size and systolic function. LVEF  65%.  ?  3) No previous cardiac nuclear stress test was available for comparison.    2018 ACC/AHA guidelines recommends moderate or high-intensity statin because of diabetes. [name] is receiving guideline-directed statin therapy; monitor adherence.  10-Year ASCVD risk is 13.4% Continue moderate or high-intensity statin therapy. Monitor adherence as of 2:04 PM on 01/10/2021.  10-Year ASCVD risk with optimal risk factors is 2.7%.  Values used to calculate ASCVD score:  Age: 60 y.o.   Gender: Female Race: Black  HDL cholesterol: 39 mg/dL. HDL cholesterol measured on 09/14/2020.  Total cholesterol: 166 mg/dL. Total cholesterol measured on 09/14/2020.  LDL cholesterol: 109 mg/dL. LDL cholesterol measured on 09/14/2020.  Systolic BP: 122 mm Hg. BP was measured on 01/07/2021.  The [name] is being treated with a medication that influences SBP.  The [name] is currently not a smoker.  The [name] has a diagnosis of diabetes.  Click here for the [facility] ASCVD Cardiovascular Risk Estimator Plus tool (  online calculator).    ECG 7/22 clinic visit   - NSR, HR 65, QTC 409, nonspecific artifact, no ischemic changes seen.     ASSESSMENT/PLAN:   Miyana Mordecai is a 60 y.o. [name] with history of DM, HTN, Hypothyroidism, Cervical spinal myelopathy, OSA, who presents to cardiology for Abnormal Cardiac Stress Test and Cardiovascular Assessment     Abnormal Cardiac Stress Test and Cardiovascular Assessment   - ''Had abnormal stress as part of preop exam with cardiology showing no ischemic EKG changes but e/o large anterior wall ischemia. Recommended for angiogram and starting ASA 81 mg every day. She was seen by a second cardiologist who repeated an exercise nuclear stress test with no evidence of ischemia and recommended stopping aspirin and no need for angiogram''  - Prior 9/21 NM stress report noted large anterior infarct area but then NM stress done again (results above in 10/21) which were unremarkable. Only able to view reports, but given habitus, possible that anterior breast attenuation, if not corrected for correctly on an NM study, can result in an anterior infarct pattern, especially if images were not corrected for gender/habitus. However, no images available for review, but requested pt to provided images on CD if possible.   - However, given concerns and distraught of pt regarding these discrepant results, discussed options regarding to have a final 3rd confirmatory test and discussed various stress testing options and risks and benefits of each (TTE stress echo, CCTA, NM stress, LHC, etc) and repeated NM stress per pt request to clarify findings, and performed 2/22 as above with normal results and negative for any ischemia or prior MI. And provided education today 6/22 regarding findings in more detail as per HPI for both stress test and prior echo, particularly educated pt in regards to heart rate and exercise target HR, prior TTE findings and cholesterol and mgmt.   - During discussion with [name], provided extensive ED/ER return warnings to go to the hospital for further workup and management immediately if her symptoms worsen or persist, including, but not limited to chest pain, shortness of breath, dyspnea on exertion, syncope, dizziness, PND/orthopnea, swelling, or palpitations, to which pt states understanding.   - ECG 7/22 clinic visit done today showing NSR, HR 65, QTC 409, nonspecific artifact, no ischemic changes seen. And discussed with [name] and provided education regarding all prior cardiac testing.     #HLD  - LDL 109 in 3/22  - working on diet/exercise, and not reliably taking statin and provided education regarding compliance.     #Hypothyroidism  - S/p RAI .   - On Levothyroxine 100 mcg every day  - TSH WNL 1/22.   ?  #DM2  - Last HgA1c 6.7% on 06/29/20  - COnt metformin.    ?  #HTN  - SBP in clinic 120s previously and when non-stressed SBP well controlled  - Prior PCP visit with SBP 210s with stress and BP log on phone pic reviewed and when non-stressed pt SBP 120s and HR 60s.   - Stress reduction advised as BP elevated when stressed but normal when non-agitated/nonstressed   - Cont benicar and stress reduction   - F/U PCP  ?  #Obesity  - Chronic, stable  - Recommend cardiac healthy diet, exercise, and weight recommendations: eat an overall healthy dietary pattern similar to DASH (Dietary Approaches to Stop Hypertension) eating plan and aim for at least 150 minutes of moderate physical activity (aerobic) or 75 minutes of vigorous physical activity (or an equal  combination of both) each week for weight loss as well as to improve physical and cardiovascular fitness, if possible. Also recommend education about intermittent fasting.   - Consider PT for neck pain and adaptation to exercise regimen    #OSA  - Cont CPAP qHS    Anxiety  - F/U PCP and cont meds  - stress reduction as above     Return to clinic PRN and with PCP in the interim (or sooner PRN)    Thank you for your referral Dr. Ricki [name]!      The above plan of care, diagnosis, orders, and follow-up were discussed with the [name]. I personally reviewed labs/imaging/ECG/telemetry/hemodynamics/cardiodiagnostic studies in depth. 42 minutes were spent personally by me today on this encounter which include today's pre-visit review of the chart, obtaining appropriate history, performing an evaluation, documentation and discussion of management with details supported within the note for today's visit as welll as reviewing prior medical data, counseling [name] and/or coordinating care, and in prior note and chart review of both cardiac and non-cardiac notes and studies, and analysis for medical decision making. Risks/Benefits/Alternatives of all cardiac medications explained, including (but not limited to), low blood pressure chest pain, shortness of breath, dyspnea on exertion, syncope, dizziness, PND/orthopnea, swelling, palpitations, rash, kidney/electrolyte abnormalities, etc. Questions related to this recommended plan of care were answered.     Miquel Dunn, MD  [facility] Cardiology

## 2021-01-11 ENCOUNTER — Non-Acute Institutional Stay: Payer: MEDICAID

## 2021-01-11 DIAGNOSIS — Z01818 Encounter for other preprocedural examination: Secondary | ICD-10-CM

## 2021-01-11 MED ORDER — MECLIZINE HCL 25 MG PO TABS
ORAL_TABLET | 0 refills | Status: AC
Start: 2021-01-11 — End: ?

## 2021-01-12 ENCOUNTER — Telehealth: Payer: MEDICAID | Attending: Cardiovascular Disease

## 2021-01-12 DIAGNOSIS — E890 Postprocedural hypothyroidism: Secondary | ICD-10-CM

## 2021-01-12 DIAGNOSIS — Z136 Encounter for screening for cardiovascular disorders: Secondary | ICD-10-CM

## 2021-01-12 DIAGNOSIS — E119 Type 2 diabetes mellitus without complications: Secondary | ICD-10-CM

## 2021-01-13 ENCOUNTER — Ambulatory Visit: Payer: MEDICAID

## 2021-01-14 ENCOUNTER — Ambulatory Visit: Payer: MEDICAID

## 2021-01-14 DIAGNOSIS — Z1159 Encounter for screening for other viral diseases: Secondary | ICD-10-CM

## 2021-01-14 DIAGNOSIS — E119 Type 2 diabetes mellitus without complications: Secondary | ICD-10-CM

## 2021-01-14 DIAGNOSIS — L720 Epidermal cyst: Secondary | ICD-10-CM

## 2021-01-14 MED ORDER — CYCLOBENZAPRINE HCL 10 MG PO TABS
10 mg | ORAL_TABLET | Freq: Three times a day (TID) | ORAL | 1 refills | 10.00000 days | Status: AC | PRN
Start: 2021-01-14 — End: ?

## 2021-01-14 NOTE — Progress Notes
PATIENT: Chelsea [name]  MRN: [id number]  DOB: 11-08-1960  DATE OF SERVICE: 01/14/2021    CHIEF COMPLAINT:   Chief Complaint   Patient presents with   ? Hypertension   ? Headache        HPI   Chelsea [name] is a 60 y.o. female who  has a past medical history of Abnormal cardiovascular stress test (06/21/2020), Diabetes mellitus (HCC/RAF) (02/20/2017), Hypertension, essential (06/21/2020), Hypothyroidism (06/21/2020), Myelopathy concurrent with and due to spinal stenosis of cervical region (HCC/RAF) (06/21/2020), and OSA (obstructive sleep apnea) (06/21/2020). who presents for   Chief Complaint   Patient presents with   ? Hypertension   ? Headache     #HTN  On Benicar 40/25 mg x1/2 tablet every day    Cervical myeloradiculopathy  Onset since forehead injury 08/25/19, with evidence of cervical spinal stenosis on imaging. ?Her symptoms included severe neck pain, imbalance, headaches, and tingling in the hands. ?Addressed thoroughly with Dr. Vickki Hearing. ?  S/p C7-T1 interlaminar ESI on 07/28/20 with initial relief but tapered over course of a month. ?Imaging demonstrates multilevel degeneration with cervical stenosis most severe at C5/6.??  Planning on surgical intervention with neurosurg Dr. Konrad Felix  11/22/2020 Scheduled for surgical intervention on 01/05/21 with Dr. Konrad Felix  01/21/2021 Presents in a brace. Needs to go for disposition. Would like muscle relaxants.   ?  #Hypercholesterolemia with elevated ASCVD risk  Patient presents with acute intermittent disorientation with severe headaches since presumably 09/06/20. ?She reports forgetting her birthday month. ?She does have chronic history of neck pain, headaches, imbalance, and peripheral neuropathy due to her cervical spine disease, further below. ?  Also +history of concussions with forehead trauma. ?She feels her forehead still looks very different to her.  She was seen at urgent care on the same day, and has since had two [facility] visits on 09/07/20 and earlier today 09/09/20.  MR brain shows possible evolving subacute to chronic infarct within the posterior insular cortex. ?She is already started on atorvastatin. ?She plans to repeat imaging in 1 month as advised   Today, she reports headaches have stopped. ?Dizziness improved with sitting down and rest. ?She feels it is possible from doing too much but she is very overwhelmed. ?She has to sleep upright due to neck pain  09/22/2020?Started on ASA 81 mg every day + Atorvastatin 40 mg every day at last visit.?She has not been taking the Atorvastatin. She has been taking the aspirin. Has an appointment with neuro on 10/25/20.?  11/22/2020 Seen by neuro who recommended continuing ASA and statin therapy and repeating MRI brain w/ and w/o contrast.  She has a h/o allergy to iodinated contrast. Seen by Cards 11/17/20 as well.  01/21/2021 MRI brain 11/23/20 with stable appearance of nonspecific FLAIR hyperintense signal within the posterior right insular cortex  ?  #DM2  Diagnosed with HgA1c 9%.?  Initially treated with MFM then lost significant weight  11/22/2020 HgA1c 6.6% on 08/2020. Diet controlled  01/21/2021 HgA1c 6.2%. Diet controlled. Retinopathy negative 2 weeks ago.     ?  Not addressed this visit:?  #Hypothyroidism  S/p RAI in 30's.?On LT4.Marland Kitchen  11/22/2020 Euthyroid 09/07/20    ?#Fibroids  Planning on exploring UFE with IR  S/p repeat EMB 11/11/20 with pathology negative for malignancy?  ?  #Anxiety  Very anxious esp in s/o her chronic neck pain, headaches, and confusion  History of 20y disability due to severe panic disorder?  ?  #Obesity  She reports interval weight loss of 9 pounds -  today's weight is 243 lbs  In weight loss program  ?  #OSA  Remains on CPAP at bedtime  ?  #Recurrent severe headaches  Following with Neurology who suspected TTH vs migraine     Patient Care Team:  Clint Lipps, DO as PCP - General (Family Medicine)  Clint Lipps, DO as PCP - Plan Assigned (Family Medicine)    MEDS     Outpatient Medications Prior to Visit   Medication Sig   ? amoxicillin-clavulanate 875-125 mg tablet Take 1 tablet by mouth two (2) times daily for 10 days.   ? atorvastatin 10 mg tablet Take 1 tablet (10 mg total) by mouth daily.   ? ciclopirox 8% solution Apply topically at bedtime Apply to adjacent skin and affected nails daily. Remove with alcohol every 7 days. Use up to 48 weeks..   ? clobetasol 0.05% ointment Apply topically two (2) times daily APPLY AND GENTLY MASSAGE INTO AFFECTED AREA(S) TWICE DAILY. Do not apply to face, armpit, or groin..   ? clobetasol 0.05% ointment Apply topically two (2) times daily APPLY AND GENTLY MASSAGE INTO AFFECTED AREA(S) TWICE DAILY. Do not apply to face, armpit, or groin..   ? clobetasol 0.05% ointment Apply topically two (2) times daily APPLY AND GENTLY MASSAGE INTO AFFECTED AREA(S) TWICE DAILY. Do not apply to face, armpit, or groin..   ? diclofenac Sodium 1% gel apply 2 to 3 gram twice a day if needed   ? diphenhydrAMINE 50 mg capsule Take 50 mg benadryl by mouth 1 hour prior to contrast administration.   ? gabapentin (NEURONTIN) 100 mg capsule Take 1 capsule (100 mg total) by mouth three (3) times daily.   ? gabapentin 100 mg capsule .   ? gabapentin 300 mg capsule take 1 capsule by mouth five times a day   ? hydrocortisone 1% cream Apply topically.   ? levothyroxine 100 mcg tablet take 1 tablet by mouth every morning before breakfast.   ? LORazepam 0.5 mg tablet Take 1 tablet (0.5 mg total) by mouth daily as needed for Anxiety. Max Daily Amount: 0.5 mg   ? MECLIZINE 25 mg tablet take 1 tablet by mouth every 6 hours if needed for nausea   ? medroxyPROGESTERone 10 mg tablet Take 1 tablet (10 mg total) by mouth daily.   ? methylPREDNISolone 32 mg tablet Take Methylprednisolone 32 mg x1 tablet 12 hours prior to contrast administration then take Methylprednisolone 32 mg x1 tablet 2 hours prior to contrast administration..   ? olmesartan-hydroCHLOROthiazide 40-25 mg tablet Take 0.5 tablets by mouth daily.   ? RA ASPIRIN EC 81 MG EC tablet take 1 tablet by mouth once daily     No facility-administered medications prior to visit.       PHYSICAL EXAM      Last Recorded Vital Signs:    01/14/21 1517   BP: 139/82   Pulse: 97   Temp: 36.2 ?C (97.2 ?F)     Body mass index is 46.71 kg/m?Marland Kitchen  Wt Readings from Last 3 Encounters:   01/14/21 (!) 255 lb 6.4 oz (115.8 kg)   01/12/21 (!) 257 lb (116.6 kg)   01/07/21 (!) 256 lb (116.1 kg)      Gen - Awake. No acute distress. Age appropriate appearance.   Eyes - EOMI. No conjunctival pallor or injection. No scleral icterus.  Pulm - Nml effort. Clear to auscultation bilaterally. No wheezes, rales, or rhonchi.  Cards - RRR. S1 S2. No murmurs, rubs, or gallops.  MSK -  In neck brace  Neuro - AAOx3. CN2-12 grossly intact.  Psych - Normal mood and affect.       LABS/STUDIES   I have:   []  Reviewed/ordered []  1 []  2 []  ? 3 unique laboratory, radiology, and/or diagnostic tests noted below    []  Reviewed []  1 []  2 []  ? 3 prior external notes and incorporated into patient assessment    []  Discussed management or test interpretation with external provider(s) as noted       Lab Studies:  CBC:   Results for orders placed or performed in visit on 01/07/21   CBC   Result Value Ref Range    White Blood Cell Count 7.52 4.16 - 9.95 x10E3/uL    Red Blood Cell Count 5.04 3.96 - 5.09 x10E6/uL    Hemoglobin 14.7 11.6 - 15.2 g/dL    Hematocrit 78.4 (H) 34.9 - 45.2 %    Mean Corpuscular Volume 91.5 79.3 - 98.6 fL    Mean Corpuscular Hemoglobin 29.2 26.4 - 33.4 pg    MCH Concentration 31.9 31.5 - 35.5 g/dL    Red Cell Distribution Width-SD 42.5 36.9 - 48.3 fL    Red Cell Distribution Width-CV 12.7 11.1 - 15.5 %    Platelet Count, Auto 288 143 - 398 x10E3/uL    Mean Platelet Volume 10.1 9.3 - 13.0 fL    Nucleated RBC%, automated 0.0 No Ref. Range %    Absolute Nucleated RBC Count 0.00 0.00 - 0.00 x10E3/uL    Neutrophil Abs (Prelim) 5.09 See Absolute Neut Ct. x10E3/uL   Differential, Automated   Result Value Ref Range    Neutrophil Percent, Auto 67.7 No Ref. Range %    Lymphocyte Percent, Auto 23.5 No Ref. Range %    Monocyte Percent, Auto 6.1 No Ref. Range %    Eosinophil Percent, Auto 1.7 No Ref. Range %    Basophil Percent, Auto 0.7 No Ref. Range %    Immature Granulocytes% 0.3 No Reference Range %    Absolute Neut Count 5.09 1.80 - 6.90 x10E3/uL    Absolute Lymphocyte Count 1.77 1.30 - 3.40 x10E3/uL    Absolute Mono Count 0.46 0.20 - 0.80 x10E3/uL    Absolute Eos Count 0.13 0.00 - 0.50 x10E3/uL    Absolute Baso Count 0.05 0.00 - 0.10 x10E3/uL    Absolute Immature Gran Count 0.02 0.00 - 0.04 x10E3/uL   CBC & Auto Differential    Narrative    The following orders were created for panel order CBC & Auto Differential.  Procedure                               Abnormality         Status                     ---------                               -----------         ------                     ONG[[id number]]                          Abnormal            Final  result               Differential, Automated[[id number]]                          Final result                 Please view results for these tests on the individual orders.       CMP:   Results for orders placed or performed in visit on 01/07/21   Comprehensive Metabolic Panel   Result Value Ref Range    Sodium 140 135 - 146 mmol/L    Potassium 3.7 3.6 - 5.3 mmol/L    Chloride 99 96 - 106 mmol/L    Total CO2 31 (H) 20 - 30 mmol/L    Anion Gap 10 8 - 19 mmol/L    Glucose 153 (H) 65 - 99 mg/dL    Creatinine 1.61 0.96 - 1.30 mg/dL    Estimated GFR >04 See GFR Additional Information mL/min/1.17m2    GFR Additional Information See Comment     Urea Nitrogen 11 7 - 22 mg/dL    Calcium 54.0 (H) 8.6 - 10.4 mg/dL    Total Protein 7.6 6.1 - 8.2 g/dL    Albumin 4.3 3.9 - 5.0 g/dL    Bilirubin,Total 0.3 0.1 - 1.2 mg/dL    Alkaline Phosphatase 88 37 - 113 U/L    Aspartate Aminotransferase 18 13 - 62 U/L    Alanine Aminotransferase 18 8 - 70 U/L     Hgb A1C:   Lab Results   Component Value Date/Time    HGBA1C 6.2 (H) 01/07/2021 12:54 PM     Lipids:   Results for orders placed or performed in visit on 09/14/20   Lipid Panel   Result Value Ref Range    Cholesterol 166 See Comment mg/dL    Cholesterol,LDL,Calc 109 (H) <100 mg/dL    Cholesterol, HDL 39 (L) >50 mg/dL    Triglycerides 91 <981 mg/dL    Non-HDL,Chol,Calc 191 <130 mg/dL      TSH:   Lab Results   Component Value Date/Time    TSH 1.6 09/07/2020 01:13 PM        A&P   Chelsea [name] is a 59 y.o. female presenting for   Chief Complaint   Patient presents with   ? Hypertension   ? Headache         PROBLEM & ORDERS    ICD-10-CM    1. Hypertension, essential  I10    2. Myelopathy concurrent with and due to spinal stenosis of cervical region (HCC/RAF)  M48.02     G99.2    3. Type 2 diabetes mellitus without complication, without long-term current use of insulin (HCC/RAF)  E11.9    4. Hypercalcemia  E83.52 PTH,Intact & Calcium   5. Candidate for statin therapy due to risk of future cardiovascular event  Z91.89 Lipid Panel   6. Need for hepatitis B screening test  Z11.59 HBS Antigen   7. Epidermoid cyst  L72.0 Referral to Dermatology       ASSESSMENT  #Hypercalcemia  Ca 10.6 with normal albumin  -Check PTH and calcium to start    #Epidermoid Cyst  -Dermatology referral for consideration of excision    Problem List Items Addressed This Visit        HCC Codes    ? Diabetes mellitus (HCC/RAF)     Overview      06/21/2020  Chronic, stable. Last HgA1c 6.6% on 06/03/19. C/w MFM 500 mg BID. Repeat HgA1c. Recommended starting Atorvastatin 40 mg every day but patient wishes to repeat labs first. Will need to clarify last retinopathy screening at next visit.   07/06/2020 Chronic, stable. HgA1c 6.7% on 06/29/20 with negative UACR. Recommended resuming MFM XR 500 mg every day, declined at this time. Start Atorvastatin 20 mg every day with plans to uptitrate. Recommended pneumococcal vaccine, which was declined at this time. Ophtho referral for retinopathy screening.   07/16/2020 Chronic, stable. Did not resume MFM XR 500 mg every day. Reiterated my recommendation to restart MFM 500 mg every day. Recommended restarted Atorvastatin 20 mg every day.   08/31/2020 Chronic, stable. Patient declines medication. C/w diet and exercise. Has appt for retinopathy screening at [facility]. Recommended PPSV23, which was declined. Recommended moderate to high intesnity statin, which was declined. Therefore will start Atorvastatin 10 mg every day.   11/22/2020 Chronic, stable. HgA1c 6.6% on 09/14/20. C/w diet and exercise. C/w Atorvastatin 10 mg every day (recommended at least 40 mg every day, which was refused).   01/14/2021 Chronic, stable. HgA1c 6.2% 01/07/21. C/w diet and exercise. Recommended daily adherence with Atorvastatin.            ? Myelopathy concurrent with and due to spinal stenosis of cervical region (HCC/RAF)     Overview      06/21/2020 Radiculopathy and myelopathy symptoms since forehead injury 08/25/19. MRI cervical spine 09/30/19 showing multilevel degenerative changes and central disk/osteophyte protrusion impinging the spinal cord with severe spinal stenosis and severe BL foraminal stenosis. Originally recommended for surgical intervention, which was then cancelled due to abnormal preoperative stress test. Neurosurgery referral provided today. She has had a h/o difficult intubation in the past.   07/06/2020 Chronic, stable. Evaluated by neurosurg with plans to repeat MRI in 1 month and consider surgical intervention. Seen by PMR who prescribed medrol dose pack. C/w Gabapentin.   07/16/2020 Chronic, stable. MRI cervical spine with moderate to severe spinal canal stenosis and mild increased T2 signal suggestive of cord edema. Medrol dosepack and Meloxicam 7.5 mg every day + Gabapentin for pain control. Recommended urgent f/u with Neurosurg.   09/10/2020  Chronic, stable. S/p ESI by Dr. Vickki Hearing on 07/28/20 with significant improvement. Following with neurosurgery and pmr on gabapentin.  11/22/2020 Chronic, stable. Planning on surgical intervention with Dr. Konrad Felix on 01/05/21.   01/14/2021 Chronic, stable. C/w neck brace. Flexeril 10 mg TID PRN muscle spasm. F/U with neurosurgery for planned surgical intervention on 01/05/21.               Other    ? Candidate for statin therapy due to risk of future cardiovascular event     Overview      09/10/2020  Chronic, stable. Start Atorvastatin 40 mg every day given DM2 + age + abnormal MRI brain finding c/f possible evolving subacute to chronic infarct. Will start ASA 81 mg every day as well and recommended f/u with neurology to discuss.   09/22/2020 Chronic, stable. Patient is very hesitant to take cholesterol lowering medication despite counseling on indication with DM2 + age > 40 and possible subacute to chronic infarct on MRI brain. Patient agreeable to start Atorvastatin 10 mg every day, c/w ASA 81 mg every day, and f/u with neurology. Repeat MRI brain previously ordered.   11/22/2020 Chronic, stable. Poor compliance with statin therapy. C/w Atorvastatin 10 mg every day (recommended at least 40 mg every day but patient refuses). C/w  ASA 81 mg every day for secondary prevention of possible lacunar stroke on MRI brain.            ? Hypertension, essential     Overview      06/21/2020 Chronic,s table. C/w Benicar 40/25 mg x1/2 tablet every day. Reassess at next in office appointment.   09/22/2020   Chronic, stable. BP acceptable. C/w Benicar 40/25 mg x1/2 tablet every day.   01/14/2021 Chronic, stable. BP acceptable in office. Home BP cuff is 20 mmHg higher systolic. Suspect periods of elevated BP is due to anxiety. C/w Benicar 40/25 mg x1/2 tablet every day.                    Diagnoses and all orders for this visit:    Hypertension, essential    Myelopathy concurrent with and due to spinal stenosis of cervical region (HCC/RAF)    Type 2 diabetes mellitus without complication, without long-term current use of insulin (HCC/RAF)    Hypercalcemia  -     PTH,Intact & Calcium; Future    Candidate for statin therapy due to risk of future cardiovascular event  -     Lipid Panel; Future    Need for hepatitis B screening test  -     HBS Antigen; Future    Epidermoid cyst  -     Referral to Dermatology    Other orders  -     cyclobenzaprine 10 mg tablet; Take 1 tablet (10 mg total) by mouth every eight (8) hours as needed for Muscle spasms.      Orders Placed This Encounter   ? PTH,Intact & Calcium   ? Lipid Panel   ? Referral to Dermatology   ? HBS Antigen   ? cyclobenzaprine 10 mg tablet       The above recommendation were discussed with the patient.  The patient has all questions answered satisfactorily and is in agreement with this recommended plan of care.      Clint Lipps, DO  Clinical Instructor  Department of Medicine  01/21/2021 9:05 PM

## 2021-01-14 NOTE — Patient Instructions
Omron blood pressure cuff    Ozempic - for weight loss

## 2021-01-17 ENCOUNTER — Ambulatory Visit: Payer: MEDICAID

## 2021-01-17 ENCOUNTER — Non-Acute Institutional Stay: Payer: MEDICAID

## 2021-01-17 DIAGNOSIS — R9431 Abnormal electrocardiogram [ECG] [EKG]: Secondary | ICD-10-CM

## 2021-01-17 DIAGNOSIS — R9439 Abnormal result of other cardiovascular function study: Secondary | ICD-10-CM

## 2021-01-17 NOTE — Telephone Encounter
CARDIOLOGY OUTPATIENT ONLINE EVALUATION NOTE & ADDENDUM    ADDENDUM 01/17/2021:   Received the following message regarding the patient Chelsea [name] and provided the reply, assessment and management as below:    Hi Dr. Theodoro Grist,  ?  I am confused about my Abnormal Electrocardiogram Diagnosis which was uploaded today January 17, 2021 if I  reading this correctly. It said abnormal. Would you kindly explain the abnormal diagnosis?  Should there be a medical concern for my Spinal Stenosis surgery which is scheduled for February 09, 2021.   ?  You and your staff is  much appreciated. :)  ?  Thank you,  ?  Chelsea [name]  Your patient :)      Dear Chelsea [name],  ?  Thanks for your message and your ECG overall is fine - no major abnormalities. Small variants in some of the lines causes nonspecific changes, but nothing significnat - and similar to your prior ECGs as well.   ?  Respectfully,  ?  Chelsea [name]       For this additional addendum, the above plan of care, diagnosis, orders, and follow-up were reviewed personally by me, as well as the labs, imaging, ECG, telemetry, hemodynamics, and cardiodiagnostic studies. 6 minutes were spent re-reviewing patient's chart to appropriately address pt's concerns/care, for composing reply to patient, in-depth chart review, as well as coordinating care and analysis for medical decision making as applicable.     Miquel Dunn, MD  [facility]

## 2021-01-20 ENCOUNTER — Telehealth: Payer: MEDICAID

## 2021-01-20 ENCOUNTER — Non-Acute Institutional Stay: Payer: MEDICAID

## 2021-01-20 NOTE — Telephone Encounter
Reply by: Sande Brothers  Spoke with patient.

## 2021-01-20 NOTE — Telephone Encounter
Call Back Request      Reason for call back: Patient is requesting a call back from Crystal in Dr.Vivas office. Please assist. Thank you.    Any Symptoms:  []  Yes  [x]  No      ? If yes, what symptoms are you experiencing:    o Duration of symptoms (how long):    o Have you taken medication for symptoms (OTC or Rx):      Patient or caller has been notified of the 24-48 hour turnaround time.

## 2021-01-28 NOTE — Telephone Encounter
Call Back Request      Reason for call back: Pt asking if she can changer her appt for Monday to a VVV???      Please advise and contact    thank you    Any Symptoms:  '[]'$  Yes  '[x]'$  No       If yes, what symptoms are you experiencing:    o Duration of symptoms (how long):    o Have you taken medication for symptoms (OTC or Rx):      Patient or caller has been notified of the 24-48 hour turnaround time.

## 2021-01-28 NOTE — Telephone Encounter
Pt's appt was changed to a v/v on 8/15 '@8'$ :30.

## 2021-01-31 ENCOUNTER — Telehealth: Payer: MEDICARE

## 2021-01-31 DIAGNOSIS — Z01818 Encounter for other preprocedural examination: Secondary | ICD-10-CM

## 2021-01-31 NOTE — Progress Notes
[facility], [place]  Department of Neurosurgery      Progress Note     Primary Care Physician: Clint Lipps, DO  Referring Practitioner: Bobbe Medico., MD  Attending Physician: Lenna Gilford MD    Chief Complaint: No chief complaint on file.    Date of Service: 01/31/2021    Patient Consent to Telehealth Questionnaire   [facility] TELEHEALTH PRECHECKIN QUESTIONS 01/31/2021   By clicking ''I Agree'', I consent to the below:  I Agree     - I agree  to be treated via a video visit and acknowledge that I may be liable for any relevant copays or coinsurance depending on my insurance plan.  - I understand that this video visit is offered for my convenience and I am able to cancel and reschedule for an in-person appointment if I desire.  - I also acknowledge that sensitive medical information may be discussed during this video visit appointment and that it is my responsibility to locate myself in a location that ensures privacy to my own level of comfort.  - I also acknowledge that I should not be participating in a video visit in a way that could cause danger to myself or to those around me (such as driving or walking).  If my provider is concerned about my safety, I understand that they have the right to terminate the visit.  - I acknowledge that I am physically located in the [place] at the time of this visit       Interim History:   Carma Dwiggins is a 60 y.o.-year-old female, with a history of Abnormal cardiovascular stress test (06/21/2020), Diabetes mellitus (HCC/RAF) (02/20/2017), Hypertension, essential (06/21/2020), Hypothyroidism (06/21/2020), Myelopathy concurrent with and due to spinal stenosis of cervical region (HCC/RAF) (06/21/2020), OSA (obstructive sleep apnea) (06/21/2020),?and 20 years disability due to severe panic disorder,?who?returns?to the clinic with neck pain, imbalance, and tingling in the hands.?The patient was last seen on 09/06/2020, at which time we recommended surgical decompression after clearance with PEPC, via ACDF at C4/5 and C5/ with potential corpectomy if unable to decompress the retrovertebro stenosis. She denied any new neurological symptoms.     Today, she is scheduled to have C5-C6 ACDF with possible C5 Corpectomy on September 20th 2022. She has some infections in the mouth. She has to have her infection cleared for 3 months. She has seen Dr Bretta Bang of Infectious Disease for the control of this. Her pain is persistent in the interim. Her arms, legs, are affected. She has numbness in her legs, and has persistent hip pain. She reports that her steroid injections are currently wearing off. She denies any new neurological symptoms.    Physical Exam:   No Vitals taken at this visit, Telemedicine encounter    All within appearance of video frame:  General: Well appearing, no acute distress  Psych: Alert and oriented to person, place and time  Incision: Healing well with no evidence of infection or dehiscence.  ROM: Symmetric and within normal limits.    Diagnostic Imaging:   No new imaging for review.    Assessment/Plan:   Marigny Borre is a 60 y.o.-year-old female with a history of Abnormal cardiovascular stress test (06/21/2020), Diabetes mellitus (HCC/RAF) (02/20/2017), Hypertension, essential (06/21/2020), Hypothyroidism (06/21/2020), Myelopathy concurrent with and due to spinal stenosis of cervical region (HCC/RAF) (06/21/2020), OSA (obstructive sleep apnea) (06/21/2020),?and 20 years disability due to severe panic disorder,?who?returns?to the clinic with neck pain, imbalance, and tingling in the hands. Her symptoms have  persisted in the interim. There is no new imaging to review.    Plan:   We will consult with Dr Bretta Bang of [facility] to confirm whether the patient should wait 3 months or sooner for the surgery. The patient may continue to see Dr Vickki Hearing for the consideration of steroid injections in the interim for the control of her pain. The appropriate scripts and referrals were given and all precautions and restrictions explained to the patient. The patient was also advised that she can drive as long as pain is controlled and she is not taking any narcotic pain medication. The patient was encouraged to call us with any questions or problems in the interim.    Thank you for allowing [place] to participate in the care of Allex Madia.    40+ minutes were spent personally by me today on this encounter. This time included assisting/instructing patient on how to get on video call as well as pre-visit review of the chart, time spent during the visit, and  today?s time spent after the visit documenting and coordinating care. All questions were answered and Leanda Padmore understood and was satisfied with this plan.     Carolin Sicks assisted in the evaluation of this patient, which was directly supervised by Dr. Lenna Gilford.    SCRIBE SIGNATURE(S):  I, Carolin Sicks, have assisted Shantika Bermea, Doy Hutching., MD with the documentation for Jhordyn Hoopingarner on   01/31/2021 at 8:27 AM.    PHYSICIAN SIGNATURE(S):  Bobbe Medico., MD  01/31/2021 8:27 AM    I have reviewed this note, written by Carolin Sicks and attest that it is an accurate representation of my H & P and other events of the outpatient visit except if otherwise noted.     ________________________________________________________________________________________________________________________________________________________________________________________________________    I had the pleasure of speaking to [place] today. Unfortunately, she has a dental infection that will require a procedure by a dentist. As such, we have put her elective ACDF on hold until she take care of that. I will reach out to her Infectious Disease doctor to get a definitive time from for when she will be ok for surgery. I have told her 3 months, but she is anxious to get this done sooner. She actually does not have symptoms from the infection. I have also advised her that she can get another ESI beforehand if needs something to hold her off until then.    ________________________  Doy Hutching. Konrad Felix, MD  Assistant Professor  Department of Neurosurgery

## 2021-02-01 ENCOUNTER — Telehealth: Payer: MEDICAID | Attending: Neurology

## 2021-02-01 ENCOUNTER — Telehealth: Payer: MEDICAID

## 2021-02-01 NOTE — Telephone Encounter
PDL Call to Practice    Reason for Call:  Patient calling to change her in person appt to [facility]    Appointment Related?  '[x]'$  Yes  '[]'$  No     If yes;  Date:02/01/21  Time:    Call warm transferred to PDL: '[x]'$  Yes  '[]'$  No    Call Received by Practice Representative:  Joneen Boers

## 2021-02-01 NOTE — Progress Notes
NEUROLOGY OUTPATIENT CONSULTATION    VIDEO VISIT    02/01/2021      REFERRING PROVIDER PANG, Manuella Ghazi PROVIDER: Gildardo Cranker [facility])    REASON FOR CONSULT:  Headache  Abnormal MRI Brain    Patient Consent to Telehealth Questionnaire   [facility] TELEHEALTH PRECHECKIN QUESTIONS 02/01/2021   By clicking ''I Agree'', I consent to the below:  I Agree     - I agree  to be treated via a video visit and acknowledge that I may be liable for any relevant copays or coinsurance depending on my insurance plan.  - I understand that this video visit is offered for my convenience and I am able to cancel and reschedule for an in-person appointment if I desire.  - I also acknowledge that sensitive medical information may be discussed during this video visit appointment and that it is my responsibility to locate myself in a location that ensures privacy to my own level of comfort.  - I also acknowledge that I should not be participating in a video visit in a way that could cause danger to myself or to those around me (such as driving or walking).  If my provider is concerned about my safety, I understand that they have the right to terminate the visit.           HISTORY OF PRESENT ILLNESS:  60 y.o. RH lady with myelopathy (Cervical), myoclonus, possible stroke, headache here for evaluation thereof     Has neck pain due to myelopathy related to spinal stenosis. Was having myoclonus of neck and arms, onset 2013 when in [place] in the setting of some shoulder pain, thought initially to be seizure, also had flu like symptoms, then movements resolved, then came back again in 2015 had another episode. Daughter felt it was due to stress. Both arms would move but no LOC and it was uncomfortable, and would have episodes on and off x 2-3 days like ''earthquakes'' and ''aftershocks''. Would occur rarely then resolved during 2019-2022 during pandemic, now have restarted. Would have them 2-3x year prior. She thinks maybe they restarted due to stress of upcoming neck surgery, having them briefly near daily now x 2 weeks.    Had a concussion she reports entering an access [name] 08/2019 which was the start of her neck issues she reports. Had EDI w temporary improvement. Denies neck issue or injury prior to that. Was using a cane due to falls x 2 years prior with dizziness and was on meclizine. Now has neck brace which has helped due to worsening pain with quick movements of neck. Feels myoclonic type movements worsened after that.    She also reports onset of headaches 08/2019 after concussion described as pulsating pain in back of head and front and sides, resolved for some time after 04/2020 but then again returned in 08/2020. She had had an EDI 07/2020 for neck after which time she felt she could move around better.  They again returned 08/19/20 after bending over to play w dog, got up and felt very dizzy and then headaches returned. She also had numbness in face on L and had MRI Brain which revealed possible evolving stroke. She is on asa and lipitor since then but not taking the lipitor. LDL 109.  She also reports OSA and uses CPAP. Does have a hx of headache in youth (age 53), used fiorinal. Current headaches have no vision changes, photophobia, phonophobia, or nausea. No fam hx of headache. Headaches  are better since April and May but she is taking tylenol every day for neck pain still.    Has dizziness when walking she reports, more like on a boat. Feels she leans when walking. No room spinning.    Reporting increased anxiety associated w myoclonus like movements.    Has 7 grandchildren and a great grandson.    MRI Brain repeat showed no great change from prior.  Was previously on zoloft and ativan for anxiety. Never on antipsychotics she reports.  No lightheadedness or dizziness since using meclizine.     INTERVAL HISTORY  Has spinal surgery pending, postponed due to tooth infection.  Has to do a dental surgery.  Headaches resolved, returned a bit lately w stress from deposition paperwork which caused anxiety and spike in BP she reports. Not on SSRI.  Movements have been much less ''minimal'' she reports. She feels they have not occurred maybe since 2019 she states which she got on video then, but hasn't had some since.  No falls. Using walker. Tried PT but states she was too exhausted to continue and wants to focus on surgery which is upcoming but she will go back after surgery.       REVIEW OF SYSTEMS: A 14 point review of systems was otherwise negative per patient inclusive of the following systems: allergy, dermatologic, endocrinologic, HEENT, ophthalmologic, neurologic, psychiatric, gastrointestinal, pulmonary, cardiac, urologic, musculoskeletal, hematologic, and constitutional.       Patient Active Problem List   Diagnosis   ? Diabetes mellitus (HCC/RAF)   ? Abnormal cardiovascular stress test   ? OSA (obstructive sleep apnea)   ? Myelopathy concurrent with and due to spinal stenosis of cervical region (HCC/RAF)   ? Hypertension, essential   ? Hypothyroidism   ? Morbid (severe) obesity due to excess calories (HCC/RAF)   ? Candidate for statin therapy due to risk of future cardiovascular event   ? Abnormal finding on MRI of brain   ? Fibroid   ? Balance disorder     Allergies   Allergen Reactions   ? Iodinated Diagnostic Agents Anaphylaxis     Outpatient Medications Prior to Visit   Medication Sig   ? ciclopirox 8% solution Apply topically at bedtime Apply to adjacent skin and affected nails daily. Remove with alcohol every 7 days. Use up to 48 weeks..   ? clobetasol 0.05% ointment Apply topically two (2) times daily APPLY AND GENTLY MASSAGE INTO AFFECTED AREA(S) TWICE DAILY. Do not apply to face, armpit, or groin..   ? clobetasol 0.05% ointment Apply topically two (2) times daily APPLY AND GENTLY MASSAGE INTO AFFECTED AREA(S) TWICE DAILY. Do not apply to face, armpit, or groin..   ? clobetasol 0.05% ointment Apply topically two (2) times daily APPLY AND GENTLY MASSAGE INTO AFFECTED AREA(S) TWICE DAILY. Do not apply to face, armpit, or groin..   ? cyclobenzaprine 10 mg tablet Take 1 tablet (10 mg total) by mouth every eight (8) hours as needed for Muscle spasms.   ? diclofenac Sodium 1% gel apply 2 to 3 gram twice a day if needed   ? diphenhydrAMINE 50 mg capsule Take 50 mg benadryl by mouth 1 hour prior to contrast administration.   ? gabapentin (NEURONTIN) 100 mg capsule Take 1 capsule (100 mg total) by mouth three (3) times daily.   ? gabapentin 100 mg capsule .   ? hydrocortisone 1% cream Apply topically.   ? levothyroxine 100 mcg tablet take 1 tablet by mouth every morning before breakfast.   ?  MECLIZINE 25 mg tablet take 1 tablet by mouth every 6 hours if needed for nausea   ? olmesartan-hydroCHLOROthiazide 40-25 mg tablet Take 0.5 tablets by mouth daily.   ? RA ASPIRIN EC 81 MG EC tablet take 1 tablet by mouth once daily   ? atorvastatin 10 mg tablet Take 1 tablet (10 mg total) by mouth daily. (Patient not taking: Reported on 02/01/2021.)   ? gabapentin 300 mg capsule take 1 capsule by mouth five times a day (Patient not taking: Reported on 02/01/2021.)   ? LORazepam 0.5 mg tablet Take 1 tablet (0.5 mg total) by mouth daily as needed for Anxiety. Max Daily Amount: 0.5 mg (Patient not taking: Reported on 02/01/2021.)   ? medroxyPROGESTERone 10 mg tablet Take 1 tablet (10 mg total) by mouth daily. (Patient not taking: Reported on 02/01/2021.)   ? methylPREDNISolone 32 mg tablet Take Methylprednisolone 32 mg x1 tablet 12 hours prior to contrast administration then take Methylprednisolone 32 mg x1 tablet 2 hours prior to contrast administration.. (Patient not taking: Reported on 02/01/2021.)     No facility-administered medications prior to visit.       Social History     Socioeconomic History   ? Marital status: Single   Tobacco Use   ? Smoking status: Former Smoker     Packs/day: 0.25     Years: 5.00     Pack years: 1.25     Types: Cigarettes   ? Smokeless tobacco: Never Used   Vaping Use   ? Vaping Use: Never used   Substance and Sexual Activity   ? Alcohol use: Not Currently   ? Drug use: Not Currently     No family history on file.    There were no vitals filed for this visit.  EXAM  General: No apparent distress, NCAT, Awake  HEENT: anicteric sclera clear oropharynx  Extremities: no clubbing cyanosis or edema    NEURO  Mental Status: AOx4, follows commands,   Fluent language with comprehension, repetition and naming intact    Cranial Nerves: EOMI, face symmetric to eye closure and symmetric smile, palate elevates symmetrically, tongue is midline without atrophy    Motor: neck brace in place.  Tremor - none  Fine Motor - intact to fingertaps  Tone - UTA  Bulk - normal  No drift    Reflexes:   UTA    Coordination: intact Finger to Nose    Sensory UTA    Station: sitting in car      IMAGING  MRI Brain  10/27/20    No acute infarct or intracranial hemorrhage.  Nonspecific FLAIR hyperintensity within the posterior insular cortex, without interval progression. The overall appearance is grossly unchanged from the prior study.   ?    MRI/A Brain [facility] 08/2020    MRI BRAIN:   1.  No acute infarct or intracranial hemorrhage.  2.  Nonspecific FLAIR hyperintensity within the posterior insular cortex, which may represent evolving subacute to chronic infarct.  Consider 1 month follow-up to assess temporal change.  ?  MRA BRAIN: No proximal large vessel occlusion or high grade stenosis.  ?  MRA NECK: No occlusion or high grade stenosis.     MRI C spine 06/2020    ?  There are multilevel degenerative changes throughout cervical spine as described, greatest at C5-C6, where there is is posterior disc protrusion resulting in severe central canal stenosis with indentation of ventral cord. There is mild patchy T2 hyperintense signal within the cord  at this level which may represent edema or myelomalacia. There are also moderate bilateral foraminal stenosis at this level.  ?  MRI Brain 12/2019 [facility]    Mild Microvascular ischemic gliosis.     Otherwise unremarkable MRI the brain.     MRI C spine 09/2019 [facility]    1.  Multilevel degenerative changes most pronounced at C5-C6 where there is trace   retrolisthesis with a central disk/osteophyte protrusion impinging the spinal cord with   severe spinal canal stenosis. There is mild facet arthropathy and bilateral   uncovertebral hypertrophy causing severe bilateral foraminal stenosis. This affects the exiting   bilateral C6 nerve roots. This level is unchanged.     2. Reversal of the cervical lordosis suggesting muscle spasm and/or degenerative   changes.     1.  Multilevel degenerative changes most pronounced at C5-C6 where there is trace   retrolisthesis with a central disk/osteophyte protrusion impinging the spinal cord with   severe spinal canal stenosis. There is mild facet arthropathy and bilateral   uncovertebral hypertrophy causing severe bilateral foraminal stenosis. This affects the exiting   bilateral C6 nerve roots. This level is unchanged.     2. Reversal of the cervical lordosis suggesting muscle spasm and/or degenerative   changes.       IMPRESSION AND RECOMMENDATIONS  61 y.o. RH lady with myelopathy (Cervical), myoclonus, possible stroke, headache, poor balance and anxiety.    - For possible insular ischemic stroke, cont asa, encouraged at least 10 if not 80mg  lipitor for goal LDL 70. For secondary stroke prevention goal LDL< 70, Goal BP 120/80, Goal A1c<5.7. Recommend low glucose low carbohydrate Mediterranean diet and exercise. Consider FU w repeat MRI Brain as well in 6-3mo. Consider TTE and ziopatch w PMD or cardiology for full workup.    - headaches have now resolved. May have been tension vs migraine w prior post concussive component.    - dizziness/poor balance - may have a component of neuropathy suggest FU in person given description. A1c 6.6. consider neuropathy labs after sensory examination if indicated. Lately improved w meclizine she reports.    - Cervical spine disease; myelomalacia 2/2 canal stenosis w possible myoclonus preceding report of neck pain. Reevaluate myoclonus after surgery, though now she states it abated in 2019. May alternatively have been stress response vs tic disorder given association w stress though no other characteristics thereof per history. PT per neurosurgery. They will send videos from 2019.    - anxiety FU c PMD/Psych and consider SSRI and psychotherapy    40 minutes were spent personally by me today on this encounter which include today's pre-visit review of the chart, obtaining appropriate history, performing an evaluation, documentation and discussion of management with details supported within the note for today's visit. The time documented was exclusive of any time spent on the separately billed procedure.      Delania Ferg C. Jamelah Sitzer

## 2021-02-07 ENCOUNTER — Ambulatory Visit: Payer: MEDICAID

## 2021-02-08 ENCOUNTER — Ambulatory Visit: Payer: MEDICAID

## 2021-02-11 NOTE — Telephone Encounter
Tried calling [facility] [name] no answer L/M on voicemail to please call the office and schedule an appointment per Dr. Minna Antis.

## 2021-02-18 ENCOUNTER — Ambulatory Visit: Payer: MEDICAID

## 2021-02-18 DIAGNOSIS — L249 Irritant contact dermatitis, unspecified cause: Secondary | ICD-10-CM

## 2021-02-18 DIAGNOSIS — R030 Elevated blood-pressure reading, without diagnosis of hypertension: Secondary | ICD-10-CM

## 2021-02-18 DIAGNOSIS — M7130 Other bursal cyst, unspecified site: Secondary | ICD-10-CM

## 2021-02-18 DIAGNOSIS — D492 Neoplasm of unspecified behavior of bone, soft tissue, and skin: Secondary | ICD-10-CM

## 2021-02-18 DIAGNOSIS — F419 Anxiety disorder, unspecified: Secondary | ICD-10-CM

## 2021-02-18 MED ORDER — CLINDAMYCIN PHOSPHATE 1 % EX GEL
2 refills | Status: AC
Start: 2021-02-18 — End: ?

## 2021-02-18 MED ORDER — EUCRISA 2 % EX OINT
6 refills | Status: AC
Start: 2021-02-18 — End: ?

## 2021-02-18 NOTE — Progress Notes
DERMATOLOGY ATTENDING ESTABLISHED PATIENT NOTE    PATIENT: Chelsea [name]  MRN: [id number]  DOB: 11-Aug-1960  DATE OF SERVICE: 02/18/2021    REFERRING PRACTITIONER: Clint Lipps, DO  PRIMARY CARE PROVIDER: Clint Lipps, DO  CHIEF COMPLAINT:   Chief Complaint   Patient presents with   ? Follow-up       Visit start time: 12:41 PM     Subjective (click to expand/collapse)    Chelsea [name] is a 60 y.o. year old who presents for hand dermatitis follow up.    Last seen: by Dr. Allena Katz 12/01/2020 (telehealth)    This started January 2022. Has improved with clobetasol ointment, but then it recurs. Everything was really good in July, stopped treatments, but continued emollients, but now starting up again.      Also complaining of lesion in left ear. For over a year. Someone froze it once, but it has recurred. Very tender. Wondering what can be done about it.     Swelling in right third finger - wondering what this is about.    PMH:   Past Medical History:   Diagnosis Date   ? Abnormal cardiovascular stress test 06/21/2020    06/21/2020 Preop exam showed abnormal stress test with no EKG e/o ischemia but e/o large anterior wall ischemia with recommendation for cardiac catheterization. Had second opinion with repeat exercise nuclear stress test showing no e/o ischemia and was recommended against cardiac catheterization. I recommended starting Atorvastatin 40 mg every day for her DM2, but patient wishes to repeat labs fir   ? Diabetes mellitus (HCC/RAF) 02/20/2017    06/21/2020 Chronic, stable. Last HgA1c 6.6% on 06/03/19. C/w MFM 500 mg BID. Repeat HgA1c. Recommended starting Atorvastatin 40 mg every day but patient wishes to repeat labs first. Will need to clarify last retinopathy screening at next visit.    ? Hypertension, essential 06/21/2020    06/21/2020 Chronic,s table. C/w Benicar 40/25 mg x1/2 tablet every day. Reassess at next in office appointment.    ? Hypothyroidism 06/21/2020   ? Myelopathy concurrent with and due to spinal stenosis of cervical region (HCC/RAF) 06/21/2020    06/21/2020 Radiculopathy and myelopathy symptoms since forehead injury 08/25/19. MRI cervical spine 09/30/19 showing multilevel degenerative changes and central disk/osteophyte protrusion impinging the spinal cord with severe spinal stenosis and severe BL foraminal stenosis. Originally recommended for surgical intervention, which was then cancelled due to abnormal preoperative stress test. Neurosurge   ? OSA (obstructive sleep apnea) 06/21/2020    06/21/2020 Chronic, stable. C/w CPAP at bedtime.         Outpatient Medications Marked as Taking for the 02/18/21 encounter (Office Visit) with Arbutus Ped, MD   Medication   ? ciclopirox 8% solution   ? clobetasol 0.05% ointment   ? clobetasol 0.05% ointment   ? clobetasol 0.05% ointment   ? cyclobenzaprine 10 mg tablet   ? diclofenac Sodium 1% gel   ? diphenhydrAMINE 50 mg capsule   ? gabapentin (NEURONTIN) 100 mg capsule   ? gabapentin 100 mg capsule   ? hydrocortisone 1% cream   ? levothyroxine 100 mcg tablet   ? MECLIZINE 25 mg tablet   ? olmesartan-hydroCHLOROthiazide 40-25 mg tablet   ? RA ASPIRIN EC 81 MG EC tablet        Allergies   Allergen Reactions   ? Iodinated Diagnostic Agents Anaphylaxis           Objective Expanded (click to expand/collapse)    BP 135/74  ~ Pulse 63  ~  Temp 36.3 ?C (97.4 ?F) (Tympanic)  ~ Resp 18  ~ SpO2 98%      General:   alert, appears stated age and cooperative     Neurologic:   Grossly normal   Psychiatric:   oriented to time, place and person, mood and affect are within normal limits     Skin Exam:  Skin Type: 5  Head/face: examined  L upper extremity: examined  R upper extremity: examined  All areas examined were within normal limits with the following exceptions:     Objective   Right Distal 3rd Finger:  Compressible fluid filled appearing nodule - edema and mild erythema of DIP joint.    Objective   Left Dorsal Hand:  Thin scaly plaques bilateral dorsal hands with micro fissures    Objective Left Inferior Crus of Antihelix:  Skin colored to hyperpigmented papule/nodule - no pearly border. No keratotic scale.               Assessment & Plan      1. Myxoid cyst  Right Distal 3rd Finger        Referral to Orthopaedic Surgery, Hand - Right Distal 3rd Finger    2. Irritant hand dermatitis  Left Dorsal Hand    Chronic condition - though just started 06/2020 per patient.   Hand care reviewed and printed for patient in AVS.  Continue clboetasol ointment prn flares, but can try eucrisa ointment twice daily for more continuous steroid sparing treatment.     Encouraged use of gloves when doing housework to reduce irritants.     Crisaborole (EUCRISA) 2 % OINT - Left Dorsal Hand  Apply to affected areas twice daily.    3. Neoplasm of unspecified behavior of bone, soft tissue, and skin  Left Inferior Crus of Antihelix    clindamycin 1% gel  APPLY TO AFFECTED AREA(S) TWICE DAILY - for ear.    Could be epidermal inclusion cyst with tenderness. Chondrodermatitis nodularis helicis is also possible, though it is more internally situated.   - try clindamycin gel twice daily on the area.   - offered shave biopsy/removal, but patient declined today due to upcoming dental work.     Of note, patient was concerned about her high blood pressure. She had her own machine that was reading higher than the office machine. She was referred back to her primary care physician for further care.    Assessment and plan were discussed with the patient.            Follow up: prn    Author:  Arbutus Ped 02/18/2021 12:41 PM

## 2021-02-18 NOTE — Patient Instructions
Gentle hand care:    Make sure hands are completely dry after washing as remaining water and soap can irritate the skin and nails.    Moisturize often with a fragrance free hand cream. [employer]rogena Norwegian formula is a good one.    If needed, consider using vaseline petroleum jelly or aquaphor under cotton gloves at night to help with itchy rashes on the hands.    When doing dishes or housework, wear gloves.

## 2021-02-19 MED ORDER — MAGNESIUM GLYCINATE 100 MG PO CAPS
100 mg | ORAL_CAPSULE | Freq: Every evening | ORAL | 0 refills | Status: AC
Start: 2021-02-19 — End: ?

## 2021-02-19 MED ORDER — HYDROXYZINE HCL 25 MG PO TABS
25 mg | ORAL_TABLET | Freq: Three times a day (TID) | ORAL | 1 refills | Status: AC | PRN
Start: 2021-02-19 — End: ?

## 2021-02-19 NOTE — Progress Notes
[facility]  VISIT DATE: 02/18/2021    MRN: [id number]  DOB: 1961/04/30       SUBJECTIVE   Chelsea [name] is a 60 y.o. female had concerns including Hypertension and Dizziness.  Elevated BP reads at home but is unsure if cuff fits well  170s/110s but feels anxious when she checks   Compliant with meds  Patient denies chest pain, SOB, palpitations, lightheadedness, exercise intolerance, BLE edema    ROS: 14 points ROS non-contributory/neg except noted as above.    ALLX Reviewed  MEDS Reviewed      OBJECTIVE   VS: BP 154/85  ~ Pulse 86  ~ Temp 37.1 ?C (98.8 ?F) (Tympanic)  ~ Resp 18  ~ SpO2 94%     General: Well developed, well nourished, in no acute discomfort  HEENT: EOMI, bilateral EACs non-erythematous and non-edematous without debris, TMs visible without erythema or bulging, no perforation  Psychologic:  Normal speech, mood, affect, intact memory  Respiratory: Breathing non labored and non dyspneic  Cardiovascular: RRR, no murmurs, 1+ peripheral edema   Gastrointestinal: non-distended  Skin: No visible rashes    Labs Reviewed:  Results for orders placed or performed in visit on 02/07/21   PTH, Intact   Result Value Ref Range    PTH, Intact 38 11 - 51 pg/mL     Comment: Ingestion of high levels of biotin in dietary supplements may lead to falsely decreased results.   Calcium for PTH   Result Value Ref Range    Calcium 9.4 8.6 - 10.4 mg/dL   HBS Antigen   Result Value Ref Range    HBs Ag Nonreactive Nonreactive       Imaging Reviewed:   No results found.    ASSESSMENT & PLAN       Assessment and Plan:    Elevated blood pressure reading  -     ECG [facility] Performed    Anxiety  -     ECG 12-[facility] Performed    Other orders  -     hydrOXYzine 25 mg tablet  -     Magnesium Glycinate 100 MG CAPS      EKG NSR no ischemic changes  Elevated BPs in part due to anxiety due to life stressors - anxious on exam  BP acceptable in IC  May increase combo BP pill from 1/2 to full tab; keep log and FU PCP  If card sx develop present to ED  Stress control and other lifestyle changes discussed  Follow up with PCP in 5-7 days  Emergency department precautions thoroughly reviewed with patient regarding aforementioned assessment. If any clinical worsening whatsoever in the meantime, patient instructed to present to nearest emergency department. Patient expresses understanding.     The above plan of care, diagnosis, orders, and follow-up were discussed with the patient, and the patient demonstrated full understanding. Questions related to this recommended plan of care were answered.    Kennith Maes, DO  Family Medicine/Immediate Care

## 2021-02-24 ENCOUNTER — Telehealth: Payer: MEDICAID

## 2021-02-24 NOTE — Telephone Encounter
LOV: 02/18/2021  Please advise

## 2021-02-24 NOTE — Telephone Encounter
Call Back Request      Reason for call back: Pt called in requesting to speak with the Urgent Care dr she saw on 09/02. Please contact her to discuss her BP and medications.    Any Symptoms:  '[]'$  Yes  '[x]'$  No       If yes, what symptoms are you experiencing:    o Duration of symptoms (how long):    o Have you taken medication for symptoms (OTC or Rx):      Patient or caller has been notified of the 24-48 hour turnaround time. Yes, urgent.

## 2021-02-27 NOTE — Telephone Encounter
Informed she should continue to check Bps and keep log, re counceled on lifestyle changes, Patient denies chest pain, SOB, palpitations, lightheadedness, exercise intolerance, BLE edema  See PCP at next available appt

## 2021-02-28 ENCOUNTER — Ambulatory Visit: Payer: MEDICAID

## 2021-02-28 DIAGNOSIS — E119 Type 2 diabetes mellitus without complications: Secondary | ICD-10-CM

## 2021-02-28 NOTE — Progress Notes
PATIENT: Chelsea [name]  MRN: [id number]  DOB: Sep 28, 1960  DATE OF SERVICE: 02/28/2021    CHIEF COMPLAINT:   Chief Complaint   Patient presents with   ? Hypertension        HPI   Chelsea [name] is a 60 y.o. female who  has a past medical history of Abnormal cardiovascular stress test (06/21/2020), Diabetes mellitus (HCC/RAF) (02/20/2017), Hypertension, essential (06/21/2020), Hypothyroidism (06/21/2020), Myelopathy concurrent with and due to spinal stenosis of cervical region (HCC/RAF) (06/21/2020), and OSA (obstructive sleep apnea) (06/21/2020). who presents for   Chief Complaint   Patient presents with   ? Hypertension   Patient arrived 40 minutes late for a 15 minute appointment    #HTN  On Benicar 40/25 mg x1/2 tablet every day  Had some home BP's up to 170s  ?  Cervical myeloradiculopathy  Onset since forehead injury 08/25/19, with evidence of cervical spinal stenosis on imaging. ?Her symptoms included severe neck pain, imbalance, headaches, and tingling in the hands. ?Addressed thoroughly with Dr. Vickki Hearing. ?  S/p C7-T1 interlaminar ESI on 07/28/20 with initial relief but tapered over course of a month. ?Imaging demonstrates multilevel degeneration with cervical stenosis most severe at C5/6.??  Planning on surgical intervention with neurosurg Dr. Konrad Felix  11/22/2020?Scheduled for surgical intervention on 01/05/21 with Dr. Konrad Felix  01/21/2021 Presents in a brace. Needs to go for disposition. Would like muscle relaxants.   03/01/2021 Spine surgery had to be rescheduled 2/2 dental infection requiring dental work. Now rescheduled for 04/26/21. Still with residual relief from injection by Dr. Vickki Hearing.   ?  #Hypercholesterolemia with elevated ASCVD risk  Patient presents with acute intermittent disorientation with severe headaches since presumably 09/06/20. ?She reports forgetting her birthday month. ?She does have chronic history of neck pain, headaches, imbalance, and peripheral neuropathy due to her cervical spine disease, further below. ?  Also +history of concussions with forehead trauma. ?She feels her forehead still looks very different to her.  She was seen at urgent care on the same day, and has since had two [facility] visits on 09/07/20 and earlier today 09/09/20.  MR brain shows possible evolving subacute to chronic infarct within the posterior insular cortex. ?She is already started on atorvastatin. ?She plans to repeat imaging in 1 month as advised   Today, she reports headaches have stopped. ?Dizziness improved with sitting down and rest. ?She feels it is possible from doing too much but she is very overwhelmed. ?She has to sleep upright due to neck pain  09/22/2020?Started on ASA 81 mg every day + Atorvastatin 40 mg every day at last visit.?She has not been taking the Atorvastatin. She has been taking the aspirin. Has an appointment with neuro on 10/25/20.?  11/22/2020?Seen by neuro who recommended continuing ASA and statin therapy and repeating MRI brain?w/ and w/o contrast.??She has a h/o allergy to iodinated contrast.?Seen by Cards 11/17/20 as well.  01/21/2021 MRI brain 11/23/20?with stable appearance of nonspecific FLAIR hyperintense signal within the posterior right insular cortex  03/01/2021 Still not taking statin.     #Anxiety  Very anxious esp in s/o her chronic neck pain, headaches, and confusion  History of 20y disability due to severe panic disorder?  03/01/2021 Started on Hydroxyzine during ER visit. Reports that it helps.     #DM2  Diagnosed with HgA1c 9%.?  Initially treated with MFM then lost significant weight  11/22/2020?HgA1c 6.6% on 08/2020. Diet controlled  01/21/2021 HgA1c 6.2%. Diet controlled. Retinopathy negative 2 weeks ago.   03/01/2021 Declines pneumococcal  vaccine. Due for retinopathy screening.?  ?  Not addressed this visit:?  #Hypothyroidism  S/p RAI in 30's.?On LT4.Marland Kitchen  11/22/2020?Euthyroid 09/07/20  ?  ?#Fibroids  Planning?on exploring UFE with IR  S/p repeat EMB 11/11/20 with pathology negative for malignancy?      #Obesity  She reports interval weight loss of 9 pounds - today's weight is 243 lbs  In weight loss program  ?  #OSA  Remains on CPAP at bedtime  ?  #Recurrent severe headaches  Following with Neurology who suspected TTH vs migraine       Patient Care Team:  Clint Lipps, DO as PCP - General (Family Medicine)  Clint Lipps, DO as PCP - Plan Assigned (Family Medicine)    MEDS     Outpatient Medications Prior to Visit   Medication Sig   ? ciclopirox 8% solution Apply topically at bedtime Apply to adjacent skin and affected nails daily. Remove with alcohol every 7 days. Use up to 48 weeks..   ? clindamycin 1% gel APPLY TO AFFECTED AREA(S) TWICE DAILY - for ear.   ? clobetasol 0.05% ointment Apply topically two (2) times daily APPLY AND GENTLY MASSAGE INTO AFFECTED AREA(S) TWICE DAILY. Do not apply to face, armpit, or groin..   ? clobetasol 0.05% ointment Apply topically two (2) times daily APPLY AND GENTLY MASSAGE INTO AFFECTED AREA(S) TWICE DAILY. Do not apply to face, armpit, or groin..   ? clobetasol 0.05% ointment Apply topically two (2) times daily APPLY AND GENTLY MASSAGE INTO AFFECTED AREA(S) TWICE DAILY. Do not apply to face, armpit, or groin..   ? Crisaborole (EUCRISA) 2 % OINT Apply to affected areas twice daily.   ? cyclobenzaprine 10 mg tablet Take 1 tablet (10 mg total) by mouth every eight (8) hours as needed for Muscle spasms.   ? diclofenac Sodium 1% gel apply 2 to 3 gram twice a day if needed   ? diphenhydrAMINE 50 mg capsule Take 50 mg benadryl by mouth 1 hour prior to contrast administration.   ? gabapentin (NEURONTIN) 100 mg capsule Take 1 capsule (100 mg total) by mouth three (3) times daily.   ? gabapentin 100 mg capsule .   ? hydrocortisone 1% cream Apply topically.   ? hydrOXYzine 25 mg tablet Take 1 tablet (25 mg total) by mouth three (3) times daily as needed for Anxiety.   ? levothyroxine 100 mcg tablet take 1 tablet by mouth every morning before breakfast.   ? Magnesium Glycinate 100 MG CAPS Take 100 mg by mouth at bedtime.   ? MECLIZINE 25 mg tablet take 1 tablet by mouth every 6 hours if needed for nausea   ? olmesartan-hydroCHLOROthiazide 40-25 mg tablet Take 0.5 tablets by mouth daily.     No facility-administered medications prior to visit.       PHYSICAL EXAM      Last Recorded Vital Signs:    02/28/21 1006   BP: 133/80   Pulse: 71   Resp: 17   Temp: 37.1 ?C (98.7 ?F)   SpO2: 98%     Body mass index is 47.52 kg/m?Marland Kitchen  Wt Readings from Last 3 Encounters:   02/28/21 (!) 259 lb 12.8 oz (117.8 kg)   02/01/21 (!) 255 lb (115.7 kg)   01/14/21 (!) 255 lb 6.4 oz (115.8 kg)      Gen - Awake. No acute distress. Age appropriate appearance. Sitting with neck brace.   Eyes - EOMI. No conjunctival pallor or injection. No scleral icterus.  Pulm - Nml effort. Clear to auscultation bilaterally. No wheezes, rales, or rhonchi.  Cards - RRR. S1 S2. No murmurs, rubs, or gallops.   MSK - No cyanosis, clubbing, or LE edema.     LABS/STUDIES   I have:   []  Reviewed/ordered []  1 []  2 []  ? 3 unique laboratory, radiology, and/or diagnostic tests noted below    []  Reviewed []  1 []  2 []  ? 3 prior external notes and incorporated into patient assessment    Neuro Dr. Nancy Marus 02/01/21  []  Discussed management or test interpretation with external provider(s) as noted       Lab Studies:  CBC:   Results for orders placed or performed in visit on 01/07/21   CBC   Result Value Ref Range    White Blood Cell Count 7.52 4.16 - 9.95 x10E3/uL    Red Blood Cell Count 5.04 3.96 - 5.09 x10E6/uL    Hemoglobin 14.7 11.6 - 15.2 g/dL    Hematocrit 29.5 (H) 34.9 - 45.2 %    Mean Corpuscular Volume 91.5 79.3 - 98.6 fL    Mean Corpuscular Hemoglobin 29.2 26.4 - 33.4 pg    MCH Concentration 31.9 31.5 - 35.5 g/dL    Red Cell Distribution Width-SD 42.5 36.9 - 48.3 fL    Red Cell Distribution Width-CV 12.7 11.1 - 15.5 %    Platelet Count, Auto 288 143 - 398 x10E3/uL    Mean Platelet Volume 10.1 9.3 - 13.0 fL    Nucleated RBC%, automated 0.0 No Ref. Range %    Absolute Nucleated RBC Count 0.00 0.00 - 0.00 x10E3/uL    Neutrophil Abs (Prelim) 5.09 See Absolute Neut Ct. x10E3/uL   Differential, Automated   Result Value Ref Range    Neutrophil Percent, Auto 67.7 No Ref. Range %    Lymphocyte Percent, Auto 23.5 No Ref. Range %    Monocyte Percent, Auto 6.1 No Ref. Range %    Eosinophil Percent, Auto 1.7 No Ref. Range %    Basophil Percent, Auto 0.7 No Ref. Range %    Immature Granulocytes% 0.3 No Reference Range %    Absolute Neut Count 5.09 1.80 - 6.90 x10E3/uL    Absolute Lymphocyte Count 1.77 1.30 - 3.40 x10E3/uL    Absolute Mono Count 0.46 0.20 - 0.80 x10E3/uL    Absolute Eos Count 0.13 0.00 - 0.50 x10E3/uL    Absolute Baso Count 0.05 0.00 - 0.10 x10E3/uL    Absolute Immature Gran Count 0.02 0.00 - 0.04 x10E3/uL   CBC & Auto Differential    Narrative    The following orders were created for panel order CBC & Auto Differential.  Procedure                               Abnormality         Status                     ---------                               -----------         ------                     AOZ[[id number]]  Abnormal            Final result               Differential, Automated[[id number]]                          Final result                 Please view results for these tests on the individual orders.       CMP:   Results for orders placed or performed in visit on 01/07/21   Comprehensive Metabolic Panel   Result Value Ref Range    Sodium 140 135 - 146 mmol/L    Potassium 3.7 3.6 - 5.3 mmol/L    Chloride 99 96 - 106 mmol/L    Total CO2 31 (H) 20 - 30 mmol/L    Anion Gap 10 8 - 19 mmol/L    Glucose 153 (H) 65 - 99 mg/dL    Creatinine 9.56 2.13 - 1.30 mg/dL    Estimated GFR >08 See GFR Additional Information mL/min/1.33m2    GFR Additional Information See Comment     Urea Nitrogen 11 7 - 22 mg/dL    Calcium 65.7 (H) 8.6 - 10.4 mg/dL    Total Protein 7.6 6.1 - 8.2 g/dL    Albumin 4.3 3.9 - 5.0 g/dL    Bilirubin,Total 0.3 0.1 - 1.2 mg/dL    Alkaline Phosphatase 88 37 - 113 U/L    Aspartate Aminotransferase 18 13 - 62 U/L    Alanine Aminotransferase 18 8 - 70 U/L     Hgb A1C:   Lab Results   Component Value Date/Time    HGBA1C 6.2 (H) 01/07/2021 12:54 PM     Lipids:   Results for orders placed or performed in visit on 01/07/21   Lipid Panel   Result Value Ref Range    Cholesterol 186 See Comment mg/dL    Cholesterol,LDL,Calc 111 (H) <100 mg/dL    Cholesterol, HDL 42 (L) >50 mg/dL    Triglycerides 846 (H) <150 mg/dL    Non-HDL,Chol,Calc 962 (H) <130 mg/dL      TSH:   Lab Results   Component Value Date/Time    TSH 1.6 09/07/2020 01:13 PM          A&P   Shamarra Warda is a 60 y.o. female presenting for   Chief Complaint   Patient presents with   ? Hypertension         PROBLEM & ORDERS    ICD-10-CM    1. Hypertension, essential  I10    2. Type 2 diabetes mellitus without complication, without long-term current use of insulin (HCC/RAF)  E11.9 RETINAL FUNDUS PHOTO   3. GAD (generalized anxiety disorder)  F41.1    4. Myelopathy concurrent with and due to spinal stenosis of cervical region (HCC/RAF)  M48.02     G99.2    5. Candidate for statin therapy due to risk of future cardiovascular event  Z91.89        ASSESSMENT  Problem List Items Addressed This Visit        HCC Codes    ? Diabetes mellitus (HCC/RAF)     Overview      06/21/2020 Chronic, stable. Last HgA1c 6.6% on 06/03/19. C/w MFM 500 mg BID. Repeat HgA1c. Recommended starting Atorvastatin 40 mg every day but patient wishes to repeat labs first. Will need to clarify last retinopathy screening at next  visit.   07/06/2020 Chronic, stable. HgA1c 6.7% on 06/29/20 with negative UACR. Recommended resuming MFM XR 500 mg every day, declined at this time. Start Atorvastatin 20 mg every day with plans to uptitrate. Recommended pneumococcal vaccine, which was declined at this time. Ophtho referral for retinopathy screening.   07/16/2020 Chronic, stable. Did not resume MFM XR 500 mg every day. Reiterated my recommendation to restart MFM 500 mg every day. Recommended restarted Atorvastatin 20 mg every day.   08/31/2020 Chronic, stable. Patient declines medication. C/w diet and exercise. Has appt for retinopathy screening at [facility]. Recommended PPSV23, which was declined. Recommended moderate to high intesnity statin, which was declined. Therefore will start Atorvastatin 10 mg every day.   11/22/2020 Chronic, stable. HgA1c 6.6% on 09/14/20. C/w diet and exercise. C/w Atorvastatin 10 mg every day (recommended at least 40 mg every day, which was refused).   01/14/2021 Chronic, stable. HgA1c 6.2% 01/07/21. C/w diet and exercise. Recommended daily adherence with Atorvastatin.   03/01/2021 Chronic, stable. Diet controlled. Rec pneumococcal vaccine - declined. Recommended Atorvastatin - declined. Retinopathy screening ordered.            ? Myelopathy concurrent with and due to spinal stenosis of cervical region (HCC/RAF)     Overview      06/21/2020 Radiculopathy and myelopathy symptoms since forehead injury 08/25/19. MRI cervical spine 09/30/19 showing multilevel degenerative changes and central disk/osteophyte protrusion impinging the spinal cord with severe spinal stenosis and severe BL foraminal stenosis. Originally recommended for surgical intervention, which was then cancelled due to abnormal preoperative stress test. Neurosurgery referral provided today. She has had a h/o difficult intubation in the past.   07/06/2020 Chronic, stable. Evaluated by neurosurg with plans to repeat MRI in 1 month and consider surgical intervention. Seen by PMR who prescribed medrol dose pack. C/w Gabapentin.   07/16/2020 Chronic, stable. MRI cervical spine with moderate to severe spinal canal stenosis and mild increased T2 signal suggestive of cord edema. Medrol dosepack and Meloxicam 7.5 mg every day + Gabapentin for pain control. Recommended urgent f/u with Neurosurg.   09/10/2020  Chronic, stable. S/p ESI by Dr. Vickki Hearing on 07/28/20 with significant improvement. Following with neurosurgery and pmr on gabapentin.  11/22/2020 Chronic, stable. Planning on surgical intervention with Dr. Konrad Felix on 01/05/21.   01/14/2021 Chronic, stable. C/w neck brace. Flexeril 10 mg TID PRN muscle spasm. F/U with neurosurgery for planned surgical intervention on 01/05/21.   03/01/2021 Chronic, stable. Surgery rescheduled 2/2 dental infection. C/w neck brace, Flexeril 10 mg TID PRN muscle spasm, gabapentin.               Other    ? Candidate for statin therapy due to risk of future cardiovascular event     Overview      09/10/2020  Chronic, stable. Start Atorvastatin 40 mg every day given DM2 + age + abnormal MRI brain finding c/f possible evolving subacute to chronic infarct. Will start ASA 81 mg every day as well and recommended f/u with neurology to discuss.   09/22/2020 Chronic, stable. Patient is very hesitant to take cholesterol lowering medication despite counseling on indication with DM2 + age > 40 and possible subacute to chronic infarct on MRI brain. Patient agreeable to start Atorvastatin 10 mg every day, c/w ASA 81 mg every day, and f/u with neurology. Repeat MRI brain previously ordered.   11/22/2020 Chronic, stable. Poor compliance with statin therapy. C/w Atorvastatin 10 mg every day (recommended at least 40 mg every day but  patient refuses). C/w ASA 81 mg every day for secondary prevention of possible lacunar stroke on MRI brain.   03/01/2021 Chronic, stable. Again recommended mod-high intensity statin given DM2 + Age > 40 and abnormal MRI w/ c/f possible lacunar stroke. C/w ASA 81 mg every day for secondary prevention. Following with neurology.            ? GAD (generalized anxiety disorder)     Overview      Chronic, stable. Recommended SSRI, declined. C/w Hydroxyzine PRN anxiety. Also on Gabapentin for cervical radiculopathy.            ? Hypertension, essential     Overview      Chronic, stable. BP at goal. C/w Benicar 40/25 mg x1/2 tablet every day. Suspect episodes of elevated home BP are related to anxiety.                    Diagnoses and all orders for this visit:    Hypertension, essential    Type 2 diabetes mellitus without complication, without long-term current use of insulin (HCC/RAF)  -     RETINAL FUNDUS PHOTO; Future    GAD (generalized anxiety disorder)    Myelopathy concurrent with and due to spinal stenosis of cervical region (HCC/RAF)    Candidate for statin therapy due to risk of future cardiovascular event      Orders Placed This Encounter   ? RETINAL FUNDUS PHOTO         The above recommendation were discussed with the patient.  The patient has all questions answered satisfactorily and is in agreement with this recommended plan of care.        Clint Lipps, DO  Clinical Instructor  Department of Medicine  03/01/2021 9:09 AM

## 2021-02-28 NOTE — Telephone Encounter
Pt already seen by MD

## 2021-02-28 NOTE — Patient Instructions
Consider lexapro (esitalopram) to help with your anxiety

## 2021-03-01 ENCOUNTER — Telehealth: Payer: MEDICAID

## 2021-03-01 DIAGNOSIS — F411 Generalized anxiety disorder: Secondary | ICD-10-CM

## 2021-03-01 NOTE — Telephone Encounter
Call Back Request      Reason for call back: Pt called to ask to speak with someone in Dr Lenard Galloway' office. Pt under impression she had a visit in the month of July to discuss postponing her surgery. Advised Pt no appts shown between March and August of this year. Pt would like to have a copy of the doctor's notes as to why her surgery was postponed. Please call Pt back to advise. Thank you.     Any Symptoms:  '[]'$  Yes  '[x]'$  No       If yes, what symptoms are you experiencing:    o Duration of symptoms (how long):    o Have you taken medication for symptoms (OTC or Rx):      Patient or caller has been notified of the 24-48 hour turnaround time.

## 2021-03-01 NOTE — Telephone Encounter
Call Back Request      Reason for call back: Pt looking to speak with Crystal in regards to her medical records. Says she was previously scheduled for surgery 01/05/21; however, unable to see that and the reason the surgery was cancelled (due to dental work) in her Pharmacist, community. Pt was under the impression that's something she would be able to view through MyChart but not seeing it. She's also requesting the note from Shanon Brow NP (encounter 12/13/20 at 2:10pm) be visible to her in [place] as well. Pt requesting call back in regards.     CBN [phone]    Any Symptoms:  '[]'$  Yes  '[x]'$  No       If yes, what symptoms are you experiencing:    o Duration of symptoms (how long):    o Have you taken medication for symptoms (OTC or Rx):      Patient or caller has been notified of the 24-48 hour turnaround time.

## 2021-03-02 NOTE — Telephone Encounter
Forwarded by: Everline Mahaffy C. Tyller Bowlby

## 2021-03-02 NOTE — Telephone Encounter
Reply by: Sande Brothers  Called and spoke with patient. Pt wanted clarification of her old surgery date which was provided to patient.

## 2021-03-07 MED ORDER — LEVOTHYROXINE SODIUM 100 MCG PO TABS
ORAL_TABLET | ORAL | 1 refills | 60.00000 days
Start: 2021-03-07 — End: ?

## 2021-03-08 MED ORDER — LEVOTHYROXINE SODIUM 100 MCG PO TABS
ORAL_TABLET | ORAL | 0 refills | 60.00000 days | Status: AC
Start: 2021-03-08 — End: ?

## 2021-03-10 ENCOUNTER — Telehealth: Payer: MEDICAID

## 2021-03-11 NOTE — Telephone Encounter
Called pt and made her aware we received orders from [facility] for diabetic supplies. Aafter reviewing request with Dr Minna Antis, pt is not taking Diabetic medication and therefore does not qualify for diabetic supplies. Patient doesn't know why that request was sent if she didn't request it neither. Only thing she recalls from [facility] is the CPAP supplies. Patient aware order will not be filled out for diabetic supplies.

## 2021-03-21 ENCOUNTER — Ambulatory Visit: Payer: MEDICAID

## 2021-03-24 ENCOUNTER — Ambulatory Visit: Payer: MEDICAID

## 2021-03-24 MED ORDER — MECLIZINE HCL 25 MG PO TABS
ORAL_TABLET | 1 refills | Status: AC
Start: 2021-03-24 — End: ?

## 2021-03-24 NOTE — Progress Notes
PATIENT: Chelsea [name]  MRN: [id number]  DOB: 1960-07-25  DATE OF SERVICE: 03/24/2021    CHIEF COMPLAINT:   Chief Complaint   Patient presents with   ? Follow-up        HPI   Chelsea [name] is a 60 y.o. female who  has a past medical history of Abnormal cardiovascular stress test (06/21/2020), Diabetes mellitus (HCC/RAF) (02/20/2017), Hypertension, essential (06/21/2020), Hypothyroidism (06/21/2020), Myelopathy concurrent with and due to spinal stenosis of cervical region (HCC/RAF) (06/21/2020), and OSA (obstructive sleep apnea) (06/21/2020). who presents for   Chief Complaint   Patient presents with   ? Follow-up     Here for [employer] life-support equipment discount form.  She needs lower bill for her CPAP machine.    Always declines flu and PNA vaccines.    HTN  On olmesartan-hydrochlorothiazide 40/25 mg x1/2 tablet every day.  BP today 128/70  ?  Cervical myeloradiculopathy  Onset since forehead injury 08/25/19, with evidence of cervical spinal stenosis on imaging. ?Her symptoms included severe neck pain, imbalance, headaches, and tingling in the hands. ?Addressed thoroughly with Dr. Vickki Hearing. ?  S/p C7-T1 interlaminar ESI on 07/28/20 with initial relief but tapered over course of a month. ?Imaging demonstrates multilevel degeneration with cervical stenosis most severe at C5/6.??  Planning on surgical intervention with neurosurg Dr. Konrad Felix but undergoing dental work first for infection  On gabapentin 100mg  TID   ?  Hypercholesterolemia with elevated ASCVD risk  Abnormal MR brain  History of acute disorientation, imbalance, and concussions with MR brain demonstrating possible evolving subacute to chronic infarct within the posterior insular cortex. ?She was started on atorvastatin and ASA 81 for risk reduction, but she eventually self-discontinued the statin therapy.    Repeat MR brain 11/23/20 shows stable possible insular cortex stroke.  Followed by cards and neurology.  03/24/2021 She still refuses to restart statin.  She wants to make lifestyle modifications    #DM2  Diagnosed with HgA1c 9%.?  Initially treated with MFM then lost significant weight.  A1c improved to 6.2% and switched to diet control  Retinopathy negative and UTD July 2022  Declines pneumococcal vaccine.   Interested in semaglutide to help with weight    Not addressed this visit:?  #Anxiety  Very anxious esp in s/o her chronic neck pain, headaches, and confusion  History of 20y disability due to severe panic disorder?  Started on Hydroxyzine during ER visit. Reports that it helps.     #Hypothyroidism  S/p RAI in 30's.?On LT4..  Euthyroid 09/07/20  ?  ?#Fibroids  Planning?on exploring UFE with IR  S/p repeat EMB 11/11/20 with pathology negative for malignancy?    #Obesity  She reports interval weight loss of 9 pounds - today's weight is 243 lbs  In weight loss program  ?  #OSA  Remains on CPAP at bedtime  ?  #Recurrent severe headaches  Following with Neurology who suspected TTH vs migraine     Patient Care Team:  Clint Lipps, DO as PCP - General (Family Medicine)  Clint Lipps, DO as PCP - Plan Assigned (Family Medicine)    MEDS     Outpatient Medications Prior to Visit   Medication Sig   ? amoxicillin 500 mg capsule take 1 capsule by mouth three times a day until finished   ? ciclopirox 8% solution Apply topically at bedtime Apply to adjacent skin and affected nails daily. Remove with alcohol every 7 days. Use up to 48 weeks..   ? clindamycin 1% gel APPLY  TO AFFECTED AREA(S) TWICE DAILY - for ear.   ? clobetasol 0.05% ointment Apply topically two (2) times daily APPLY AND GENTLY MASSAGE INTO AFFECTED AREA(S) TWICE DAILY. Do not apply to face, armpit, or groin..   ? clobetasol 0.05% ointment Apply topically two (2) times daily APPLY AND GENTLY MASSAGE INTO AFFECTED AREA(S) TWICE DAILY. Do not apply to face, armpit, or groin..   ? clobetasol 0.05% ointment Apply topically two (2) times daily APPLY AND GENTLY MASSAGE INTO AFFECTED AREA(S) TWICE DAILY. Do not apply to face, armpit, or groin..   ? Crisaborole (EUCRISA) 2 % OINT Apply to affected areas twice daily.   ? cyclobenzaprine 10 mg tablet Take 1 tablet (10 mg total) by mouth every eight (8) hours as needed for Muscle spasms.   ? diclofenac Sodium 1% gel apply 2 to 3 gram twice a day if needed   ? diphenhydrAMINE 50 mg capsule Take 50 mg benadryl by mouth 1 hour prior to contrast administration.   ? gabapentin (NEURONTIN) 100 mg capsule Take 1 capsule (100 mg total) by mouth three (3) times daily.   ? gabapentin 100 mg capsule .   ? hydrocortisone 1% cream Apply topically.   ? hydrOXYzine 25 mg tablet Take 1 tablet (25 mg total) by mouth three (3) times daily as needed for Anxiety.   ? ibuprofen 800 mg tablet take 1 tablet by mouth every 6 to 8 hours if needed for pain   ? LEVOTHYROXINE 100 mcg tablet take 1 tablet by mouth every morning before breakfast   ? Magnesium Glycinate 100 MG CAPS Take 100 mg by mouth at bedtime.   ? MECLIZINE 25 mg tablet take 1 tablet by mouth every 6 hours if needed for nausea   ? olmesartan-hydroCHLOROthiazide 40-25 mg tablet Take 0.5 tablets by mouth daily.   ? RESTASIS 0.05 % ophthalmic emulsion instill 1 drop INTO AFFECTED EYE(S) every 12 hours     No facility-administered medications prior to visit.       PHYSICAL EXAM      There were no vitals filed for this visit.  Body mass index is 47.88 kg/m?Marland Kitchen  Wt Readings from Last 3 Encounters:   03/24/21 (!) 261 lb 12.8 oz (118.8 kg)   02/28/21 (!) 259 lb 12.8 oz (117.8 kg)   02/01/21 (!) 255 lb (115.7 kg)      Gen - Awake. No acute distress. Age appropriate appearance.   Eyes - EOMI. No conjunctival pallor or injection. No scleral icterus.  Neck - Supple. No thyroid enlargement, tenderness, or nodules. No cervical, supraclavicular or infraclavicular lymphadenopathy.   Pulm - Nml effort. Clear to auscultation bilaterally. No wheezes, rales, or rhonchi.  Cards - RRR. S1 S2. No murmurs, rubs, or gallops.   GI - Soft. Nondistended. Nontender to palpation in all quadrants. No rebound, guarding, or rigidity.   MSK - No cyanosis, clubbing, or LE edema.   Neuro - AAOx3. CN2-12 grossly intact.  Psych - Normal mood and affect.     LABS/STUDIES   I have:   []  Reviewed/ordered []  1 []  2 []  ? 3 unique laboratory, radiology, and/or diagnostic tests noted below    []  Reviewed []  1 []  2 []  ? 3 prior external notes and incorporated into patient assessment    Neuro Dr. Nancy Marus 02/01/21  []  Discussed management or test interpretation with external provider(s) as noted       Lab Studies:  CBC:   Results for orders placed or performed in visit  on 01/07/21   CBC   Result Value Ref Range    White Blood Cell Count 7.52 4.16 - 9.95 x10E3/uL    Red Blood Cell Count 5.04 3.96 - 5.09 x10E6/uL    Hemoglobin 14.7 11.6 - 15.2 g/dL    Hematocrit 19.1 (H) 34.9 - 45.2 %    Mean Corpuscular Volume 91.5 79.3 - 98.6 fL    Mean Corpuscular Hemoglobin 29.2 26.4 - 33.4 pg    MCH Concentration 31.9 31.5 - 35.5 g/dL    Red Cell Distribution Width-SD 42.5 36.9 - 48.3 fL    Red Cell Distribution Width-CV 12.7 11.1 - 15.5 %    Platelet Count, Auto 288 143 - 398 x10E3/uL    Mean Platelet Volume 10.1 9.3 - 13.0 fL    Nucleated RBC%, automated 0.0 No Ref. Range %    Absolute Nucleated RBC Count 0.00 0.00 - 0.00 x10E3/uL    Neutrophil Abs (Prelim) 5.09 See Absolute Neut Ct. x10E3/uL   Differential, Automated   Result Value Ref Range    Neutrophil Percent, Auto 67.7 No Ref. Range %    Lymphocyte Percent, Auto 23.5 No Ref. Range %    Monocyte Percent, Auto 6.1 No Ref. Range %    Eosinophil Percent, Auto 1.7 No Ref. Range %    Basophil Percent, Auto 0.7 No Ref. Range %    Immature Granulocytes% 0.3 No Reference Range %    Absolute Neut Count 5.09 1.80 - 6.90 x10E3/uL    Absolute Lymphocyte Count 1.77 1.30 - 3.40 x10E3/uL    Absolute Mono Count 0.46 0.20 - 0.80 x10E3/uL    Absolute Eos Count 0.13 0.00 - 0.50 x10E3/uL    Absolute Baso Count 0.05 0.00 - 0.10 x10E3/uL    Absolute Immature Gran Count 0.02 0.00 - 0.04 x10E3/uL   CBC & Auto Differential    Narrative    The following orders were created for panel order CBC & Auto Differential.  Procedure                               Abnormality         Status                     ---------                               -----------         ------                     YNW[[id number]]                          Abnormal            Final result               Differential, Automated[[id number]]                          Final result                 Please view results for these tests on the individual orders.       CMP:   Results for orders placed or performed in visit on 01/07/21   Comprehensive Metabolic Panel   Result Value Ref Range    Sodium  140 135 - 146 mmol/L    Potassium 3.7 3.6 - 5.3 mmol/L    Chloride 99 96 - 106 mmol/L    Total CO2 31 (H) 20 - 30 mmol/L    Anion Gap 10 8 - 19 mmol/L    Glucose 153 (H) 65 - 99 mg/dL    Creatinine 1.61 0.96 - 1.30 mg/dL    Estimated GFR >04 See GFR Additional Information mL/min/1.26m2    GFR Additional Information See Comment     Urea Nitrogen 11 7 - 22 mg/dL    Calcium 54.0 (H) 8.6 - 10.4 mg/dL    Total Protein 7.6 6.1 - 8.2 g/dL    Albumin 4.3 3.9 - 5.0 g/dL    Bilirubin,Total 0.3 0.1 - 1.2 mg/dL    Alkaline Phosphatase 88 37 - 113 U/L    Aspartate Aminotransferase 18 13 - 62 U/L    Alanine Aminotransferase 18 8 - 70 U/L     Hgb A1C:   Lab Results   Component Value Date/Time    HGBA1C 6.2 (H) 01/07/2021 12:54 PM     Lipids:   Results for orders placed or performed in visit on 01/07/21   Lipid Panel   Result Value Ref Range    Cholesterol 186 See Comment mg/dL    Cholesterol,LDL,Calc 111 (H) <100 mg/dL    Cholesterol, HDL 42 (L) >50 mg/dL    Triglycerides 981 (H) <150 mg/dL    Non-HDL,Chol,Calc 191 (H) <130 mg/dL      TSH:   Lab Results   Component Value Date/Time    TSH 1.6 09/07/2020 01:13 PM          A&P   Chelsea [name] is a 60 y.o. female presenting for   Chief Complaint   Patient presents with   ? Follow-up         PROBLEM & ORDERS    ICD-10-CM 1. OSA (obstructive sleep apnea)  G47.33    2. Hypertension, essential  I10    3. Abnormal finding on MRI of brain  R90.89    4. Candidate for statin therapy due to risk of future cardiovascular event  Z91.89      ASSESSMENT    Problem List Items Addressed This Visit        Nervous    ? Myelopathy concurrent with and due to spinal stenosis of cervical region (HCC/RAF)     Overview      06/21/2020 Radiculopathy and myelopathy symptoms since forehead injury 08/25/19. MRI cervical spine 09/30/19 showing multilevel degenerative changes and central disk/osteophyte protrusion impinging the spinal cord with severe spinal stenosis and severe BL foraminal stenosis. Originally recommended for surgical intervention, which was then cancelled due to abnormal preoperative stress test. Neurosurgery referral provided today. She has had a h/o difficult intubation in the past.   07/06/2020 Chronic, stable. Evaluated by neurosurg with plans to repeat MRI in 1 month and consider surgical intervention. Seen by PMR who prescribed medrol dose pack. C/w Gabapentin.   07/16/2020 Chronic, stable. MRI cervical spine with moderate to severe spinal canal stenosis and mild increased T2 signal suggestive of cord edema. Medrol dosepack and Meloxicam 7.5 mg every day + Gabapentin for pain control. Recommended urgent f/u with Neurosurg.   09/10/2020  Chronic, stable. S/p ESI by Dr. Vickki Hearing on 07/28/20 with significant improvement. Following with neurosurgery and pmr on gabapentin.  11/22/2020 Chronic, stable. Planning on surgical intervention with Dr. Konrad Felix on 01/05/21.   01/14/2021 Chronic, stable. C/w neck brace. Flexeril 10 mg  TID PRN muscle spasm. F/U with neurosurgery for planned surgical intervention on 01/05/21.   03/24/2021 Chronic, stable. Surgery rescheduled 2/2 dental infection. C/w neck brace, Flexeril 10 mg TID PRN muscle spasm, gabapentin.               Respiratory    ? OSA (obstructive sleep apnea)     Overview      Chronic, stable. Compliant with CPAP therapy.  Completed [employer] discount form for CPAP machine.              Circulatory    ? Hypertension, essential     Overview      Chronic, stable. BP at goal. C/w Benicar 40/25 mg x1/2 tablet every day. Suspect episodes of elevated home BP are related to anxiety.               Other    ? Candidate for statin therapy due to risk of future cardiovascular event     Overview      09/10/2020  Chronic, stable. Start Atorvastatin 40 mg every day given DM2 + age + abnormal MRI brain finding c/f possible evolving subacute to chronic infarct. Will start ASA 81 mg every day as well and recommended f/u with neurology to discuss.   09/22/2020 Chronic, stable. Patient is very hesitant to take cholesterol lowering medication despite counseling on indication with DM2 + age > 40 and possible subacute to chronic infarct on MRI brain. Patient agreeable to start Atorvastatin 10 mg every day, c/w ASA 81 mg every day, and f/u with neurology. Repeat MRI brain previously ordered.   11/22/2020 Chronic, stable. Poor compliance with statin therapy. C/w Atorvastatin 10 mg every day (recommended at least 40 mg every day but patient refuses). C/w ASA 81 mg every day for secondary prevention of possible lacunar stroke on MRI brain.   03/24/2021 Not at treatment goal.  Again recommended mod-high intensity statin given DM2 + Age > 40 and abnormal MRI w/ c/f possible lacunar stroke; patient refused in favor of lifestyle modification. C/w ASA 81 mg every day for secondary prevention. Following with neurology.            ? Abnormal finding on MRI of brain     Overview      MRI brain ordered for w/u of H/A showed possible evolving subacute to chronic infarct in the posterior insular cortex. No neurologic deficits on exam. C/w ASA 81 mg every day + Atorvastatin 10 mg every day (patient refused higher dose). Neurology referral provided.   11/22/2020 Seen by Neuro who recommended continuing with ASA and statin therapy for secondary prevention of possible lacunar stroke. Neuro has also ordered repeat MRI brain w/ and w/o contrast. Will premedicate with Methylpred 32 mg 12 hours pre-contrast, 2 hours pre-contrast, and 50 mg benadryl 1 hour pre contrast.   03/24/2021 Repeat imaging 11/23/20 shows stability of known FLAIR abnormality. Again recommended patient restart statin which she has refused. Continue ASA 81 for secondary prevention.               Diagnoses and all orders for this visit:    OSA (obstructive sleep apnea)    Hypertension, essential    Abnormal finding on MRI of brain    Candidate for statin therapy due to risk of future cardiovascular event    Myelopathy concurrent with and due to spinal stenosis of cervical region (HCC/RAF)    Other orders  -     meclizine 25 mg tablet; take 1 tablet  by mouth every 6 hours if needed for nausea.      No orders of the defined types were placed in this encounter.    The above recommendation were discussed with the patient.  The patient has all questions answered satisfactorily and is in agreement with this recommended plan of care.    Scribe Signature:  I, Quentin Ore, have scribed for Dr. Clint Lipps with the documentation for Chelsea [name] in Internal Medicine [facility] on 03/24/2021 at 11:34 AM.    Physician Signature:   I have reviewed this note, scribed by Quentin Ore and attest that it is an accurate representation of my H & P and other events of the outpatient visit except if otherwise noted.  Clint Lipps, DO 03/24/2021 11:34 AM   Clinical Instructor  Department of Medicine  03/24/2021 11:34 AM

## 2021-03-25 ENCOUNTER — Non-Acute Institutional Stay: Payer: MEDICAID

## 2021-03-25 DIAGNOSIS — I10 Essential (primary) hypertension: Secondary | ICD-10-CM

## 2021-03-26 MED ORDER — OLMESARTAN MEDOXOMIL-HCTZ 40-25 MG PO TABS
.5 | ORAL_TABLET | Freq: Every day | ORAL | 1 refills | Status: AC
Start: 2021-03-26 — End: ?

## 2021-03-26 MED ORDER — HYDROXYZINE HCL 25 MG PO TABS
ORAL_TABLET | 1 refills | Status: AC
Start: 2021-03-26 — End: ?

## 2021-04-12 ENCOUNTER — Non-Acute Institutional Stay: Payer: MEDICAID

## 2021-04-14 ENCOUNTER — Ambulatory Visit: Payer: MEDICAID

## 2021-04-14 ENCOUNTER — Non-Acute Institutional Stay: Payer: MEDICAID

## 2021-04-20 ENCOUNTER — Ambulatory Visit: Payer: MEDICAID

## 2021-04-26 ENCOUNTER — Ambulatory Visit: Payer: MEDICAID

## 2021-04-26 ENCOUNTER — Telehealth: Payer: MEDICAID

## 2021-04-27 NOTE — Telephone Encounter
Call Back Request      Reason for call back: pt has an appt on 07/05/20 for steroid injection, she's looking to do the epidural injection. She wants to know if you'll be able to do it that date or if it needs to be scheduled as a procedure. Please call her to confirm.     Any Symptoms:  []  Yes  [x]  No       If yes, what symptoms are you experiencing:    o Duration of symptoms (how long):    o Have you taken medication for symptoms (OTC or Rx):      If call was taken outside of clinic hours:    [] Patient or caller has been notified that this message was sent outside of normal clinic hours.     [] Patient or caller has been warm transferred to the physician's answering service. If applicable, patient or caller informed to please call us back if symptoms progress.  Patient or caller has been notified of the turnaround time of 1-2 business day(s).

## 2021-04-28 DIAGNOSIS — M5412 Radiculopathy, cervical region: Secondary | ICD-10-CM

## 2021-04-28 DIAGNOSIS — G959 Disease of spinal cord, unspecified: Secondary | ICD-10-CM

## 2021-05-03 ENCOUNTER — Inpatient Hospital Stay: Payer: BLUE CROSS/BLUE SHIELD

## 2021-05-03 ENCOUNTER — Ambulatory Visit: Payer: MEDICAID

## 2021-05-04 ENCOUNTER — Telehealth: Payer: MEDICAID

## 2021-05-04 NOTE — Telephone Encounter
Spoke with patient she would like to keep her appt for this week. Messaged Dr. Anola Gurney.

## 2021-05-04 NOTE — Telephone Encounter
Spoke with patient - Cp

## 2021-05-04 NOTE — Telephone Encounter
Spoke with patient arranged a slightly earlier time to have her procedure. - Cp

## 2021-05-04 NOTE — Addendum Note
Addended by: Charlynne Cousins on: 05/03/2021 08:57 PM     Modules accepted: Orders

## 2021-05-04 NOTE — Telephone Encounter
PDL Call to Clinic    Reason for Call: Patient called in requesting to speak with Chelsea [name] in hopes of changing her procedure time to an earlier time slot. Unable to reach PDL, patient stated she will reach out via the portal.     Appointment Related?  [x]  Yes  []  No     If yes;  Date:11/30   Time:      Call warm transferred to PDL: []  Yes  [x]  No    Call Received by Clinic Representative: No answer     If call not answered/not accepted, call received by Patient Services Representative:

## 2021-05-05 ENCOUNTER — Ambulatory Visit: Payer: MEDICAID

## 2021-05-05 DIAGNOSIS — M4712 Other spondylosis with myelopathy, cervical region: Secondary | ICD-10-CM

## 2021-05-05 DIAGNOSIS — M4722 Other spondylosis with radiculopathy, cervical region: Secondary | ICD-10-CM

## 2021-05-05 MED ORDER — DULOXETINE HCL 20 MG PO CPEP
20 mg | ORAL_CAPSULE | Freq: Every day | ORAL | 3 refills | Status: AC
Start: 2021-05-05 — End: ?

## 2021-05-05 NOTE — Progress Notes
[facility] Physiatry Follow up Note    Attending Physician: Rhetta Mura, DO    Chief Complaint:    Chief Complaint   Patient presents with   ? Neck Pain       Today's Date: 05/05/2021    Last Visit:  12/14/2020    Since that time: Patient returns today for follow up. She is still dealing with a tooth infection and had to delay her surgery.  She is currently on antibiotics.  She is taking gabapentin which helps but makes her too drowsy to increase this medication. No new weakness or numbness.   Cervical ESI 11/03/2020  Cervical IL-ESI  07/28/2020    HISTORICAL DATA     Ms. Eisinger is a 60 y.o. female, who is seen for consultation and evaluation at the request of Dr. Konrad Felix for pain in the neck with radiation into the bilateral upper extremity and consideration of injection.  Patient reports she hit forehead about 9 months ago.  She then got severe neck and bilateral radiating arm pain, weakness and numbness.  Numbness is constant in the hands.  She has difficulty with arm coordination.  She reports balance issues chronically and has need a cane.      She has tried naprosyn-- doesn't tolrate  Gabapentin 300mg  1300mg  daily- mild help      Previous work-up has included: MRI  Has tried the following treatments: medications  Associated symptoms - + bilateral upper extremity tingling, numbness, or weakness.  Denies bowel or bladder incontinence.  + gait instability.    Current Medications:  Current Outpatient Medications   Medication Sig   ? ciclopirox 8% solution Apply topically at bedtime Apply to adjacent skin and affected nails daily. Remove with alcohol every 7 days. Use up to 48 weeks..   ? clindamycin 1% gel APPLY TO AFFECTED AREA(S) TWICE DAILY - for ear.   ? clobetasol 0.05% ointment Apply topically two (2) times daily APPLY AND GENTLY MASSAGE INTO AFFECTED AREA(S) TWICE DAILY. Do not apply to face, armpit, or groin..   ? clobetasol 0.05% ointment Apply topically two (2) times daily APPLY AND GENTLY MASSAGE INTO AFFECTED AREA(S) TWICE DAILY. Do not apply to face, armpit, or groin..   ? clobetasol 0.05% ointment Apply topically two (2) times daily APPLY AND GENTLY MASSAGE INTO AFFECTED AREA(S) TWICE DAILY. Do not apply to face, armpit, or groin..   ? Crisaborole (EUCRISA) 2 % OINT Apply to affected areas twice daily.   ? cyclobenzaprine 10 mg tablet Take 1 tablet (10 mg total) by mouth every eight (8) hours as needed for Muscle spasms.   ? diclofenac Sodium 1% gel apply 2 to 3 gram twice a day if needed   ? diphenhydrAMINE 50 mg capsule Take 50 mg benadryl by mouth 1 hour prior to contrast administration.   ? gabapentin (NEURONTIN) 100 mg capsule Take 1 capsule (100 mg total) by mouth three (3) times daily.   ? gabapentin 100 mg capsule .   ? hydrocortisone 1% cream Apply topically.   ? HYDROXYZINE 25 mg tablet take 1 tablet (25 MG TOTAL) by mouth three times a day if needed for anxiety   ? ibuprofen 800 mg tablet take 1 tablet by mouth every 6 to 8 hours if needed for pain   ? LEVOTHYROXINE 100 mcg tablet take 1 tablet by mouth every morning before breakfast   ? Magnesium Glycinate 100 MG CAPS Take 100 mg by mouth at bedtime.   ? meclizine 25 mg  tablet take 1 tablet by mouth every 6 hours if needed for nausea.   ? olmesartan-hydroCHLOROthiazide 40-25 mg tablet Take 0.5 tablets by mouth daily.   ? RESTASIS 0.05 % ophthalmic emulsion instill 1 drop INTO AFFECTED EYE(S) every 12 hours     No current facility-administered medications for this visit.        Allergies:  Iodinated diagnostic agents    Past Medical History:   Past Medical History:   Diagnosis Date   ? Abnormal cardiovascular stress test 06/21/2020    06/21/2020 Preop exam showed abnormal stress test with no EKG e/o ischemia but e/o large anterior wall ischemia with recommendation for cardiac catheterization. Had second opinion with repeat exercise nuclear stress test showing no e/o ischemia and was recommended against cardiac catheterization. I recommended starting Atorvastatin 40 mg every day for her DM2, but patient wishes to repeat labs fir   ? Diabetes mellitus (HCC/RAF) 02/20/2017    06/21/2020 Chronic, stable. Last HgA1c 6.6% on 06/03/19. C/w MFM 500 mg BID. Repeat HgA1c. Recommended starting Atorvastatin 40 mg every day but patient wishes to repeat labs first. Will need to clarify last retinopathy screening at next visit.    ? Hypertension, essential 06/21/2020    06/21/2020 Chronic,s table. C/w Benicar 40/25 mg x1/2 tablet every day. Reassess at next in office appointment.    ? Hypothyroidism 06/21/2020   ? Myelopathy concurrent with and due to spinal stenosis of cervical region (HCC/RAF) 06/21/2020    06/21/2020 Radiculopathy and myelopathy symptoms since forehead injury 08/25/19. MRI cervical spine 09/30/19 showing multilevel degenerative changes and central disk/osteophyte protrusion impinging the spinal cord with severe spinal stenosis and severe BL foraminal stenosis. Originally recommended for surgical intervention, which was then cancelled due to abnormal preoperative stress test. Neurosurge   ? OSA (obstructive sleep apnea) 06/21/2020    06/21/2020 Chronic, stable. C/w CPAP at bedtime.        Past Surgical History:   Past Surgical History:   Procedure Laterality Date   ? CESAREAN SECTION      x2   ? CHOLECYSTECTOMY  1987   ? INCISIONAL HERNIA REPAIR  1987       SocHx:   Social History     Socioeconomic History   ? Marital status: Single   Tobacco Use   ? Smoking status: Former     Packs/day: 0.25     Years: 5.00     Pack years: 1.25     Types: Cigarettes   ? Smokeless tobacco: Never   Vaping Use   ? Vaping Use: Never used   Substance and Sexual Activity   ? Alcohol use: Not Currently   ? Drug use: Not Currently       HABITS:       Social History     Tobacco Use   Smoking Status Former   ? Packs/day: 0.25   ? Years: 5.00   ? Pack years: 1.25   ? Types: Cigarettes   Smokeless Tobacco Never        Social History     Substance and Sexual Activity   Alcohol Use Not Currently        Social History     Substance and Sexual Activity   Drug Use Not Currently       FamHx:    History reviewed. No pertinent family history.     Allergies:  Iodinated diagnostic agents        Relevant Labs:  Lab Results   Component Value  Date    HGBA1C 6.2 (H) 01/07/2021     Creatinine   Date Value Ref Range Status   01/07/2021 0.63 0.60 - 1.30 mg/dL Final     Lab Results   Component Value Date    WBC 7.52 01/07/2021    HGB 14.7 01/07/2021    HCT 46.1 (H) 01/07/2021    MCV 91.5 01/07/2021    PLT 288 01/07/2021       Physical Exam:      Vital Signs: BP 130/72  ~ Pulse 69   General:  The patient is well developed, well nourished, and in no acute distress, alert with appropriate mood and affect.  She is wearing soft collar neck brace  Eyes: EOMI  Respiratory: Normal work of breathing without apnea and no evidence of respiratory distress without use of accessory muscles.      Imaging and work-up:  Imaging was reviewed personally by me and demonstrate by reporting    Cervical MRI 09/30/2019  ''IMPRESSION -     No significant change.     1. ?Multilevel degenerative changes most pronounced at C5-C6 where there is trace   retrolisthesis with a central disk/osteophyte protrusion impinging the spinal cord with   severe spinal canal stenosis. There is mild facet arthropathy and bilateral   uncovertebral hypertrophy causing severe bilateral foraminal stenosis. This affects the exiting   bilateral C6 nerve roots. This level is unchanged.     2. Reversal of the cervical lordosis suggesting muscle spasm and/or degenerative   changes.    ''  Xray cervical spine  ''IMPRESSION:   Impression:     Degenerative spondylosis most pronounced at C5-6.     No evidence for dynamic instability   ''  Commentary and Medical Decision Making:    Harmony Sandell is a 60 y.o. female who presents to clinic today for evaluation for cervical radicular pain.     Assessment:  1. Cervical myeloradiculoapthy    Plan:    Educated patient regarding complexity of neck pain and multidisciplinary approach to managing symptoms.    Diagnostic Imaging:   - Reviewed image report    Medications:   Continue gabapentin and tylenol  Rx for cymbalta 20g in the morning for neuropathic pain.  Risks, alternatives discussed.  She would like to try this.     Physical therapy:  No cervical manipulation   Continue HEP    Procedures:   -Discussed cervical ESI however, she is currently being treated for oral infection.  She would need the infection to be cleared and off antibiotics/ symptoms free for 2 weeks prior to epidural injection.       Follow-up:  Primary care provider for further management or medication refills and therapy renewals.     Spine Center:with spine surgery and  in 4 weeks    Advised the patient that if neurological issues were to develop such as weakness, bowel or bladder control, or worsening pain, that they should present immediately to the closest Emergency Room.    Thank you for this consultation; If I can be of further service, please contact my office.    Medical Decision Making: Moderate Complexity  1) Number and Complexity of Problems Addressed at Encounter:  - 1 chronic condition with exacerbation     2) Data Reviewed and Analyzed today:  - Tests/Documents (3)  - Reviewed prior external notes from referring provider, PCP, or surgeon  - Reviewed prior Xrays  - Reviewed Prior MRI  - Reviewed CBC  -  Reviewed CMP  - Reviewed HgA1C  3) Discussed minor procedure and procedure risks  Rx med management today    Rhetta Mura, DO  Interventional Spine  [facility]  Dept. Of Orthopedic Surgery

## 2021-05-05 NOTE — Patient Instructions
PLEASE REVIEW INFORMATION BELOW:     For all visits, please be ready to show your photo ID and insurance card(s), and pay any applicable co-pays that will be due at the time of visit.     It is your responsibility to be sure that your health insurance plan covers any lab test, radiology procedure, consultation, orthopaedic procedures which include, but not limit, injections, casting, bracing, durable medical equipment, etc. which has been recommended for you.  Contacting the Office:  Each physician office has a Patient Care Coordinator that will help coordinate your care. They can help with:  General questions  Relay specific questions to the doctor   Schedule surgery  Follow up on authorizations that are pending  Provide paperwork such as a return to work/school letter or disability forms  Please allow 24 hours for messages to be returned from the physician?s office.  There is a 7-10 business day turnaround for all forms.  We encourage you to sign up for [facility] to communicate directly with the office or physician online. Please ask any staff member for additional information.   Eber Jones: [phone]    Following Up with the Physician:  Before leaving, physician will let you know if you will need to follow-up within a certain time. If yes, make sure you schedule your follow-up appointment at the front desk or by calling the centralized scheduling center at:   [phone] Orthopaedic Surgery.   [phone] [facility].   [phone] Orthopaedic[facility].  Did your physician order any labs, imaging, or tests that should be completed before your next follow-up? If yes, please continue reading below.  Physician Order/Pre-authorization:  For any physician orders/pre-authorizations, please allow 5-7 business days for authorization to be obtained.  Imaging (MRI, X-Ray, CT Scan, etc)  If you are scheduling at a [facility], please call Radiology at [phone] to schedule your imaging. You do not need to wait for an authorization to schedule.   If you are scheduling outside of [place]:  Notify your physician?s office of your location choice, this will help in obtaining authorization for the correct facility.  Once you complete the imaging, obtain a copy of the report and images. Provide a copy to your physician for review.  Schedule a follow-up appointment to review the images, unless stated otherwise by your physician or the office.  Injections  If the physician recommends an injection, plan anywhere from 1-5 follow-up visits with your physician in a specific timeframe to complete the treatment plan. The follow-up visits can be as frequent as once a week until all the dosage has been administered.  If authorization is required, allow 5-7 business days for the office to obtain authorization.  Lab work  If labs are ordered, keep in mind:  Fasting or non-fasting?  How soon must they be done? (as soon as possible, one week before next appointment, a month before next appointment, etc.)  Do you need to schedule a follow-up visit to discuss results or did the physician say they will call you with the results?   If lab work is completed at a [facility], your results will automatically be sent to the physician, however allow a few days for results to be completed. Some labs require more time than others for results to finalize.  If lab work is completed outside a [facility], bring a copy of the lab orders with you. After a few days, follow-up with the lab to obtain a copy of  your lab results for your physician.  Physical Therapy  If physical therapy is recommended, notify the office of where you would prefer to seek therapy.  Before doing so, verify with the physical therapy location that your insurance will be accepted.  If authorization is required, allow 5-7 business days for the office to obtain authorization.  If physical therapy is completed at a [facility] facility, the progress reports will automatically be sent to the physician.  Physical therapy requires multiple visits to your chosen facility, anywhere from 2 weeks and more depending on the nature of your case.  Nerve Conduction/EMG Study  If the physician ordered a Nerve Conduction or EMG Study, contact the Refer to Physician Office to schedule your appointment.      If you develop any severe worsening pain, new weakness or numbness please contact the office or present to the Emergency Department.  If you experience new loss of bowel or bladder you should go to the Emergency Department.

## 2021-05-09 DIAGNOSIS — Z711 Person with feared health complaint in whom no diagnosis is made: Secondary | ICD-10-CM

## 2021-05-09 NOTE — Addendum Note
Addended by: Sable Feil on: 05/09/2021 12:10 PM     Modules accepted: Orders

## 2021-05-15 DIAGNOSIS — M5412 Radiculopathy, cervical region: Secondary | ICD-10-CM

## 2021-05-16 NOTE — Discharge Instructions
POST PROCEDURE INSTRUCTIONS      Follow up with Dr. Solberg's Office in two weeks to follow-up on the success of the injection that was done today.      The effects of the medications may take 5-10 days to take full effect; you may be sore until then.    A medication was given at the time of the block that may make you numb.  When the numbness wears off, the pain may return.  This is normal.    If pain at injection site, please apply ice to area for 20 minutes up to 5 times a day.    Gradually increase activity as tolerated.    For headaches, drink fluids and take caffeine. Headaches can make you feel terrible, but are usually not dangerous.  If headache continues for more than 48 hours please contact Dr. Solbergs office    If fever greater than 100.5 degrees F or wound infection call Dr. Solberg's Office.    For the next two days avoid heavy lifting, but continue to perform your stretching and exercise program as outlined by your Physical Therapist.  If you do not have a stretching program at this time, you will be instructed by your Physical Therapist at a scheduled appointment.    If a repeat injection is indicated, we will discuss if this at follow up visit in 2 weeks.      For any complications or issues after hours please contact the page operator and ask for the resident on call for Dr. Solberg: [phone]    If any problems or to reschedule procedures please call Dr. Solberg's office:   For Carolyn [phone]     For follow up appointments: [phone]

## 2021-05-19 ENCOUNTER — Ambulatory Visit: Payer: MEDICAID

## 2021-05-25 ENCOUNTER — Ambulatory Visit: Payer: MEDICAID

## 2021-05-25 ENCOUNTER — Telehealth: Payer: MEDICAID

## 2021-05-26 ENCOUNTER — Ambulatory Visit: Payer: MEDICAID

## 2021-05-26 NOTE — Telephone Encounter
Call Back Request      Reason for call back: Patient is requesting to talk with Chrys Racer to schedule an injection. Please advise patient, thank you.    CB: [phone]     Any Symptoms:  []  Yes  [x]  No       If yes, what symptoms are you experiencing:    o Duration of symptoms (how long):    o Have you taken medication for symptoms (OTC or Rx):      If call was taken outside of clinic hours:    [] Patient or caller has been notified that this message was sent outside of normal clinic hours.     [] Patient or caller has been warm transferred to the physician's answering service. If applicable, patient or caller informed to please call us back if symptoms progress.  Patient or caller has been notified of the turnaround time of 1-2 business day(s).

## 2021-05-27 ENCOUNTER — Ambulatory Visit: Payer: MEDICAID

## 2021-05-27 NOTE — Telephone Encounter
Reply by: Stacie Acres  Spoke with patient, scheduled 12/14.

## 2021-05-30 ENCOUNTER — Institutional Professional Consult (permissible substitution): Payer: MEDICAID

## 2021-05-30 DIAGNOSIS — G959 Disease of spinal cord, unspecified: Secondary | ICD-10-CM

## 2021-05-30 DIAGNOSIS — M5412 Radiculopathy, cervical region: Secondary | ICD-10-CM

## 2021-05-31 ENCOUNTER — Ambulatory Visit: Payer: MEDICAID

## 2021-05-31 ENCOUNTER — Telehealth: Payer: MEDICAID

## 2021-05-31 ENCOUNTER — Non-Acute Institutional Stay: Payer: MEDICAID

## 2021-05-31 LAB — COVID-19 PCR/TMA: COVID-19 PCR/TMA: NOT DETECTED

## 2021-05-31 NOTE — Telephone Encounter
Spoke with patient - that appt was for her folllow up after her procedure. Patient understood and will keep her appt for 06/28/20. Cp

## 2021-05-31 NOTE — Telephone Encounter
PDL Call to Clinic    Reason for Call: Patient called to inquire why her video visit today 12/13 at 12:45pm was cancelled. Patient states MD asked her to make an appt prior to her injection tomorrow 12/14. PDL was called but no answer. Please call patient back.    Appointment Related?  [x]  Yes  []  No     If yes;  Date: 12/13  Time:12:45pm    Call warm transferred to PDL: []  Yes  [x]  No    Call Received by Clinic Representative: no answer    If call not answered/not accepted, call received by Patient Services Representative:

## 2021-06-01 ENCOUNTER — Non-Acute Institutional Stay: Payer: MEDICAID

## 2021-06-01 ENCOUNTER — Ambulatory Visit: Payer: MEDICAID

## 2021-06-01 LAB — Glucose,POC: GLUCOSE,POC: 131 mg/dL — ABNORMAL HIGH (ref 65–99)

## 2021-06-01 MED ADMIN — DEXAMETHASONE SOD PHOSPHATE PF 10 MG/ML IJ SOLN: Stop: 2021-06-02

## 2021-06-01 MED ADMIN — GADOBUTROL 1 MMOL/ML IV SOLN: Stop: 2021-06-02 | NDC 50419032511

## 2021-06-01 MED ADMIN — DEXAMETHASONE SOD PHOSPHATE PF 10 MG/ML IJ SOLN: Stop: 2021-06-02 | NDC 70069002125

## 2021-06-01 MED ADMIN — LIDOCAINE HCL (PF) 1 % IJ SOLN: Stop: 2021-06-02 | NDC 63323049257

## 2021-06-01 MED ADMIN — BETAMETHASONE SOD PHOS & ACET 6 (3-3) MG/ML IJ SUSP: Stop: 2021-06-02 | NDC 51754506001

## 2021-06-02 NOTE — Op Note
PREOPERATIVE DIAGNOSIS:  cervical radiculopathy    POSTOPERATIVE DIAGNOSIS:  cervical radiculopathy    Physician: Rhetta Mura, DO    PROCEDURE:  1) C7-T1 cervical interlaminar epidural steroid injection  2) fluoroscopic guidance.    INDICATIONS:  Neck pain/arm pain    TYPE OF ANESTHESIA:  Local 1% lidocaine    CONSENT FOR THE PROCEDURES:  A signed informed consent was obtained, and the risks, benefits, and alternatives were discussed with the patient.  They include but are not limited to bleeding, infection, pain at the site of injection, minimal effectiveness of the procedures, nerve damage, weakness, and numbness in the extremities, and worsening spine pain.  Risks of chronic steroid exposure explained clearly to patient, including, but not limited to: osteoporosis, adrenal insufficiency, increased blood pressure, increased blood sugars, avascular necrosis, and weight gain.  Patient expresses understanding of these risks and consents to procedure. The patient agreed to proceed with the procedures.    DESCRIPTION OF THE PROCEDURES IN DETAIL:  The patient was brought into the procedure room and placed in the prone position.  The skin was prepped and draped in a normal sterile fashion using chlorhexidine.  The skin between the seventh cervical and first thoracic interspace was identified using AP fluoroscopic guidance.  The skin overlying the region was anesthetized with a total of 3 mL of 1% lidocaine using a 25-gauge, half-inch needle.    Once the skin was anesthetized, a 20-gauge 3.5 inch Tuohy needle was then advanced using intermittent fluoroscopic guidance in the AP and contralateral oblique views and the loss of resistance technique in a midline approach.  Once a loss of resistance was achieved, and after negative aspiration for heme and cerebrospinal fluid, 1 mL of gadavist contrast was then injected under live fluoroscopy, which showed good epidural spread without intrathecal or intravascular uptake with final needle positioning.  Then a solution containing 2cc of betamethasone 5mg /mL and 1cc of preservative free normal saline was injected.  The needle was withdrawn using 0.5 mL of sterile 1% lidocaine flush as to avoid a steroid track.  The site then was dressed and a bandage was applied.  The patient tolerated the procedure well without complications and was discharged in stable condition.  Post-procedure instructions were given.    ESTIMATED BLOOD LOSS:  Minimal.    COMPLICATIONS:  None    Rhetta Mura

## 2021-06-13 MED ORDER — RESTASIS 0.05 % OP EMUL
OPHTHALMIC | 3.00 refills | 30.00000 days
Start: 2021-06-13 — End: ?

## 2021-06-14 MED ORDER — RESTASIS 0.05 % OP EMUL
OPHTHALMIC | 3.00 refills | 30.00000 days
Start: 2021-06-14 — End: ?

## 2021-06-14 MED ORDER — CLOBETASOL PROPIONATE 0.05 % EX OINT
Freq: Two times a day (BID) | TOPICAL | 1 refills
Start: 2021-06-14 — End: ?

## 2021-06-14 MED ORDER — GABAPENTIN 100 MG PO CAPS
100 mg | ORAL_CAPSULE | Freq: Three times a day (TID) | ORAL | 3 refills
Start: 2021-06-14 — End: ?

## 2021-06-14 MED ORDER — CLOBETASOL PROPIONATE 0.05 % EX OINT
Freq: Two times a day (BID) | TOPICAL | 4 refills
Start: 2021-06-14 — End: ?

## 2021-06-14 MED ORDER — HYDROCORTISONE 1 % EX CREA
TOPICAL | 1.00 refills | 20.00000 days
Start: 2021-06-14 — End: ?

## 2021-06-14 MED ORDER — CICLOPIROX 8 % EX SOLN
Freq: Every evening | TOPICAL | 3 refills
Start: 2021-06-14 — End: ?

## 2021-06-15 MED ORDER — CLOBETASOL PROPIONATE 0.05 % EX OINT
Freq: Two times a day (BID) | TOPICAL | 1 refills | Status: AC
Start: 2021-06-15 — End: ?

## 2021-06-15 MED ORDER — GABAPENTIN 100 MG PO CAPS
100 mg | ORAL_CAPSULE | Freq: Three times a day (TID) | ORAL | 3 refills | Status: AC
Start: 2021-06-15 — End: ?

## 2021-06-16 MED ORDER — HYDROCORTISONE 1 % EX CREA
Freq: Every day | TOPICAL | 1 refills | 20.00000 days
Start: 2021-06-16 — End: ?

## 2021-06-16 MED ORDER — RESTASIS 0.05 % OP EMUL
2 refills
Start: 2021-06-16 — End: ?

## 2021-06-16 MED ORDER — CICLOPIROX 8 % EX SOLN
Freq: Every evening | TOPICAL | 3 refills
Start: 2021-06-16 — End: ?

## 2021-06-16 MED ORDER — RESTASIS 0.05 % OP EMUL
3 refills
Start: 2021-06-16 — End: ?

## 2021-06-20 MED ORDER — CICLOPIROX 8 % EX SOLN
Freq: Every evening | TOPICAL | 3 refills | Status: AC
Start: 2021-06-20 — End: ?

## 2021-06-20 MED ORDER — HYDROCORTISONE 1 % EX CREA
Freq: Every day | TOPICAL | 1 refills | 20.00000 days | Status: AC
Start: 2021-06-20 — End: ?

## 2021-06-20 MED ORDER — RESTASIS 0.05 % OP EMUL
3 refills
Start: 2021-06-20 — End: ?

## 2021-06-20 MED ORDER — RESTASIS 0.05 % OP EMUL
2 refills
Start: 2021-06-20 — End: ?

## 2021-06-21 ENCOUNTER — Inpatient Hospital Stay: Payer: MEDICAID

## 2021-06-22 ENCOUNTER — Ambulatory Visit: Payer: MEDICAID

## 2021-06-23 ENCOUNTER — Non-Acute Institutional Stay: Payer: MEDICAID

## 2021-06-23 DIAGNOSIS — E119 Type 2 diabetes mellitus without complications: Secondary | ICD-10-CM

## 2021-06-23 NOTE — Telephone Encounter
S/W pt schedule her for January 25,2023 at 8:15 for her physical exam. Mrs. Chelsea [name] would like blood work order ahead . I explain to her that labs are order during office visit. Mrs, Chelsea [name] stated that Dr. Minna Antis always order then ahead of time. Please advice

## 2021-06-23 NOTE — Telephone Encounter
lingering headaches and now recently Im seeing specs of white lights in my peripheral vision

## 2021-06-28 ENCOUNTER — Telehealth: Payer: MEDICAID

## 2021-06-28 DIAGNOSIS — M4712 Other spondylosis with myelopathy, cervical region: Secondary | ICD-10-CM

## 2021-06-28 DIAGNOSIS — M4722 Other spondylosis with radiculopathy, cervical region: Secondary | ICD-10-CM

## 2021-06-29 ENCOUNTER — Telehealth: Payer: MEDICAID

## 2021-06-29 NOTE — Progress Notes
Patient Consent to Telehealth   The patient agreed to participate in the video visit prior to joining the visit.          [facility] Physiatry Follow up Note    Attending Physician: Chelsea Mura, DO    Chief Complaint:    Chief Complaint   Patient presents with   ? Neck Pain       Today's Date: 06/28/2021    Last Visit:  05/05/2021    Since that time: Patient returns today for follow up.  She continues to have significant benefit with the epidfrual injection however still does get episi=odic significant pain.  She reports worsening of her balance and coordination.  She has spine surgery scheduled now that her tooth infection has been fixed.  She will be seeing Dr. Konrad [name] for preop in 2 weeks. She did not take the cymbalta.   Cervical ESI 06/01/2021  Cervical ESI 11/03/2020  Cervical IL-ESI  07/28/2020    HISTORICAL DATA     Chelsea [name] is a 60 y.o. female, who is seen for consultation and evaluation at the request of Dr. Konrad [name] for pain in the neck with radiation into the bilateral upper extremity and consideration of injection.  Patient reports she hit forehead about 9 months ago.  She then got severe neck and bilateral radiating arm pain, weakness and numbness.  Numbness is constant in the hands.  She has difficulty with arm coordination.  She reports balance issues chronically and has need a cane.      She has tried naprosyn-- doesn't tolrate  Gabapentin 300mg  1300mg  daily- mild help      Previous work-up has included: MRI  Has tried the following treatments: medications  Associated symptoms - + bilateral upper extremity tingling, numbness, or weakness.  Denies bowel or bladder incontinence.  + gait instability.    Current Medications:  Current Outpatient Medications   Medication Sig   ? acetaminophen 500 mg tablet Take 2 tablets (1,000 mg total) by mouth every six (6) hours as needed for Pain.   ? ciclopirox 8% solution Apply topically at bedtime Apply to adjacent skin and affected nails daily. Remove with alcohol every 7 days. Use up to 48 weeks..   ? clindamycin 1% gel APPLY TO AFFECTED AREA(S) TWICE DAILY - for ear.   ? clobetasol 0.05% ointment Apply topically two (2) times daily APPLY AND GENTLY MASSAGE INTO AFFECTED AREA(S) TWICE DAILY. Do not apply to face, armpit, or groin..   ? clobetasol 0.05% ointment Apply topically two (2) times daily APPLY AND GENTLY MASSAGE INTO AFFECTED AREA(S) TWICE DAILY. Do not apply to face, armpit, or groin..   ? clobetasol 0.05% ointment Apply topically two (2) times daily APPLY AND GENTLY MASSAGE INTO AFFECTED AREA(S) TWICE DAILY. Do not apply to face, armpit, or groin..   ? Crisaborole (EUCRISA) 2 % OINT Apply to affected areas twice daily.   ? cyclobenzaprine 10 mg tablet Take 1 tablet (10 mg total) by mouth every eight (8) hours as needed for Muscle spasms.   ? diclofenac Sodium 1% gel apply 2 to 3 gram twice a day if needed   ? diphenhydrAMINE 50 mg capsule Take 50 mg benadryl by mouth 1 hour prior to contrast administration.   ? DULoxetine 20 mg DR capsule Take 1 capsule (20 mg total) by mouth daily.   ? gabapentin (NEURONTIN) 100 mg capsule Take 1 capsule (100 mg total) by mouth three (3) times daily.   ? gabapentin 100 mg  capsule .   ? hydrocortisone 1% cream Apply topically daily.   ? HYDROXYZINE 25 mg tablet take 1 tablet (25 MG TOTAL) by mouth three times a day if needed for anxiety   ? ibuprofen 800 mg tablet take 1 tablet by mouth every 6 to 8 hours if needed for pain   ? LEVOTHYROXINE 100 mcg tablet take 1 tablet by mouth every morning before breakfast   ? Magnesium Glycinate 100 MG CAPS Take 100 mg by mouth at bedtime.   ? meclizine 25 mg tablet take 1 tablet by mouth every 6 hours if needed for nausea.   ? olmesartan-hydroCHLOROthiazide 40-25 mg tablet Take 0.5 tablets by mouth daily.   ? RESTASIS 0.05 % ophthalmic emulsion instill 1 drop INTO AFFECTED EYE(S) every 12 hours     No current facility-administered medications for this visit. Allergies:  Iodinated contrast media    Past Medical History:   Past Medical History:   Diagnosis Date   ? Abnormal cardiovascular stress test 06/21/2020    06/21/2020 Preop exam showed abnormal stress test with no EKG e/o ischemia but e/o large anterior wall ischemia with recommendation for cardiac catheterization. Had second opinion with repeat exercise nuclear stress test showing no e/o ischemia and was recommended against cardiac catheterization. I recommended starting Atorvastatin 40 mg every day for her DM2, but patient wishes to repeat labs fir   ? Diabetes mellitus (HCC/RAF) 02/20/2017    06/21/2020 Chronic, stable. Last HgA1c 6.6% on 06/03/19. C/w MFM 500 mg BID. Repeat HgA1c. Recommended starting Atorvastatin 40 mg every day but patient wishes to repeat labs first. Will need to clarify last retinopathy screening at next visit.    ? Hypertension, essential 06/21/2020    06/21/2020 Chronic,s table. C/w Benicar 40/25 mg x1/2 tablet every day. Reassess at next in office appointment.    ? Hypothyroidism 06/21/2020   ? Myelopathy concurrent with and due to spinal stenosis of cervical region (HCC/RAF) 06/21/2020    06/21/2020 Radiculopathy and myelopathy symptoms since forehead injury 08/25/19. MRI cervical spine 09/30/19 showing multilevel degenerative changes and central disk/osteophyte protrusion impinging the spinal cord with severe spinal stenosis and severe BL foraminal stenosis. Originally recommended for surgical intervention, which was then cancelled due to abnormal preoperative stress test. Neurosurge   ? OSA (obstructive sleep apnea) 06/21/2020    06/21/2020 Chronic, stable. C/w CPAP at bedtime.        Past Surgical History:   Past Surgical History:   Procedure Laterality Date   ? CESAREAN SECTION      x2   ? CHOLECYSTECTOMY  1987   ? INCISIONAL HERNIA REPAIR  1987       SocHx:   Social History     Socioeconomic History   ? Marital status: Single   Tobacco Use   ? Smoking status: Former     Packs/day: 0.25 Years: 5.00     Pack years: 1.25     Types: Cigarettes   ? Smokeless tobacco: Never   Vaping Use   ? Vaping Use: Never used   Substance and Sexual Activity   ? Alcohol use: Not Currently   ? Drug use: Not Currently       HABITS:       Social History     Tobacco Use   Smoking Status Former   ? Packs/day: 0.25   ? Years: 5.00   ? Pack years: 1.25   ? Types: Cigarettes   Smokeless Tobacco Never  Social History     Substance and Sexual Activity   Alcohol Use Not Currently        Social History     Substance and Sexual Activity   Drug Use Not Currently       FamHx:    No family history on file.     Allergies:  Iodinated contrast media        Relevant Labs:  Lab Results   Component Value Date    HGBA1C 6.2 (H) 01/07/2021     Creatinine   Date Value Ref Range Status   01/07/2021 0.63 0.60 - 1.30 mg/dL Final     Lab Results   Component Value Date    WBC 7.52 01/07/2021    HGB 14.7 01/07/2021    HCT 46.1 (H) 01/07/2021    MCV 91.5 01/07/2021    PLT 288 01/07/2021       Physical Exam: Historical Exam- Exam limited today, Video Visit        Vital Signs: There were no vitals taken for this visit.  General:  The patient is well developed, well nourished, and in no acute distress, alert with appropriate mood and affect.  She is wearing soft collar neck brace  Eyes: EOMI  Respiratory: Normal work of breathing without apnea and no evidence of respiratory distress without use of accessory muscles.      Imaging and work-up:  Imaging was reviewed personally by me and demonstrate by reporting    Cervical MRI 09/30/2019  ''IMPRESSION -     No significant change.     1. ?Multilevel degenerative changes most pronounced at C5-C6 where there is trace   retrolisthesis with a central disk/osteophyte protrusion impinging the spinal cord with   severe spinal canal stenosis. There is mild facet arthropathy and bilateral   uncovertebral hypertrophy causing severe bilateral foraminal stenosis. This affects the exiting   bilateral C6 nerve roots. This level is unchanged.     2. Reversal of the cervical lordosis suggesting muscle spasm and/or degenerative   changes.    ''  Xray cervical spine  ''IMPRESSION:   Impression:     Degenerative spondylosis most pronounced at C5-6.     No evidence for dynamic instability   ''  Commentary and Medical Decision Making:    Chelsea [name] is a 62 y.o. female who presents to clinic today for evaluation for cervical radicular pain.     Assessment:  1. Cervical myeloradiculoapthy    Plan:    Educated patient regarding complexity of neck pain and multidisciplinary approach to managing symptoms.    Diagnostic Imaging:   - Reviewed image report    Medications:   Continue gabapentin and tylenol  She will discuss cymbalta with her PCP     Physical therapy:  No cervical manipulation   Continue HEP    Procedures:   - none currently indicated    -She will follow up for spine surgery with Dr. Konrad [name]      Follow-up:  Primary care provider for further management or medication refills and therapy renewals.     Spine Center:with spine surgery and  with spine surgery- Dr. Konrad [name] as scheduled    Advised the patient that if neurological issues were to develop such as weakness, bowel or bladder control, or worsening pain, that they should present immediately to the closest Emergency Room.    Thank you for this consultation; If I can be of further service, please contact my office.    Medical  Decision Making: Moderate Complexity  1) Number and Complexity of Problems Addressed at Encounter:  - 1 chronic condition     2) Data Reviewed and Analyzed today:  - Tests/Documents (3)  - Reviewed prior external notes from referring provider, PCP, or surgeon  - Reviewed prior Xrays  - Reviewed Prior MRI  - Reviewed CBC  -Reviewed CMP  - Reviewed HgA1C  3) Discussed minor procedure and procedure risks  discussed med management today    Chelsea Mura, DO  Interventional Spine  [name]  Dept. Of Orthopedic Surgery

## 2021-06-29 NOTE — Telephone Encounter
Call Back Request      Reason for call back: Harjit from [facility] [place] is requesting diagnosis for this pt.   Phone # [phone]  Fax # [phone]    Any Symptoms:  []  Yes  [x]  No       If yes, what symptoms are you experiencing:    o Duration of symptoms (how long):    o Have you taken medication for symptoms (OTC or Rx):      If call was taken outside of clinic hours:    [] Patient or caller has been notified that this message was sent outside of normal clinic hours.     [] Patient or caller has been warm transferred to the physician's answering service. If applicable, patient or caller informed to please call us back if symptoms progress.  Patient or caller has been notified of the turnaround time of 1-2 business day(s).

## 2021-06-29 NOTE — Patient Instructions
PLEASE REVIEW INFORMATION BELOW:     For all visits, please be ready to show your photo ID and insurance card(s), and pay any applicable co-pays that will be due at the time of visit.     It is your responsibility to be sure that your health insurance plan covers any lab test, radiology procedure, consultation, orthopaedic procedures which include, but not limit, injections, casting, bracing, durable medical equipment, etc. which has been recommended for you.  Contacting the Office:  Each physician office has a Patient Care Coordinator that will help coordinate your care. They can help with:  General questions  Relay specific questions to the doctor   Schedule surgery  Follow up on authorizations that are pending  Provide paperwork such as a return to work/school letter or disability forms  Please allow 24 hours for messages to be returned from the physician?s office.  There is a 7-10 business day turnaround for all forms.  We encourage you to sign up for [facility] to communicate directly with the office or physician online. Please ask any staff member for additional information.   Eber Jones: [phone]    Following Up with the Physician:  Before leaving, physician will let you know if you will need to follow-up within a certain time. If yes, make sure you schedule your follow-up appointment at the front desk or by calling the centralized scheduling center at:   [phone] Orthopaedic Surgery.   [phone] [facility].   [phone] [facility].  Did your physician order any labs, imaging, or tests that should be completed before your next follow-up? If yes, please continue reading below.  Physician Order/Pre-authorization:  For any physician orders/pre-authorizations, please allow 5-7 business days for authorization to be obtained.  Imaging (MRI, X-Ray, CT Scan, etc)  If you are scheduling at a [facility], please call Radiology at [phone] to schedule your imaging. You do not need to wait for an authorization to schedule.   If you are scheduling outside of [facility]:  Notify your physician?s office of your location choice, this will help in obtaining authorization for the correct facility.  Once you complete the imaging, obtain a copy of the report and images. Provide a copy to your physician for review.  Schedule a follow-up appointment to review the images, unless stated otherwise by your physician or the office.  Injections  If the physician recommends an injection, plan anywhere from 1-5 follow-up visits with your physician in a specific timeframe to complete the treatment plan. The follow-up visits can be as frequent as once a week until all the dosage has been administered.  If authorization is required, allow 5-7 business days for the office to obtain authorization.  Lab work  If labs are ordered, keep in mind:  Fasting or non-fasting?  How soon must they be done? (as soon as possible, one week before next appointment, a month before next appointment, etc.)  Do you need to schedule a follow-up visit to discuss results or did the physician say they will call you with the results?   If lab work is completed at a [facility], your results will automatically be sent to the physician, however allow a few days for results to be completed. Some labs require more time than others for results to finalize.  If lab work is completed outside a [facility], bring a copy of the lab orders with you. After a few days, follow-up with the lab to obtain a copy of  your lab results for your physician.  Physical Therapy  If physical therapy is recommended, notify the office of where you would prefer to seek therapy.  Before doing so, verify with the physical therapy location that your insurance will be accepted.  If authorization is required, allow 5-7 business days for the office to obtain authorization.  If physical therapy is completed at a [facility] facility, the progress reports will automatically be sent to the physician.  Physical therapy requires multiple visits to your chosen facility, anywhere from 2 weeks and more depending on the nature of your case.  Nerve Conduction/EMG Study  If the physician ordered a Nerve Conduction or EMG Study, contact the Refer to Physician Office to schedule your appointment.      If you develop any severe worsening pain, new weakness or numbness please contact the office or present to the Emergency Department.  If you experience new loss of bowel or bladder you should go to the Emergency Department.

## 2021-06-29 NOTE — Telephone Encounter
I left a message for Harjit with [facility] [place]. To call our office so that I can get more information.

## 2021-06-30 ENCOUNTER — Non-Acute Institutional Stay: Payer: MEDICAID

## 2021-07-01 ENCOUNTER — Ambulatory Visit: Payer: MEDICAID

## 2021-07-01 ENCOUNTER — Telehealth: Payer: MEDICAID

## 2021-07-01 DIAGNOSIS — E890 Postprocedural hypothyroidism: Secondary | ICD-10-CM

## 2021-07-01 DIAGNOSIS — E119 Type 2 diabetes mellitus without complications: Secondary | ICD-10-CM

## 2021-07-01 NOTE — Progress Notes
PATIENT: Chelsea [name]  MRN: [id number]  DOB: 11/09/1960  DATE OF SERVICE: 07/01/2021    CHIEF COMPLAINT:   Chief Complaint   Patient presents with   ? Blood Pressure Check      No data recorded    HPI   Chelsea [name] is a 61 y.o. female who  has a past medical history of Abnormal cardiovascular stress test (06/21/2020), Diabetes mellitus (HCC/RAF) (02/20/2017), Hypertension, essential (06/21/2020), Hypothyroidism (06/21/2020), Myelopathy concurrent with and due to spinal stenosis of cervical region (HCC/RAF) (06/21/2020), and OSA (obstructive sleep apnea) (06/21/2020). who presents for   Chief Complaint   Patient presents with   ? Blood Pressure Check     Late for today's visit. Was also having insurance issues up front.    Always declines flu and PNA vaccines.    HTN  On olmesartan-hydrochlorothiazide 40/25 mg half tablet every day. BP today 147/76.  Very stressed in s/o upcoming neurosurgery for her neck in March 2023.    Cervical myeloradiculopathy  Chronic cervicogenic headaches  - Onset since forehead injury 08/25/19, with evidence of cervical spinal stenosis on imaging.?Her symptoms included severe neck pain, imbalance, headaches, and tingling in the hands. Addressed thoroughly with Dr. Vickki Hearing. ?  - S/p C7-T1 interlaminar ESI on 07/28/20 with initial relief but tapered over course of a month. Residual headache since injection. Imaging demonstrates multilevel degeneration with cervical stenosis most severe at C5/6. ??  - Scheduled for cervical discectomy of C5-6 with neurosurg Dr. Konrad Felix on 08/31/21  - Remains on gabapentin 100 mg TID. Has not yet started Cymbalta yet    Anxiety  Very anxious esp in s/o her chronic neck pain, headaches, and confusion  History of 20y disability due to severe panic disorder?  On Hydroxyzine since ER visit, but unclear if using. Also was prescribed Cymbalta by Dr. Vickki Hearing but she has been hesitant to start.   She would also like to see a therapist.     Imbalanced  Dizziness  Having dizziness and LH episodes.     Hypercholesterolemia with elevated ASCVD risk  Abnormal MR brain  History of acute disorientation, imbalance, and concussions with MR brain demonstrating possible evolving subacute to chronic infarct within the posterior insular cortex. She was started on atorvastatin and ASA 81 for risk reduction, but she eventually self-discontinued the statin therapy  Repeat MR brain 11/23/20 shows stable possible insular cortex stroke.  Followed by cards and neurology.  03/24/2021 She still refuses to restart statin. She wants to make lifestyle modifications  07/01/2021 Again refuses statins.     #DM2  Diagnosed with HgA1c 9%.?  Initially treated with MFM then lost significant weight.  A1c improved to 6.2% and switched to diet control  Retinopathy negative and UTD July 2022  Declines pneumococcal vaccine.   Interested in semaglutide to help with weight    #OSA  Remains on CPAP at bedtime    #Hypothyroidism  S/p RAI in 30's.?On LT4..  Euthyroid 09/07/20  ?  #Fibroids  S/p repeat EMB 11/11/20 with pathology negative for malignancy?    #Obesity  Interval weight gain.    Patient Care Team:  Clint Lipps, DO as PCP - General (Family Medicine)    MEDS     Outpatient Medications Prior to Visit   Medication Sig   ? acetaminophen 500 mg tablet Take 2 tablets (1,000 mg total) by mouth every six (6) hours as needed for Pain.   ? ciclopirox 8% solution Apply topically at bedtime Apply to adjacent skin and  affected nails daily. Remove with alcohol every 7 days. Use up to 48 weeks..   ? clindamycin 1% gel APPLY TO AFFECTED AREA(S) TWICE DAILY - for ear.   ? clobetasol 0.05% ointment Apply topically two (2) times daily APPLY AND GENTLY MASSAGE INTO AFFECTED AREA(S) TWICE DAILY. Do not apply to face, armpit, or groin..   ? clobetasol 0.05% ointment Apply topically two (2) times daily APPLY AND GENTLY MASSAGE INTO AFFECTED AREA(S) TWICE DAILY. Do not apply to face, armpit, or groin..   ? clobetasol 0.05% ointment Apply topically two (2) times daily APPLY AND GENTLY MASSAGE INTO AFFECTED AREA(S) TWICE DAILY. Do not apply to face, armpit, or groin..   ? Crisaborole (EUCRISA) 2 % OINT Apply to affected areas twice daily.   ? cyclobenzaprine 10 mg tablet Take 1 tablet (10 mg total) by mouth every eight (8) hours as needed for Muscle spasms.   ? diclofenac Sodium 1% gel apply 2 to 3 gram twice a day if needed   ? diphenhydrAMINE 50 mg capsule Take 50 mg benadryl by mouth 1 hour prior to contrast administration.   ? DULoxetine 20 mg DR capsule Take 1 capsule (20 mg total) by mouth daily.   ? gabapentin (NEURONTIN) 100 mg capsule Take 1 capsule (100 mg total) by mouth three (3) times daily.   ? gabapentin 100 mg capsule .   ? hydrocortisone 1% cream Apply topically daily.   ? HYDROXYZINE 25 mg tablet take 1 tablet (25 MG TOTAL) by mouth three times a day if needed for anxiety   ? ibuprofen 800 mg tablet take 1 tablet by mouth every 6 to 8 hours if needed for pain   ? LEVOTHYROXINE 100 mcg tablet take 1 tablet by mouth every morning before breakfast   ? Magnesium Glycinate 100 MG CAPS Take 100 mg by mouth at bedtime.   ? meclizine 25 mg tablet take 1 tablet by mouth every 6 hours if needed for nausea.   ? olmesartan-hydroCHLOROthiazide 40-25 mg tablet Take 0.5 tablets by mouth daily.   ? RESTASIS 0.05 % ophthalmic emulsion instill 1 drop INTO AFFECTED EYE(S) every 12 hours     No facility-administered medications prior to visit.       PHYSICAL EXAM      Last Recorded Vital Signs:    07/01/21 1125   BP: 132/62   Pulse: 65   Temp:    SpO2:      Body mass index is 48.29 kg/m?Marland Kitchen  Wt Readings from Last 3 Encounters:   07/01/21 (!) 264 lb (119.7 kg)   03/24/21 (!) 261 lb 12.8 oz (118.8 kg)   02/28/21 (!) 259 lb 12.8 oz (117.8 kg)      Gen - Awake. No acute distress. Age appropriate appearance.   Eyes - EOMI. No conjunctival pallor or injection. No scleral icterus.  Neck - Supple. No thyroid enlargement, tenderness, or nodules. No cervical, supraclavicular or infraclavicular lymphadenopathy.   Pulm - Nml effort. Clear to auscultation bilaterally. No wheezes, rales, or rhonchi.  Cards - RRR. S1 S2. No murmurs, rubs, or gallops.   GI - Soft. Nondistended. Nontender to palpation in all quadrants. No rebound, guarding, or rigidity.   MSK - No cyanosis, clubbing, or LE edema.   Neuro - AAOx3. CN2-12 grossly intact.  Psych - Very anxious. Normal cognition and affect.    LABS/STUDIES   I have:   []  Reviewed/ordered []  1 []  2 []  ? 3 unique laboratory, radiology, and/or diagnostic tests noted  below    []  Reviewed []  1 []  2 []  ? 3 prior external notes and incorporated into patient assessment    []  Discussed management or test interpretation with external provider(s) as noted       Lab Studies:  CBC:   Results for orders placed or performed in visit on 01/07/21   CBC   Result Value Ref Range    White Blood Cell Count 7.52 4.16 - 9.95 x10E3/uL    Red Blood Cell Count 5.04 3.96 - 5.09 x10E6/uL    Hemoglobin 14.7 11.6 - 15.2 g/dL    Hematocrit 16.1 (H) 34.9 - 45.2 %    Mean Corpuscular Volume 91.5 79.3 - 98.6 fL    Mean Corpuscular Hemoglobin 29.2 26.4 - 33.4 pg    MCH Concentration 31.9 31.5 - 35.5 g/dL    Red Cell Distribution Width-SD 42.5 36.9 - 48.3 fL    Red Cell Distribution Width-CV 12.7 11.1 - 15.5 %    Platelet Count, Auto 288 143 - 398 x10E3/uL    Mean Platelet Volume 10.1 9.3 - 13.0 fL    Nucleated RBC%, automated 0.0 No Ref. Range %    Absolute Nucleated RBC Count 0.00 0.00 - 0.00 x10E3/uL    Neutrophil Abs (Prelim) 5.09 See Absolute Neut Ct. x10E3/uL   Differential, Automated   Result Value Ref Range    Neutrophil Percent, Auto 67.7 No Ref. Range %    Lymphocyte Percent, Auto 23.5 No Ref. Range %    Monocyte Percent, Auto 6.1 No Ref. Range %    Eosinophil Percent, Auto 1.7 No Ref. Range %    Basophil Percent, Auto 0.7 No Ref. Range %    Immature Granulocytes% 0.3 No Reference Range %    Absolute Neut Count 5.09 1.80 - 6.90 x10E3/uL    Absolute Lymphocyte Count 1.77 1.30 - 3.40 x10E3/uL    Absolute Mono Count 0.46 0.20 - 0.80 x10E3/uL    Absolute Eos Count 0.13 0.00 - 0.50 x10E3/uL    Absolute Baso Count 0.05 0.00 - 0.10 x10E3/uL    Absolute Immature Gran Count 0.02 0.00 - 0.04 x10E3/uL   CBC & Auto Differential    Narrative    The following orders were created for panel order CBC & Auto Differential.  Procedure                               Abnormality         Status                     ---------                               -----------         ------                     WRU[[id number]]                          Abnormal            Final result               Differential, Automated[[id number]]                          Final result  Please view results for these tests on the individual orders.       CMP:   Results for orders placed or performed in visit on 01/07/21   Comprehensive Metabolic Panel   Result Value Ref Range    Sodium 140 135 - 146 mmol/L    Potassium 3.7 3.6 - 5.3 mmol/L    Chloride 99 96 - 106 mmol/L    Total CO2 31 (H) 20 - 30 mmol/L    Anion Gap 10 8 - 19 mmol/L    Glucose 153 (H) 65 - 99 mg/dL    Creatinine 2.72 5.36 - 1.30 mg/dL    Estimated GFR >64 See GFR Additional Information mL/min/1.67m2    GFR Additional Information See Comment     Urea Nitrogen 11 7 - 22 mg/dL    Calcium 40.3 (H) 8.6 - 10.4 mg/dL    Total Protein 7.6 6.1 - 8.2 g/dL    Albumin 4.3 3.9 - 5.0 g/dL    Bilirubin,Total 0.3 0.1 - 1.2 mg/dL    Alkaline Phosphatase 88 37 - 113 U/L    Aspartate Aminotransferase 18 13 - 62 U/L    Alanine Aminotransferase 18 8 - 70 U/L     Hgb A1C:   Lab Results   Component Value Date/Time    HGBA1C 6.2 (H) 01/07/2021 12:54 PM     Lipids:   Results for orders placed or performed in visit on 01/07/21   Lipid Panel   Result Value Ref Range    Cholesterol 186 See Comment mg/dL    Cholesterol,LDL,Calc 111 (H) <100 mg/dL    Cholesterol, HDL 42 (L) >50 mg/dL    Triglycerides 474 (H) <150 mg/dL    Non-HDL,Chol,Calc 259 (H) <130 mg/dL      TSH:   Lab Results Component Value Date/Time    TSH 1.6 09/07/2020 01:13 PM      A&P   Chelsea [name] is a 61 y.o. female presenting for   Chief Complaint   Patient presents with   ? Blood Pressure Check         PROBLEM & ORDERS    ICD-10-CM    1. Type 2 diabetes mellitus without complication, without long-term current use of insulin (HCC/RAF)  E11.9       2. Myelopathy concurrent with and due to spinal stenosis of cervical region (HCC/RAF)  M48.02     G99.2       3. Hypertension, essential  I10       4. Postablative hypothyroidism  E89.0       5. Candidate for statin therapy due to risk of future cardiovascular event  Z91.89       6. GAD (generalized anxiety disorder)  F41.1 Referral to [facility]      7. Morbid (severe) obesity due to excess calories (HCC/RAF)  E66.01         ASSESSMENT    Problem List Items Addressed This Visit        HCC Codes    ? Diabetes mellitus (HCC/RAF)     Overview      06/21/2020 Chronic, stable. Last HgA1c 6.6% on 06/03/19. C/w MFM 500 mg BID. Repeat HgA1c. Recommended starting Atorvastatin 40 mg every day but patient wishes to repeat labs first. Will need to clarify last retinopathy screening at next visit.   07/06/2020 Chronic, stable. HgA1c 6.7% on 06/29/20 with negative UACR. Recommended resuming MFM XR 500 mg every day, declined at this time. Start Atorvastatin 20 mg every day with  plans to uptitrate. Recommended pneumococcal vaccine, which was declined at this time. Ophtho referral for retinopathy screening.   07/16/2020 Chronic, stable. Did not resume MFM XR 500 mg every day. Reiterated my recommendation to restart MFM 500 mg every day. Recommended restarted Atorvastatin 20 mg every day.   08/31/2020 Chronic, stable. Patient declines medication. C/w diet and exercise. Has appt for retinopathy screening at [facility]. Recommended PPSV23, which was declined. Recommended moderate to high intesnity statin, which was declined. Therefore will start Atorvastatin 10 mg every day. 11/22/2020 Chronic, stable. HgA1c 6.6% on 09/14/20. C/w diet and exercise. C/w Atorvastatin 10 mg every day (recommended at least 40 mg every day, which was refused).   01/14/2021 Chronic, stable. HgA1c 6.2% 01/07/21. C/w diet and exercise. Recommended daily adherence with Atorvastatin.   03/01/2021 Chronic, stable. Diet controlled. Rec pneumococcal vaccine - declined. Recommended Atorvastatin - declined. Retinopathy screening ordered.          ? Morbid (severe) obesity due to excess calories (HCC/RAF)     Overview      Encouraged DASH/ADA or Mediterranean diet with appropriate caloric portioning and focus on lean meats and vegetables while limiting salt intake to < 2000 mg/day with low fat/low sugar diet. Encouraged regular aerobic exercise for at least 15 mins/week for cardioprotective benefits and strength training at least 2 days per week. Discussed goal BMI < 30 for morbidity/mortality  benefits. Recommended bariatric surgery evaluation, which was declined at this time. Recommended resuming MFM for DM2, which was declined at this time. Consider GLP-1 agonist.   07/16/2020 Chronic, stable. Recommended resuming MFM 500 mg every day, which patient will consider. Clinical nutrition referral for dietician.   09/10/2020 Chronic, stable. Down 9 lbs since last visit!   07/01/2021 Interval weight gain. Needs to work on lifestyle modification.         ? Myelopathy concurrent with and due to spinal stenosis of cervical region (HCC/RAF)     Overview      06/21/2020 Radiculopathy and myelopathy symptoms since forehead injury 08/25/19. MRI cervical spine 09/30/19 showing multilevel degenerative changes and central disk/osteophyte protrusion impinging the spinal cord with severe spinal stenosis and severe BL foraminal stenosis. Originally recommended for surgical intervention, which was then cancelled due to abnormal preoperative stress test. Neurosurgery referral provided today. She has had a h/o difficult intubation in the past.   07/06/2020 Chronic, stable. Evaluated by neurosurg with plans to repeat MRI in 1 month and consider surgical intervention. Seen by PMR who prescribed medrol dose pack. C/w Gabapentin.   07/16/2020 Chronic, stable. MRI cervical spine with moderate to severe spinal canal stenosis and mild increased T2 signal suggestive of cord edema. Medrol dosepack and Meloxicam 7.5 mg every day + Gabapentin for pain control. Recommended urgent f/u with Neurosurg.   09/10/2020  Chronic, stable. S/p ESI by Dr. Vickki Hearing on 07/28/20 with significant improvement. Following with neurosurgery and pmr on gabapentin.  11/22/2020 Chronic, stable. Planning on surgical intervention with Dr. Konrad Felix on 01/05/21.   01/14/2021 Chronic, stable. C/w neck brace. Flexeril 10 mg TID PRN muscle spasm. F/U with neurosurgery for planned surgical intervention on 01/05/21.   03/24/2021 Chronic, stable. Surgery rescheduled 2/2 dental infection. C/w neck brace, Flexeril 10 mg TID PRN muscle spasm, gabapentin.   07/01/2021 Chronic, stable. Not yet at goal. Scheduled for C5-6 discectomy in March 2023 with Dr. Konrad Felix. Meanwhile c/w gabapentin. Should start Cymbalta as prescribed.            Other    ? Candidate for  statin therapy due to risk of future cardiovascular event     Overview      09/10/2020  Chronic, stable. Start Atorvastatin 40 mg every day given DM2 + age + abnormal MRI brain finding c/f possible evolving subacute to chronic infarct. Will start ASA 81 mg every day as well and recommended f/u with neurology to discuss.   09/22/2020 Chronic, stable. Patient is very hesitant to take cholesterol lowering medication despite counseling on indication with DM2 + age > 40 and possible subacute to chronic infarct on MRI brain. Patient agreeable to start Atorvastatin 10 mg every day, c/w ASA 81 mg every day, and f/u with neurology. Repeat MRI brain previously ordered.   11/22/2020 Chronic, stable. Poor compliance with statin therapy. C/w Atorvastatin 10 mg every day (recommended at least 40 mg every day but patient refuses). C/w ASA 81 mg every day for secondary prevention of possible lacunar stroke on MRI brain.   07/01/2021 Chronic, not at treatment goal. Again recommended mod-high intensity statin given DM2 + Age > 40 and abnormal MRI w/ c/f possible lacunar stroke, but again patient refused in favor of lifestyle modification. C/w ASA 81 mg every day for secondary prevention. Following with neurology.          ? GAD (generalized anxiety disorder)     Overview      Chronic, stable. Recommended SSRI, declined. C/w Hydroxyzine PRN anxiety. Also on Gabapentin for cervical radiculopathy.   07/01/2021 Chronic, worsening in s/o upcoming neurosurgery. C/w hydroxyzine PRN and gabapentin. Encouraged patient also to take Cymbalta prescribed by Dr. Vickki Hearing. Referred to [facility] for adjuvant talk therapy.         ? Hypertension, essential     Overview      Chronic, stable. BP at goal. C/w Benicar 40/25 mg x1/2 tablet every day. Suspect episodes of elevated home BP are related to anxiety.          ? Hypothyroidism     Overview      S/p RAI in 30's.  09/10/2020 Chronic, stable. Euthyroid on labs from 06/29/20. C/w current regimen of Levothyroxine 100 mcg every day.   07/08/2021 Chronic, stable. Euthyroid on labs from 09/07/20. C/w current regimen of Levothyroxine 100 mcg every day.           Diagnoses and all orders for this visit:    Type 2 diabetes mellitus without complication, without long-term current use of insulin (HCC/RAF)    Myelopathy concurrent with and due to spinal stenosis of cervical region (HCC/RAF)    Hypertension, essential    Postablative hypothyroidism    Candidate for statin therapy due to risk of future cardiovascular event    GAD (generalized anxiety disorder)  -     Referral to [facility]    Morbid (severe) obesity due to excess calories (HCC/RAF)      Orders Placed This Encounter   ? Referral to [facility]     The above recommendation were discussed with the patient.  The patient has all questions answered satisfactorily and is in agreement with this recommended plan of care.    Scribe Signature:  I, Quentin Ore, have scribed for Dr. Clint Lipps with the documentation for Chelsea [name] in Internal Medicine [facility] on 07/08/2021 at 5:14 PM.    Physician Signature:   I have reviewed this note, scribed by Quentin Ore and attest that it is an accurate representation of my H & P and other events of the outpatient visit except  if otherwise noted.  Clint Lipps, DO 07/08/2021 5:14 PM   Clinical Instructor  Department of Medicine  07/08/2021 5:14 PM

## 2021-07-01 NOTE — Telephone Encounter
Spoke w/ pt & she is emailing an auth to me from her insurance for her upcoming visit with Dr. Lenard Galloway (1/23).

## 2021-07-06 ENCOUNTER — Telehealth: Payer: MEDICAID

## 2021-07-06 NOTE — Telephone Encounter
I spoke with patient and informed her that she would have to contact her insurance to get clarity regarding the letter that our office received approving her to see Dr. Minna Antis until 12/16/21

## 2021-07-06 NOTE — Telephone Encounter
Pt requesting a Cb from [place] regarding [employer] and diagnosis   Please CB    Thank You

## 2021-07-06 NOTE — Telephone Encounter
Called pt to let her know that our [facility] cannot clear her because they can't obtain authorization from [facility] for visit.  I told her she can keep her appt but it would be at her own risk.

## 2021-07-11 ENCOUNTER — Non-Acute Institutional Stay: Payer: MEDICAID

## 2021-07-12 ENCOUNTER — Non-Acute Institutional Stay: Payer: MEDICAID

## 2021-07-13 ENCOUNTER — Non-Acute Institutional Stay: Payer: MEDICAID

## 2021-07-13 NOTE — Progress Notes
PATIENT: Chelsea [name]  MRN: [id number]  DOB: [birth year]  DATE OF SERVICE: 07/13/2021    CHIEF COMPLAINT: No chief complaint on file.       HPI   Chelsea [name] is a 61 y.o. female who  has a past medical history of Abnormal cardiovascular stress test (06/21/2020), Diabetes mellitus (HCC/RAF) (02/20/2017), Hypertension, essential (06/21/2020), Hypothyroidism (06/21/2020), Myelopathy concurrent with and due to spinal stenosis of cervical region (HCC/RAF) (06/21/2020), and OSA (obstructive sleep apnea) (06/21/2020). who presents for No chief complaint on file.      HTN  On?olmesartan-hydrochlorothiazide 40/25 mg half tablet every day. BP today 147/76.  Very stressed in s/o upcoming neurosurgery for her neck in March 2023.  ?  Cervical myeloradiculopathy  Chronic cervicogenic headaches  - Onset since forehead injury 08/25/19, with evidence of cervical spinal stenosis on imaging.?Her symptoms included severe neck pain, imbalance, headaches, and tingling in the hands. Addressed thoroughly with Dr. Vickki Hearing. ?  - S/p C7-T1 interlaminar ESI on 07/28/20 with initial relief but tapered over course of a month. Residual headache since injection. Imaging demonstrates multilevel degeneration with cervical stenosis most severe at C5/6. ??  - Scheduled for cervical discectomy of C5-6 with neurosurg Dr. Konrad Felix on 08/31/21  - Remains on gabapentin 100 mg TID. Has not yet started Cymbalta yet  ?  Anxiety  Very anxious esp in s/o her chronic neck pain, headaches, and confusion  History of 20y disability due to severe panic disorder?  On Hydroxyzine since ER visit, but unclear if using. Also was prescribed Cymbalta by Dr. Vickki Hearing but she has been hesitant to start.   She would also like to see a therapist.     Imbalanced  Dizziness  Having dizziness and LH episodes.   ?  Hypercholesterolemia with elevated ASCVD risk  Abnormal MR brain  History of acute disorientation, imbalance, and concussions with MR brain demonstrating possible evolving subacute to chronic infarct within the posterior insular cortex. She was started on atorvastatin and ASA 81 for risk reduction, but she eventually self-discontinued the statin therapy  Repeat MR brain 11/23/20 shows stable possible insular cortex stroke.  Followed by cards and neurology.  03/24/2021 She still refuses to restart statin. She wants to make lifestyle modifications  07/01/2021 Again refuses statins.   ?  #DM2  Diagnosed with HgA1c 9%.?  Initially treated with MFM then lost significant weight.  A1c improved to 6.2% and switched to diet control  Retinopathy negative and UTD July 2022  Declines pneumococcal vaccine.   Interested in semaglutide to help with weight  ?  #OSA  Remains on CPAP at bedtime  ?  #Hypothyroidism  S/p RAI in 30's.?On LT4..  Euthyroid 09/07/20  ?  #Fibroids  S/p repeat EMB 11/11/20 with pathology negative for malignancy?  ?  #Obesity  Interval weight gain.  ?      Patient Care Team:  Clint Lipps, DO as PCP - General (Family Medicine)    MEDS     Outpatient Medications Prior to Visit   Medication Sig   ? acetaminophen 500 mg tablet Take 2 tablets (1,000 mg total) by mouth every six (6) hours as needed for Pain.   ? ciclopirox 8% solution Apply topically at bedtime Apply to adjacent skin and affected nails daily. Remove with alcohol every 7 days. Use up to 48 weeks..   ? clindamycin 1% gel APPLY TO AFFECTED AREA(S) TWICE DAILY - for ear.   ? clobetasol 0.05% ointment Apply topically two (2) times daily APPLY AND GENTLY  MASSAGE INTO AFFECTED AREA(S) TWICE DAILY. Do not apply to face, armpit, or groin..   ? clobetasol 0.05% ointment Apply topically two (2) times daily APPLY AND GENTLY MASSAGE INTO AFFECTED AREA(S) TWICE DAILY. Do not apply to face, armpit, or groin..   ? clobetasol 0.05% ointment Apply topically two (2) times daily APPLY AND GENTLY MASSAGE INTO AFFECTED AREA(S) TWICE DAILY. Do not apply to face, armpit, or groin..   ? Crisaborole (EUCRISA) 2 % OINT Apply to affected areas twice daily.   ? cyclobenzaprine 10 mg tablet Take 1 tablet (10 mg total) by mouth every eight (8) hours as needed for Muscle spasms.   ? diclofenac Sodium 1% gel apply 2 to 3 gram twice a day if needed   ? diphenhydrAMINE 50 mg capsule Take 50 mg benadryl by mouth 1 hour prior to contrast administration.   ? DULoxetine 20 mg DR capsule Take 1 capsule (20 mg total) by mouth daily.   ? gabapentin (NEURONTIN) 100 mg capsule Take 1 capsule (100 mg total) by mouth three (3) times daily.   ? gabapentin 100 mg capsule .   ? hydrocortisone 1% cream Apply topically daily.   ? HYDROXYZINE 25 mg tablet take 1 tablet (25 MG TOTAL) by mouth three times a day if needed for anxiety   ? ibuprofen 800 mg tablet take 1 tablet by mouth every 6 to 8 hours if needed for pain   ? LEVOTHYROXINE 100 mcg tablet take 1 tablet by mouth every morning before breakfast   ? Magnesium Glycinate 100 MG CAPS Take 100 mg by mouth at bedtime.   ? meclizine 25 mg tablet take 1 tablet by mouth every 6 hours if needed for nausea.   ? olmesartan-hydroCHLOROthiazide 40-25 mg tablet Take 0.5 tablets by mouth daily.   ? RESTASIS 0.05 % ophthalmic emulsion instill 1 drop INTO AFFECTED EYE(S) every 12 hours     No facility-administered medications prior to visit.       PHYSICAL EXAM    There were no vitals filed for this visit.  There is no height or weight on file to calculate BMI.  Wt Readings from Last 3 Encounters:   07/01/21 (!) 264 lb (119.7 kg)   03/24/21 (!) 261 lb 12.8 oz (118.8 kg)   02/28/21 (!) 259 lb 12.8 oz (117.8 kg)          LABS/STUDIES   I have:   []  Reviewed/ordered []  1 []  2 []  ? 3 unique laboratory, radiology, and/or diagnostic tests noted below    []  Reviewed []  1 []  2 []  ? 3 prior external notes and incorporated into patient assessment    []  Discussed management or test interpretation with external provider(s) as noted       Lab Studies:  {Labs Reviewed YQMVH:84696}     Imaging Studies:   {Images Reviewed:33791}    A&P   Chelsea [name] is a 61 y.o. female presenting for No chief complaint on file.        PROBLEM & ORDERS  No diagnosis found.    ASSESSMENT      There are no diagnoses linked to this encounter.  No orders of the defined types were placed in this encounter.      @orersenc @    The above recommendation were discussed with the patient.  The patient has all questions answered satisfactorily and is in agreement with this recommended plan of care.        No follow-ups on file.  Clint Lipps, DO  Clinical Instructor  Department of Medicine  07/13/2021 8:14 AM

## 2021-07-22 ENCOUNTER — Telehealth: Payer: MEDICAID

## 2021-07-22 NOTE — Telephone Encounter
Message to Practice/Provider      Message: pt would like copy of referral for behavior health uploaded onto mychart.   Thank you     Return call is not being requested by the patient or caller.    Patient or caller has been notified of the turnaround time of 1-2 business day(s).

## 2021-07-23 NOTE — Telephone Encounter
I spoke with patient and informed her how to look up referrals on her mychart.

## 2021-07-25 ENCOUNTER — Ambulatory Visit: Payer: MEDICAID

## 2021-07-25 DIAGNOSIS — M4802 Spinal stenosis, cervical region: Secondary | ICD-10-CM

## 2021-07-25 NOTE — Progress Notes
[facility], [place]  Neurosurgery - Spine    Progress Note     DATE OF SERVICE  07/25/2021  ATTENDING Ted Mcalpine  Lenna Gilford MD      INTERIM HISTORY   Chelsea [name] is a 61 y.o.-year-old female, with a?history of Abnormal cardiovascular stress test (06/21/2020), Diabetes mellitus (HCC/RAF) (02/20/2017), Hypertension, essential (06/21/2020), Hypothyroidism (06/21/2020), Myelopathy concurrent with and due to spinal stenosis of cervical region (HCC/RAF) (06/21/2020), OSA (obstructive sleep apnea) (06/21/2020),?and 20 years disability due to severe panic disorder,?who?returns?to the clinic with neck pain, imbalance, and tingling in the hands.?The patient was last seen on 09/06/2020, at which time we recommended surgical decompression after clearance with PEPC, via ACDF at C4/5 and C5/ with potential corpectomy if unable to decompress the retrovertebro stenosis. She denied any new neurological symptoms.   ?  Today, she is scheduled to have C5-C6 ACDF with possible C5 Corpectomy on September 20th 2022. She has some infections in the mouth. She has to have her infection cleared for 3 months. She has seen Dr Bretta Bang of Infectious Disease for the control of this. Her pain is persistent in the interim. Her arms, legs, are affected. She has numbness in her legs, and has persistent hip pain. She reports that her steroid injections are currently wearing off. She denies any new neurological symptoms.    07/25/2021  Today the patient reports mostly stable symptoms. Her pain is improved by the injections and today she is having a good day. However, she is frequently in excruciating pain. She also states that she feels as if her grip strength is weakening. She had questions regarding her upcoming surgery which were all answered to her satisfaction. She is amenable for surgery. Her mouth infection has resolved.      PHYSICAL EXAM   VITALS  BP 116/64  ~ Pulse 68     STRENGTH & GAIT   Deltoid Bicep Tricep Wrist   Ext Wrist Flex IO Grip   R 5 4 4 4 4 4 4    L 5 4 4 4 4 4 4    Mostly pain limited       Hip Flex Knee Extension Knee   Flexion Dorsi  Flex Plantar  Flex EHL   R 4 5 5 5 5 5    L 4 5 5 5 5 5    Pain limited  Gait - walks with cane    DEEP TENDON REFLEXES   Brachio  Radialis   Bicep Tricep Patellar Achilles Hoffmann Supra  Patellar   R 2 2 2 2 2  negative negative   L 2 2 2 2 2  negative negative     SENSORY EXAM  Sensory exam is intact symmetric to light touch.        DIAGNOSTIC IMAGING   No new imaging to review       ASSESSMENT   Chelsea [name] is a 61 y.o.-year-old female who presents to clinic today due to questions regarding upcoming surgery and questions regarding pre-operative clearance. All her questions were answered to her satisfaction and she would like to proceed with the planned surgery of C5/C6 ACDF and possible C5 corpectomy. She will need new imaging before surgery and be cleared by PEPC and infectious diseases.      PLAN & FOLLOW UP     - PEPC clearance for surgery  - ID clearance for surgery   - Repeat MRI c spine, repeat Ct c spine, XR c spine ap lateral  - continue with planned surgical date  40 minutes were spent personally by me today on this encounter which may include today?s pre-visit review of the chart, time spent during the visit, and today?s time spent after the visit documenting and coordinating care. The patient was seen, examined, and plan formulated with Dr. Konrad Felix.    Sheel M. Sherryll Burger, MD      I had the pleasure of speaking with Chelsea [name]. We reviewed some preoperative considerations for her forthcoming ACDF. She has had some issues with intubation in the past so we will have her seen by [facility]. Her imaging is out of date so we will repeat this imaging. Lastly, we will get an Infectious Disease consult as she had a significant oral infection that precipitated in emergent oral surgery and cancellation of her previous surgery. Assuming all of these evaluation go off without a hitch, her surgery is on the books for some time in March.   ________________________  Doy Hutching. Konrad Felix, MD  Assistant Professor  Department of Neurosurgery

## 2021-07-26 ENCOUNTER — Telehealth: Payer: MEDICAID

## 2021-07-26 NOTE — Telephone Encounter
Call Back Request      Reason for call back: Sonal with [facility] is requesting a call back regarding referrals placed yesterday. Please advise, thank you.     Sonal ext. [phone]      Any Symptoms:  []  Yes  [x]  No       If yes, what symptoms are you experiencing:    o Duration of symptoms (how long):    o Have you taken medication for symptoms (OTC or Rx):      If call was taken outside of clinic hours:    [] Patient or caller has been notified that this message was sent outside of normal clinic hours.     [] Patient or caller has been warm transferred to the physician's answering service. If applicable, patient or caller informed to please call us back if symptoms progress.  Patient or caller has been notified of the turnaround time of 1-2 business day(s).

## 2021-07-26 NOTE — Telephone Encounter
PDL Call to Clinic    Reason for Call:   [facility] calling in needing to speak to a PSR in Dr. Lenard Galloway office.    Please assist, thank you.    Appointment Related?  [x]  Yes  []  No     If yes;  Date:  Time:    Call warm transferred to PDL: [x]  Yes  []  No    Call Received by Clinic Representative:  Call was transferred over to [facility]      If call not answered/not accepted, call received by Patient Services Representative:

## 2021-07-26 NOTE — Telephone Encounter
Call Back Request      Reason for call back: Patient stated she was referred to Anesthesiologist and for a pre op by Dr.Vivas. Patient is requesting contact information for appointment. Please advise, thank you.     Any Symptoms:  []  Yes  [x]  No       If yes, what symptoms are you experiencing:    o Duration of symptoms (how long):    o Have you taken medication for symptoms (OTC or Rx):      If call was taken outside of clinic hours:    [] Patient or caller has been notified that this message was sent outside of normal clinic hours.     [] Patient or caller has been warm transferred to the physician's answering service. If applicable, patient or caller informed to please call us back if symptoms progress.  Patient or caller has been notified of the turnaround time of 1-2 business day(s).

## 2021-07-28 ENCOUNTER — Institutional Professional Consult (permissible substitution): Payer: MEDICAID

## 2021-07-28 DIAGNOSIS — Z1211 Encounter for screening for malignant neoplasm of colon: Secondary | ICD-10-CM

## 2021-07-28 DIAGNOSIS — E119 Type 2 diabetes mellitus without complications: Secondary | ICD-10-CM

## 2021-07-28 MED ORDER — ESCITALOPRAM OXALATE 10 MG PO TABS
ORAL_TABLET | 0 refills | Status: AC
Start: 2021-07-28 — End: ?

## 2021-07-28 MED ORDER — ROSUVASTATIN CALCIUM 10 MG PO TABS
10 mg | ORAL_TABLET | Freq: Every evening | ORAL | 1 refills | Status: AC
Start: 2021-07-28 — End: ?

## 2021-07-28 NOTE — Progress Notes
History and Physical Pre-Op Consultation  PMD: Chelsea Lipps, DO  07/28/2021    HPI:  Chelsea [name] is a 61 y.o. female PMH DM2, Morbid Obesity, Abnormal finding on MRI brain, HLD, Fibroid, GAD, HTN, Hypothyroidism, OSA, Cervical stenosis with myelopathy who presents for   No chief complaint on file.      Surgeon: Dr. Lenna Gilford  Planned surgery: CERVICAL 5-6 ANTERIOR CERVICAL DISCECTOMY AND FUSION WITH POSSIBLE C5 CORPECTMY  Planned surgery date: 08/31/21  Planned Anesthesia type (if known): {Type:19480}      Known cardiac or lung diseases: +OSA  Denies MI, HF, CVA, CKD, IDDM, Asthma, COPD    Is the functional status >4 MET? (e.g.climb a flight of stairs, sex, golf, bowling, dancing, doubles tennis, yard work, throwing a baseball or football or walking up a hill):***Yes  Chest pain:***No  Shortness of breath:***No    History of reaction to anesthesia: ***No     Past Medical History:   Diagnosis Date   ? Abnormal cardiovascular stress test 06/21/2020    06/21/2020 Preop exam showed abnormal stress test with no EKG e/o ischemia but e/o large anterior wall ischemia with recommendation for cardiac catheterization. Had second opinion with repeat exercise nuclear stress test showing no e/o ischemia and was recommended against cardiac catheterization. I recommended starting Atorvastatin 40 mg every day for her DM2, but patient wishes to repeat labs fir   ? Diabetes mellitus (HCC/RAF) 02/20/2017    06/21/2020 Chronic, stable. Last HgA1c 6.6% on 06/03/19. C/w MFM 500 mg BID. Repeat HgA1c. Recommended starting Atorvastatin 40 mg every day but patient wishes to repeat labs first. Will need to clarify last retinopathy screening at next visit.    ? Hypertension, essential 06/21/2020    06/21/2020 Chronic,s table. C/w Benicar 40/25 mg x1/2 tablet every day. Reassess at next in office appointment.    ? Hypothyroidism 06/21/2020   ? Myelopathy concurrent with and due to spinal stenosis of cervical region (HCC/RAF) 06/21/2020    06/21/2020 Radiculopathy and myelopathy symptoms since forehead injury 08/25/19. MRI cervical spine 09/30/19 showing multilevel degenerative changes and central disk/osteophyte protrusion impinging the spinal cord with severe spinal stenosis and severe BL foraminal stenosis. Originally recommended for surgical intervention, which was then cancelled due to abnormal preoperative stress test. Neurosurge   ? OSA (obstructive sleep apnea) 06/21/2020    06/21/2020 Chronic, stable. C/w CPAP at bedtime.      Past Surgical History:   Procedure Laterality Date   ? CESAREAN SECTION      x2   ? CHOLECYSTECTOMY  1987   ? INCISIONAL HERNIA REPAIR  1987       Medications: {meds:317282::''reviewed medication list in the chart''}  No outpatient medications have been marked as taking for the 07/28/21 encounter (Surgical Consult) with Chelsea Lipps, DO.       Allergies: Iodinated contrast media    Review of Systems:  Constitution: Fever?  [ ]  Yes [X]  No    Eyes: Corrective or contact lenses?  [ ]  Yes [X]  No    ENMT: Sleep apnea documented by sleep study?   Recent URI?  [ ]  Yes [X]  No   [ ]  Yes [X]  No    Cardiovascular: Symptoms of stable Angina?  [ ]  Yes [X]  No    Symptoms of unstable angina?  [ ]  Yes [X]  No    Symptomatic palpitations or syncope?  [ ]  Yes [X]  No    Orthopnea? # of pillows  [ ]  Yes [X]  No    PND?  [ ]   Yes [X]  No    Is exercise limited?  [ ]  Yes [X]  No    Exercise tolerance _________     If limited, why?     Pacemaker present?   Type if known: __________________ Date last checked________  [ ]  Yes [X]  No    Respiratory: Chronic Cough?   Loud snoring?   Daytime somnolence/fatigue?   Witnessed apneic episodes?   Neck circum >40cm?  [ ]  Yes [X]  No   [ ]  Yes [X]  No   [ ]  Yes [X]  No   [ ]  Yes [X]  No   [ ]  Yes [X]  No    GI: Reflux symptoms?  [ ]  Yes [X]  No    Hepatitis or other disease requiring blood/fluid precautions needed?  [ ]  Yes [X]  No    GU: History of Prostatism or urinary retention?  [ ]  Yes [X]  No    Possibility of undetected pregnancy? LMP______  [ ]  Yes [X]  No    Skin & Extremities: Rash?  [ ]  Yes [X]  No    Metabolic/Endo: Heat or cold intolerance?  [ ]  Yes [X]  No    Polyuria/polydipsia?  [ ]  Yes [X]  No    MSK: Weakness?   Known neck instability?  [ ]  Yes [X]  No   [ ]  Yes [X]  No    Heme: history or signs or symptoms of coagulopathy?   Current aspirin therapy  [ ]  Yes [X]  No   [ ]  Yes [X]  No    Neuro: Prior CVA  [ ]  Yes [X]  No      Social History:  Social History     Socioeconomic History   ? Marital status: Single   Tobacco Use   ? Smoking status: Former     Packs/day: 0.25     Years: 5.00     Pack years: 1.25     Types: Cigarettes   ? Smokeless tobacco: Never   Vaping Use   ? Vaping Use: Never used   Substance and Sexual Activity   ? Alcohol use: Not Currently   ? Drug use: Not Currently       Family History:  No family history on file.    EXAM:  Wt (!) 260 lb 4.8 oz (118.1 kg)  ~ BMI 47.61 kg/m?     General appearance - alert, well appearing, and in no distress  Eyes - pupils equal and reactive, extraocular eye movements intact, conjunctivae not injected  Ears - bilateral TM's and external ear canals normal  Neck - supple, no significant adenopathy  Respiratory - clear to auscultation, no wheezes, rales or rhonchi, symmetric air entry  Cardiovascular - normal rate, regular rhythm, normal S1, S2, no murmurs, rubs, clicks or gallops. No carotid bruits   Extremities - no pedal edema, no clubbing or cyanosis  Skin - normal coloration and turgor, no rashes, no suspicious skin lesions noted  Neurological - alert, oriented, normal speech, no focal findings or movement disorder noted, normal gait     Labs:  Ordered as per request    Imaging:  No results found.    Impression and Recommendations:      Chelsea [name] is a 61 y.o. female who presents for Pre-Operative History and Physical    There are no diagnoses linked to this encounter.    Perioperative risk assessment for cardiovascular and pulmonary complications.  The patient is being evaluated in clinic today prior to the above proposed surgical procedure.     ? The proposed  surgical procedure is {emerg vs non-emerg:21059}, and, according to ACC/AHA guidelines, it is considered {risk:31688}.     ? Per RCRI Scoring, the patient has {RCRI:33470} of MACE    ? The patient has a functional capacity defined as: {preop function:20853::''> or = 4 METS''}.     ? The patient has the following ACC/AHA-defined active cardiac conditions: {ACC/AHA conditions:20851::''none''}.     ? The patient has the following ACC/AHA-defined clinical risk factors for perioperative cardiovascular complications: {ACC/AHA risk factors:20854::''none''}.    ? The patient and the proposed surgical procedure carry the following risk factors for post-operative pulmonary complications: {Pulm Risk Factors:20855::''none''}.    ? The patient's ASA physical status classification is: {ASA:20856::''I: Healthy, no disease outside surgical process.''}.    {PREOPERATIVE EVAULATION STATEMENT:20857}    Perioperative recommendations:  1. The following laboratory studies, diagnostic studies, and/or referrals are indicated and/or recommended preoperatively: {Studies Ordered:21060::''none indicated''}.    2. Perioperative Beta Blockers: {Indicated vs Not Indicated:20859::''not indicated''}    3. Perioperative Statins: {Indicated vs Not Indicated:20859::''not indicated''}    4. Medication management:  {Medication Interventions:20864::''None''}    5. Other perioperative recommendations:  {additional recommendation:20871::''None''}    The above plan of care, diagnosis, orders, and follow-up were discussed with the patient.  Questions related to this recommended plan of care were answered.    Signed,  Chelsea Lipps, DO   Clinical Instructor  Department of Medicine   9:39 AM 07/28/2021

## 2021-07-28 NOTE — Patient Instructions
Look into Wegovy/Ozempic for your diabetes and weight loss, which I am recommending

## 2021-07-28 NOTE — Progress Notes
PATIENT: Chelsea [name]  MRN: [id number]  DOB: 1960-12-28  DATE OF SERVICE: 07/28/2021    CHIEF COMPLAINT:   Chief Complaint   Patient presents with   ? Follow-up        HPI   Chelsea [name] is a 61 y.o. female who  has a past medical history of Abnormal cardiovascular stress test (06/21/2020), Diabetes mellitus (HCC/RAF) (02/20/2017), Hypertension, essential (06/21/2020), Hypothyroidism (06/21/2020), Myelopathy concurrent with and due to spinal stenosis of cervical region (HCC/RAF) (06/21/2020), and OSA (obstructive sleep apnea) (06/21/2020). who presents for   Chief Complaint   Patient presents with   ? Follow-up     Always declines flu and PNA vaccines.    #DM2  #Obesity  Diagnosed with HgA1c 9%.?  Initially treated with MFM then lost significant weight.  A1c improved to 6.2% and switched to diet control  Retinopathy negative and UTD July 2022  Declines pneumococcal vaccine.   Interested in semaglutide to help with weight  07/28/2021 She has been stressed and eating a lot. She thinks that she can control this with diet and exercise.   ?  HTN  On?olmesartan-hydrochlorothiazide 40/25 mg half tablet every day. BP today 147/76.  Very stressed in s/o upcoming neurosurgery for her neck in March 2023.  08/04/2021 BP great today  ?  Cervical myeloradiculopathy  Chronic cervicogenic headaches  - Onset since forehead injury 08/25/19, with evidence of cervical spinal stenosis on imaging.?Her symptoms included severe neck pain, imbalance, headaches, and tingling in the hands. Addressed thoroughly with Dr. Vickki Hearing. ?  - S/p C7-T1 interlaminar ESI on 07/28/20 with initial relief but tapered over course of a month. Residual headache since injection. Imaging demonstrates multilevel degeneration with cervical stenosis most severe at C5/6. ??  - Scheduled for cervical discectomy of C5-6 with neurosurg Dr. Konrad Felix on 08/31/21  - Remains on gabapentin 100 mg TID. Has not yet started Cymbalta yet  08/04/2021 Remains on Gabapentin. Cymbalta made her too lethargic so she stopped    Hypercholesterolemia with elevated ASCVD risk  Abnormal MR brain  History of acute disorientation, imbalance, and concussions with MR brain demonstrating possible evolving subacute to chronic infarct within the posterior insular cortex. She was started on atorvastatin and ASA 81 for risk reduction, but she eventually self-discontinued the statin therapy  Repeat MR brain 11/23/20 shows stable possible insular cortex stroke.  Followed by cards and neurology.  03/24/2021 She still refuses to restart statin. She wants to make lifestyle modifications  07/01/2021 Again refuses statins.   08/04/2021 Stopped ASA, not on statin. Patient is hesitant with starting medications.   ?  Anxiety  Very anxious esp in s/o her chronic neck pain, headaches, and confusion  History of 20y disability due to severe panic disorder?  On Hydroxyzine since ER visit, but unclear if using. Also was prescribed Cymbalta by Dr. Vickki Hearing but she has been hesitant to start.   She would also like to see a therapist.  08/04/2021 Taking Hydroxyzine TID, which helps with her anxiety.   Having dizziness and LH episodes.   ?  #OSA  Remains on CPAP at bedtime  ?  #Hypothyroidism  S/p RAI in 30's.?On LT4..  Euthyroid 09/07/20  ?  #Obesity  Interval weight gain.    Not addressed this visit  #Fibroids  S/p repeat EMB 11/11/20 with pathology negative for malignancy?    Patient Care Team:  Clint Lipps, DO as PCP - General (Family Medicine)  Berna Bue, DO as PCP - Plan Assigned [facility])  MEDS     Outpatient Medications Prior to Visit   Medication Sig   ? acetaminophen 500 mg tablet Take 2 tablets (1,000 mg total) by mouth every six (6) hours as needed for Pain.   ? ciclopirox 8% solution Apply topically at bedtime Apply to adjacent skin and affected nails daily. Remove with alcohol every 7 days. Use up to 48 weeks..   ? clindamycin 1% gel APPLY TO AFFECTED AREA(S) TWICE DAILY - for ear.   ? clobetasol 0.05% ointment Apply topically two (2) times daily APPLY AND GENTLY MASSAGE INTO AFFECTED AREA(S) TWICE DAILY. Do not apply to face, armpit, or groin..   ? clobetasol 0.05% ointment Apply topically two (2) times daily APPLY AND GENTLY MASSAGE INTO AFFECTED AREA(S) TWICE DAILY. Do not apply to face, armpit, or groin..   ? clobetasol 0.05% ointment Apply topically two (2) times daily APPLY AND GENTLY MASSAGE INTO AFFECTED AREA(S) TWICE DAILY. Do not apply to face, armpit, or groin..   ? Crisaborole (EUCRISA) 2 % OINT Apply to affected areas twice daily.   ? cyclobenzaprine 10 mg tablet Take 1 tablet (10 mg total) by mouth every eight (8) hours as needed for Muscle spasms.   ? diclofenac Sodium 1% gel apply 2 to 3 gram twice a day if needed   ? diphenhydrAMINE 50 mg capsule Take 50 mg benadryl by mouth 1 hour prior to contrast administration.   ? gabapentin (NEURONTIN) 100 mg capsule Take 1 capsule (100 mg total) by mouth three (3) times daily.   ? gabapentin 100 mg capsule .   ? hydrocortisone 1% cream Apply topically daily.   ? HYDROXYZINE 25 mg tablet take 1 tablet (25 MG TOTAL) by mouth three times a day if needed for anxiety   ? LEVOTHYROXINE 100 mcg tablet take 1 tablet by mouth every morning before breakfast   ? Magnesium Glycinate 100 MG CAPS Take 100 mg by mouth at bedtime.   ? meclizine 25 mg tablet take 1 tablet by mouth every 6 hours if needed for nausea.   ? olmesartan-hydroCHLOROthiazide 40-25 mg tablet Take 0.5 tablets by mouth daily.   ? RESTASIS 0.05 % ophthalmic emulsion instill 1 drop INTO AFFECTED EYE(S) every 12 hours   ? DULoxetine 20 mg DR capsule Take 1 capsule (20 mg total) by mouth daily.   ? ibuprofen 800 mg tablet take 1 tablet by mouth every 6 to 8 hours if needed for pain (Patient not taking: Reported on 07/25/2021.)     No facility-administered medications prior to visit.       PHYSICAL EXAM      Last Recorded Vital Signs:    07/28/21 0933   BP: 117/75   Pulse: 68   Resp: 18   Temp: (!) 35.9 ?C (96.7 ?F) SpO2: 98%     Body mass index is 47.61 kg/m?Marland Kitchen  Wt Readings from Last 3 Encounters:   07/28/21 (!) 260 lb 4.8 oz (118.1 kg)   07/01/21 (!) 264 lb (119.7 kg)   03/24/21 (!) 261 lb 12.8 oz (118.8 kg)      Gen - Awake. No acute distress. Age appropriate appearance.   Eyes - EOMI. No conjunctival pallor or injection. No scleral icterus.  Pulm - Nml effort. Clear to auscultation bilaterally. No wheezes, rales, or rhonchi.  Cards - RRR. S1 S2. No murmurs, rubs, or gallops.   MSK - No cyanosis, clubbing, or LE edema.   Neuro - AAOx3. CN2-12 grossly intact.  Psych - Normal mood  and affect.       LABS/STUDIES   I have:   []  Reviewed/ordered []  1 []  2 []  ? 3 unique laboratory, radiology, and/or diagnostic tests noted below    []  Reviewed []  1 []  2 []  ? 3 prior external notes and incorporated into patient assessment    []  Discussed management or test interpretation with external provider(s) as noted       Lab Studies:  CBC:   Results for orders placed or performed in visit on 01/07/21   CBC   Result Value Ref Range    White Blood Cell Count 7.52 4.16 - 9.95 x10E3/uL    Red Blood Cell Count 5.04 3.96 - 5.09 x10E6/uL    Hemoglobin 14.7 11.6 - 15.2 g/dL    Hematocrit 16.1 (H) 34.9 - 45.2 %    Mean Corpuscular Volume 91.5 79.3 - 98.6 fL    Mean Corpuscular Hemoglobin 29.2 26.4 - 33.4 pg    MCH Concentration 31.9 31.5 - 35.5 g/dL    Red Cell Distribution Width-SD 42.5 36.9 - 48.3 fL    Red Cell Distribution Width-CV 12.7 11.1 - 15.5 %    Platelet Count, Auto 288 143 - 398 x10E3/uL    Mean Platelet Volume 10.1 9.3 - 13.0 fL    Nucleated RBC%, automated 0.0 No Ref. Range %    Absolute Nucleated RBC Count 0.00 0.00 - 0.00 x10E3/uL    Neutrophil Abs (Prelim) 5.09 See Absolute Neut Ct. x10E3/uL   Differential, Automated   Result Value Ref Range    Neutrophil Percent, Auto 67.7 No Ref. Range %    Lymphocyte Percent, Auto 23.5 No Ref. Range %    Monocyte Percent, Auto 6.1 No Ref. Range %    Eosinophil Percent, Auto 1.7 No Ref. Range % Basophil Percent, Auto 0.7 No Ref. Range %    Immature Granulocytes% 0.3 No Reference Range %    Absolute Neut Count 5.09 1.80 - 6.90 x10E3/uL    Absolute Lymphocyte Count 1.77 1.30 - 3.40 x10E3/uL    Absolute Mono Count 0.46 0.20 - 0.80 x10E3/uL    Absolute Eos Count 0.13 0.00 - 0.50 x10E3/uL    Absolute Baso Count 0.05 0.00 - 0.10 x10E3/uL    Absolute Immature Gran Count 0.02 0.00 - 0.04 x10E3/uL   CBC & Auto Differential    Narrative    The following orders were created for panel order CBC & Auto Differential.  Procedure                               Abnormality         Status                     ---------                               -----------         ------                     WRU[[id number]]                          Abnormal            Final result               Differential, Automated[[id number]]  Final result                 Please view results for these tests on the individual orders.       CMP:   Results for orders placed or performed in visit on 01/07/21   Comprehensive Metabolic Panel   Result Value Ref Range    Sodium 140 135 - 146 mmol/L    Potassium 3.7 3.6 - 5.3 mmol/L    Chloride 99 96 - 106 mmol/L    Total CO2 31 (H) 20 - 30 mmol/L    Anion Gap 10 8 - 19 mmol/L    Glucose 153 (H) 65 - 99 mg/dL    Creatinine 1.61 0.96 - 1.30 mg/dL    Estimated GFR >04 See GFR Additional Information mL/min/1.41m2    GFR Additional Information See Comment     Urea Nitrogen 11 7 - 22 mg/dL    Calcium 54.0 (H) 8.6 - 10.4 mg/dL    Total Protein 7.6 6.1 - 8.2 g/dL    Albumin 4.3 3.9 - 5.0 g/dL    Bilirubin,Total 0.3 0.1 - 1.2 mg/dL    Alkaline Phosphatase 88 37 - 113 U/L    Aspartate Aminotransferase 18 13 - 62 U/L    Alanine Aminotransferase 18 8 - 70 U/L     Hgb A1C:   Lab Results   Component Value Date/Time    HGBA1C 7.8 (H) 07/27/2021 09:25 AM     Lipids:   Results for orders placed or performed in visit on 07/27/21   Lipid Panel   Result Value Ref Range    Cholesterol 185 See Comment mg/dL Cholesterol,LDL,Calc 981 (H) <100 mg/dL    Cholesterol, HDL 38 (L) >50 mg/dL    Triglycerides 191 (H) <150 mg/dL    Non-HDL,Chol,Calc 478 (H) <130 mg/dL      TSH:   Lab Results   Component Value Date/Time    TSH 1.6 09/07/2020 01:13 PM        A&P   Chelsea [name] is a 61 y.o. female presenting for   Chief Complaint   Patient presents with   ? Follow-up         PROBLEM & ORDERS    ICD-10-CM    1. Morbid (severe) obesity due to excess calories (HCC/RAF)  E66.01 Referral to Clinical Nutrition - Medical Nutrition Counseling including Weight Loss      2. Type 2 diabetes mellitus without complication, without long-term current use of insulin (HCC/RAF)  E11.9 Referral to Clinical Nutrition - Medical Nutrition Counseling including Weight Loss      3. Colon cancer screening  Z12.11 Fecal Immunochemical Test      4. Myelopathy concurrent with and due to spinal stenosis of cervical region (HCC/RAF)  M48.02     G99.2       5. Hypertension, essential  I10       6. Candidate for statin therapy due to risk of future cardiovascular event  Z91.89       7. GAD (generalized anxiety disorder)  F41.1       8. Postablative hypothyroidism  E89.0       9. OSA (obstructive sleep apnea)  G47.33           ASSESSMENT  Problem List Items Addressed This Visit        HCC Codes    ? Diabetes mellitus (HCC/RAF)     Overview      06/21/2020 Chronic, stable. Last HgA1c 6.6% on 06/03/19. C/w MFM  500 mg BID. Repeat HgA1c. Recommended starting Atorvastatin 40 mg every day but patient wishes to repeat labs first. Will need to clarify last retinopathy screening at next visit.   07/06/2020 Chronic, stable. HgA1c 6.7% on 06/29/20 with negative UACR. Recommended resuming MFM XR 500 mg every day, declined at this time. Start Atorvastatin 20 mg every day with plans to uptitrate. Recommended pneumococcal vaccine, which was declined at this time. Ophtho referral for retinopathy screening.   07/16/2020 Chronic, stable. Did not resume MFM XR 500 mg every day. Reiterated my recommendation to restart MFM 500 mg every day. Recommended restarted Atorvastatin 20 mg every day.   08/31/2020 Chronic, stable. Patient declines medication. C/w diet and exercise. Has appt for retinopathy screening at [facility]. Recommended PPSV23, which was declined. Recommended moderate to high intesnity statin, which was declined. Therefore will start Atorvastatin 10 mg every day.   11/22/2020 Chronic, stable. HgA1c 6.6% on 09/14/20. C/w diet and exercise. C/w Atorvastatin 10 mg every day (recommended at least 40 mg every day, which was refused).   01/14/2021 Chronic, stable. HgA1c 6.2% 01/07/21. C/w diet and exercise. Recommended daily adherence with Atorvastatin.   03/01/2021 Chronic, stable. Diet controlled. Rec pneumococcal vaccine - declined. Recommended Atorvastatin - declined. Retinopathy screening ordered.   07/28/21 Chronic, not at treatment goal. HgA1c 7.8% on 07/27/21. Recommended starting MFM. Declined. Counseled on diet and exercise. Start statin therapy.          ? Morbid (severe) obesity due to excess calories (HCC/RAF)     Overview      Encouraged DASH/ADA or Mediterranean diet with appropriate caloric portioning and focus on lean meats and vegetables while limiting salt intake to < 2000 mg/day with low fat/low sugar diet. Encouraged regular aerobic exercise for at least 15 mins/week for cardioprotective benefits and strength training at least 2 days per week. Discussed goal BMI < 30 for morbidity/mortality  benefits. Recommended bariatric surgery evaluation, which was declined at this time. Recommended resuming MFM for DM2, which was declined at this time. Consider GLP-1 agonist.   07/16/2020 Chronic, stable. Recommended resuming MFM 500 mg every day, which patient will consider. Clinical nutrition referral for dietician.   09/10/2020 Chronic, stable. Down 9 lbs since last visit!   07/01/2021 Interval weight gain. Needs to work on lifestyle modification.  07/28/21 Chronic, stable. Needs to work on lifestyle modifcation. Consider GLP-1 agonist but patient is resistant to DM medications. Clinical nutrition referral.          ? Myelopathy concurrent with and due to spinal stenosis of cervical region (HCC/RAF)     Overview      06/21/2020 Radiculopathy and myelopathy symptoms since forehead injury 08/25/19. MRI cervical spine 09/30/19 showing multilevel degenerative changes and central disk/osteophyte protrusion impinging the spinal cord with severe spinal stenosis and severe BL foraminal stenosis. Originally recommended for surgical intervention, which was then cancelled due to abnormal preoperative stress test. Neurosurgery referral provided today. She has had a h/o difficult intubation in the past.   07/06/2020 Chronic, stable. Evaluated by neurosurg with plans to repeat MRI in 1 month and consider surgical intervention. Seen by PMR who prescribed medrol dose pack. C/w Gabapentin.   07/16/2020 Chronic, stable. MRI cervical spine with moderate to severe spinal canal stenosis and mild increased T2 signal suggestive of cord edema. Medrol dosepack and Meloxicam 7.5 mg every day + Gabapentin for pain control. Recommended urgent f/u with Neurosurg.   09/10/2020  Chronic, stable. S/p ESI by Dr. Vickki Hearing on 07/28/20 with significant improvement.  Following with neurosurgery and pmr on gabapentin.  11/22/2020 Chronic, stable. Planning on surgical intervention with Dr. Konrad Felix on 01/05/21.   01/14/2021 Chronic, stable. C/w neck brace. Flexeril 10 mg TID PRN muscle spasm. F/U with neurosurgery for planned surgical intervention on 01/05/21.   03/24/2021 Chronic, stable. Surgery rescheduled 2/2 dental infection. C/w neck brace, Flexeril 10 mg TID PRN muscle spasm, gabapentin.   07/01/2021 Chronic, stable. Not yet at goal. Scheduled for C5-6 discectomy in March 2023 with Dr. Konrad Felix. Meanwhile c/w gabapentin. Should start Cymbalta as prescribed.  07/28/21 Chronic, stable. Scheduled for surgery.             Other    ? Candidate for statin therapy due to risk of future cardiovascular event     Overview      09/10/2020  Chronic, stable. Start Atorvastatin 40 mg every day given DM2 + age + abnormal MRI brain finding c/f possible evolving subacute to chronic infarct. Will start ASA 81 mg every day as well and recommended f/u with neurology to discuss.   09/22/2020 Chronic, stable. Patient is very hesitant to take cholesterol lowering medication despite counseling on indication with DM2 + age > 40 and possible subacute to chronic infarct on MRI brain. Patient agreeable to start Atorvastatin 10 mg every day, c/w ASA 81 mg every day, and f/u with neurology. Repeat MRI brain previously ordered.   11/22/2020 Chronic, stable. Poor compliance with statin therapy. C/w Atorvastatin 10 mg every day (recommended at least 40 mg every day but patient refuses). C/w ASA 81 mg every day for secondary prevention of possible lacunar stroke on MRI brain.   07/01/2021 Chronic, not at treatment goal. Again recommended mod-high intensity statin given DM2 + Age > 40 and abnormal MRI w/ c/f possible lacunar stroke, but again patient refused in favor of lifestyle modification. C/w ASA 81 mg every day for secondary prevention. Following with neurology.   07/28/21 Chronic, stable. Again recommended statin therapy. Patient agreed to start Rosuvastatin 10 mg every day.          ? GAD (generalized anxiety disorder)     Overview      Chronic, stable. Recommended SSRI, declined. C/w Hydroxyzine PRN anxiety. Also on Gabapentin for cervical radiculopathy.   07/01/2021 Chronic, worsening in s/o upcoming neurosurgery. C/w hydroxyzine PRN and gabapentin. Encouraged patient also to take Cymbalta prescribed by Dr. Vickki Hearing. Referred to [facility] for adjuvant talk therapy.  07/28/21 Chronic, stable. Now open to starting Lexapro.          ? Hypertension, essential     Overview      Chronic, stable. BP at goal. C/w Benicar 40/25 mg x1/2 tablet every day.          ? Hypothyroidism     Overview S/p RAI in 30's.  09/10/2020 Chronic, stable. Euthyroid on labs from 06/29/20. C/w current regimen of Levothyroxine 100 mcg every day.   07/08/2021 Chronic, stable. Euthyroid on labs from 09/07/20. C/w current regimen of Levothyroxine 100 mcg every day.   07/28/21 Chronic, stable. C/w Levothyroxine 100 mcg every day.          ? OSA (obstructive sleep apnea)     Overview      Chronic, stable.  Compliant with CPAP therapy.  Completed [employer] discount form for CPAP machine.              Diagnoses and all orders for this visit:    Morbid (severe) obesity due to excess calories (HCC/RAF)  -  Referral to Clinical Nutrition - Medical Nutrition Counseling including Weight Loss    Type 2 diabetes mellitus without complication, without long-term current use of insulin (HCC/RAF)  -     Referral to Clinical Nutrition - Medical Nutrition Counseling including Weight Loss    Colon cancer screening  -     Fecal Immunochemical Test; Future    Myelopathy concurrent with and due to spinal stenosis of cervical region (HCC/RAF)    Hypertension, essential    Candidate for statin therapy due to risk of future cardiovascular event    GAD (generalized anxiety disorder)    Postablative hypothyroidism    OSA (obstructive sleep apnea)    Other orders  -     escitalopram 10 mg tablet; Take 1/2 tablet once daily for 1 week. Then take 1 tablet once daily.  -     rosuvastatin 10 mg tablet; Take 1 tablet (10 mg total) by mouth at bedtime.      Orders Placed This Encounter   ? Fecal Immunochemical Test   ? Referral to Clinical Nutrition - Medical Nutrition Counseling including Weight Loss   ? escitalopram 10 mg tablet   ? rosuvastatin 10 mg tablet         The above recommendation were discussed with the patient.  The patient has all questions answered satisfactorily and is in agreement with this recommended plan of care.      Clint Lipps, DO  Assistant Professor  Department of Medicine  08/04/2021 4:55 PM

## 2021-08-04 ENCOUNTER — Telehealth: Payer: MEDICAID

## 2021-08-04 DIAGNOSIS — E890 Postprocedural hypothyroidism: Secondary | ICD-10-CM

## 2021-08-04 NOTE — Telephone Encounter
Talked to the pt- she asked for Chelsea [name] to call her.  *I see the note that she has cont.of care letter. But [employer] called her to tell her she is not covered

## 2021-08-04 NOTE — Telephone Encounter
PDL Call to Clinic    Reason for Chelsea [name] is requesting to speak to the office in regards her insurance and on coming PREOP appt     Appointment Related?  [x]  Yes  []  No     If yes;  Date:08/09/21  Time:11:00    Call warm transferred to PDL: []  Yes  []  No    Call Received by Clinic Representative:    If call not answered/not accepted, call received by Patient Services Representative:

## 2021-08-04 NOTE — Telephone Encounter
I spoke with patient and informed her that the [employer] cleared her for the appointment and that they will have Lorena call her from the [employer] dept to clarify.

## 2021-08-05 ENCOUNTER — Telehealth: Payer: MEDICAID

## 2021-08-05 ENCOUNTER — Non-Acute Institutional Stay: Payer: BLUE CROSS/BLUE SHIELD

## 2021-08-05 NOTE — Telephone Encounter
PDL Call to Clinic    Reason for Call:  Tashina with [employer] is calling requesting to speak with Crystal from Dr. Lenard Galloway office     Appointment Related?  []  Yes  [x]  No     If yes;  Date:  Time:    Call warm transferred to PDL: [x]  Yes  []  No    Call Received by Clinic Representative:  Lelan Pons  If call not answered/not accepted, call received by Patient Services Representative:

## 2021-08-05 NOTE — Telephone Encounter
I spoke with the PT informing her she needed an authorization from her PCPs office due to her insurance but she told me she has continuity of care  And  to speak with Crystal from Dr. Lenard Galloway office(which I did speak to) and Crystal informed she will call the PT back to relay the message.

## 2021-08-06 NOTE — Telephone Encounter
Patient needs a referral from pcp for dept of infectious disease. The neurologist placed a referral but the patient was told that it needs to come from the pcp. Dr. Lenard Galloway put the Dx code as [phone] (ICD-10-CM) - Cervical stenosis of spinal canal. Please Advise

## 2021-08-06 NOTE — Telephone Encounter
I spoke with the PT and tried to inform her that her visit might not be authorized by her appointment day and she will possibly be a  self pay pt. PT refused to let me explain the situation and disconnected the call.

## 2021-08-06 NOTE — Telephone Encounter
PDL Call to Clinic    Reason for Call: Returning missed call, believed it was Josue who called her.    Appointment Related?  []  Yes  [x]  No     If yes;  Date:  Time:    Call warm transferred to PDL: [x]  Yes  []  No    Call Received by Clinic Representative:    Josue    If call not answered/not accepted, call received by Patient Services Representative:

## 2021-08-06 NOTE — Telephone Encounter
I spoke with Chelsea [name] from Dr. Wilmon Pali office and she stated she will send out an authorization request form to the PTs insurance and I;ll keep the PT updated on the status of the referral.

## 2021-08-09 ENCOUNTER — Ambulatory Visit: Payer: MEDICAID

## 2021-08-09 ENCOUNTER — Telehealth: Payer: MEDICAID

## 2021-08-09 ENCOUNTER — Non-Acute Institutional Stay: Payer: BLUE CROSS/BLUE SHIELD

## 2021-08-09 ENCOUNTER — Telehealth: Payer: BLUE CROSS/BLUE SHIELD

## 2021-08-09 ENCOUNTER — Institutional Professional Consult (permissible substitution): Payer: MEDICAID

## 2021-08-09 DIAGNOSIS — E119 Type 2 diabetes mellitus without complications: Secondary | ICD-10-CM

## 2021-08-09 DIAGNOSIS — Z01810 Encounter for preprocedural cardiovascular examination: Secondary | ICD-10-CM

## 2021-08-09 DIAGNOSIS — E890 Postprocedural hypothyroidism: Secondary | ICD-10-CM

## 2021-08-09 NOTE — Telephone Encounter
Contacted and spoke with patient, informed patient or pcp will need to reach out to insurance as patient is not established at [facility] location insurance may think of apt as second opinion and may need more information.

## 2021-08-09 NOTE — Telephone Encounter
Call Back Request      Reason for call back: Per pt, she is requesting a call from Dr. Minna Antis regarding her appt today. Pt did not give further detail.     Any Symptoms:  []  Yes  [x]  No       If yes, what symptoms are you experiencing:    o Duration of symptoms (how long):    o Have you taken medication for symptoms (OTC or Rx):      If call was taken outside of clinic hours:    [] Patient or caller has been notified that this message was sent outside of normal clinic hours.     [] Patient or caller has been warm transferred to the physician's answering service. If applicable, patient or caller informed to please call us back if symptoms progress.  Patient or caller has been notified of the turnaround time of 1-2 business day(s).

## 2021-08-09 NOTE — Telephone Encounter
Appointment Accommodation Request      Appointment Type: new     Reason for sooner request: Pt advised that she needs to see a pulm doctor before the end of the  Month for a preop. CBN [phone]    Date/Time Requested (If any): open to anything before end of month.     Last seen by MD: none    Any Symptoms:  []  Yes  [x]  No       If yes, what symptoms are you experiencing:   o Duration of symptoms (how long):     Patient or caller was offered an appointment but declined.    Patient or caller was advised to seek emergency services if conditions are urgent or emergent.    Patient or caller has been notified of the turnaround time of 1-2 business (days).

## 2021-08-09 NOTE — Telephone Encounter
We spoke  Pt scheduled 08/25/2021  Explained no available appts at this time.  PT scheduled surgery 08/31/2021

## 2021-08-09 NOTE — Progress Notes
History and Physical Pre-Op Consultation  PMD: Clint Lipps, DO  08/09/2021    HPI:  Chelsea [name] is a 61 y.o. female PMH GAD, DM2, HLD, Obesity, Fibroid, HTN, Hypothyroidism, OSA, Abnormal MRI, Cervical Stenosis with Myelopathy who presents for   Chief Complaint   Patient presents with   ? Pre-op Exam       Surgeon: Dr. Lenna Gilford  Planned surgery: CERVICAL 5-6 ANTERIOR CERVICAL DISCECTOMY AND FUSION WITH POSSIBLE C5 CORPECTMY  Planned surgery date: 08/31/21  Planned Anesthesia type (if known): Unknown      Known cardiac or lung diseases:   +OSA    Is the functional status >4 MET? (e.g.climb a flight of stairs, sex, golf, bowling, dancing, doubles tennis, yard work, throwing a baseball or football or walking up a hill):Yes  Chest pain:No  Shortness of breath:No    History of reaction to anesthesia: No     Past Medical History:   Diagnosis Date   ? Abnormal cardiovascular stress test 06/21/2020    06/21/2020 Preop exam showed abnormal stress test with no EKG e/o ischemia but e/o large anterior wall ischemia with recommendation for cardiac catheterization. Had second opinion with repeat exercise nuclear stress test showing no e/o ischemia and was recommended against cardiac catheterization. I recommended starting Atorvastatin 40 mg every day for her DM2, but patient wishes to repeat labs fir   ? Diabetes mellitus (HCC/RAF) 02/20/2017    06/21/2020 Chronic, stable. Last HgA1c 6.6% on 06/03/19. C/w MFM 500 mg BID. Repeat HgA1c. Recommended starting Atorvastatin 40 mg every day but patient wishes to repeat labs first. Will need to clarify last retinopathy screening at next visit.    ? Hypertension, essential 06/21/2020    06/21/2020 Chronic,s table. C/w Benicar 40/25 mg x1/2 tablet every day. Reassess at next in office appointment.    ? Hypothyroidism 06/21/2020   ? Myelopathy concurrent with and due to spinal stenosis of cervical region (HCC/RAF) 06/21/2020    06/21/2020 Radiculopathy and myelopathy symptoms since forehead injury 08/25/19. MRI cervical spine 09/30/19 showing multilevel degenerative changes and central disk/osteophyte protrusion impinging the spinal cord with severe spinal stenosis and severe BL foraminal stenosis. Originally recommended for surgical intervention, which was then cancelled due to abnormal preoperative stress test. Neurosurge   ? OSA (obstructive sleep apnea) 06/21/2020    06/21/2020 Chronic, stable. C/w CPAP at bedtime.      Past Surgical History:   Procedure Laterality Date   ? CESAREAN SECTION      x2   ? CHOLECYSTECTOMY  1987   ? INCISIONAL HERNIA REPAIR  1987       Medications: reviewed medication list in the chart  Medications that the patient states to be currently taking   Medication Sig   ? acetaminophen 500 mg tablet Take 2 tablets (1,000 mg total) by mouth every six (6) hours as needed for Pain.   ? ciclopirox 8% solution Apply topically at bedtime Apply to adjacent skin and affected nails daily. Remove with alcohol every 7 days. Use up to 48 weeks..   ? clindamycin 1% gel APPLY TO AFFECTED AREA(S) TWICE DAILY - for ear.   ? clobetasol 0.05% ointment Apply topically two (2) times daily APPLY AND GENTLY MASSAGE INTO AFFECTED AREA(S) TWICE DAILY. Do not apply to face, armpit, or groin..   ? clobetasol 0.05% ointment Apply topically two (2) times daily APPLY AND GENTLY MASSAGE INTO AFFECTED AREA(S) TWICE DAILY. Do not apply to face, armpit, or groin..   ? clobetasol 0.05% ointment  Apply topically two (2) times daily APPLY AND GENTLY MASSAGE INTO AFFECTED AREA(S) TWICE DAILY. Do not apply to face, armpit, or groin..   ? Crisaborole (EUCRISA) 2 % OINT Apply to affected areas twice daily.   ? cyclobenzaprine 10 mg tablet Take 1 tablet (10 mg total) by mouth every eight (8) hours as needed for Muscle spasms.   ? diclofenac Sodium 1% gel apply 2 to 3 gram twice a day if needed   ? diphenhydrAMINE 50 mg capsule Take 50 mg benadryl by mouth 1 hour prior to contrast administration.   ? escitalopram 10 mg tablet Take 1/2 tablet once daily for 1 week. Then take 1 tablet once daily.   ? gabapentin (NEURONTIN) 100 mg capsule Take 1 capsule (100 mg total) by mouth three (3) times daily.   ? gabapentin 100 mg capsule .   ? hydrocortisone 1% cream Apply topically daily.   ? HYDROXYZINE 25 mg tablet take 1 tablet (25 MG TOTAL) by mouth three times a day if needed for anxiety   ? LEVOTHYROXINE 100 mcg tablet take 1 tablet by mouth every morning before breakfast   ? Magnesium Glycinate 100 MG CAPS Take 100 mg by mouth at bedtime.   ? meclizine 25 mg tablet take 1 tablet by mouth every 6 hours if needed for nausea.   ? olmesartan-hydroCHLOROthiazide 40-25 mg tablet Take 0.5 tablets by mouth daily.   ? RESTASIS 0.05 % ophthalmic emulsion instill 1 drop INTO AFFECTED EYE(S) every 12 hours   ? rosuvastatin 10 mg tablet Take 1 tablet (10 mg total) by mouth at bedtime.       Allergies: Iodinated contrast media    Review of Systems:  Constitution: Fever?  [ ]  Yes [X]  No    Eyes: Corrective or contact lenses?  [ ]  Yes [X]  No    ENMT: Sleep apnea documented by sleep study?   Recent URI?  [ ]  Yes [X]  No   [ ]  Yes [X]  No    Cardiovascular: Symptoms of stable Angina?  [ ]  Yes [X]  No    Symptoms of unstable angina?  [ ]  Yes [X]  No    Symptomatic palpitations or syncope?  [ ]  Yes [X]  No    Orthopnea? # of pillows  [ ]  Yes [X]  No    PND?  [ ]  Yes [X]  No    Is exercise limited?  [ ]  Yes [X]  No    Exercise tolerance _________     If limited, why?     Pacemaker present?   Type if known: __________________ Date last checked________  [ ]  Yes [X]  No    Respiratory: Chronic Cough?   Loud snoring?   Daytime somnolence/fatigue?   Witnessed apneic episodes?   Neck circum >40cm?  [ ]  Yes [X]  No   [ ]  Yes [X]  No   [ ]  Yes [X]  No   [ ]  Yes [X]  No   [ ]  Yes [X]  No    GI: Reflux symptoms?  [ ]  Yes [X]  No    Hepatitis or other disease requiring blood/fluid precautions needed?  [ ]  Yes [X]  No    GU: History of Prostatism or urinary retention?  [ ]  Yes [X]  No    Possibility of undetected pregnancy? LMP______  [ ]  Yes [X]  No    Skin & Extremities: Rash?  [ ]  Yes [X]  No    Metabolic/Endo: Heat or cold intolerance?  [ ]  Yes [X]  No    Polyuria/polydipsia?  [ ]   Yes [X]  No    MSK: Weakness?   Known neck instability?  [ ]  Yes [X]  No   [ ]  Yes [X]  No    Heme: history or signs or symptoms of coagulopathy?   Current aspirin therapy  [ ]  Yes [X]  No   [ ]  Yes [X]  No    Neuro: Prior CVA  [ ]  Yes [X]  No      Social History:  Social History     Socioeconomic History   ? Marital status: Single   Tobacco Use   ? Smoking status: Former     Packs/day: 0.25     Years: 5.00     Pack years: 1.25     Types: Cigarettes   ? Smokeless tobacco: Never   Vaping Use   ? Vaping Use: Never used   Substance and Sexual Activity   ? Alcohol use: Not Currently   ? Drug use: Not Currently       Family History:  No family history on file.    EXAM:  Ht 5' 2'' (1.575 m)  ~ Wt (!) 260 lb 3.2 oz (118 kg)  ~ BMI 47.59 kg/m?     General appearance - alert, well appearing, and in no distress  Eyes - pupils equal and reactive, extraocular eye movements intact, conjunctivae not injected  Neck - supple, no significant adenopathy  Respiratory - clear to auscultation, no wheezes, rales or rhonchi, symmetric air entry  Cardiovascular - normal rate, regular rhythm, normal S1, S2, no murmurs, rubs, clicks or gallops.  Extremities - no pedal edema, no clubbing or cyanosis  Skin - normal coloration and turgor, no rashes, no suspicious skin lesions noted  Neurological - alert, oriented, normal speech, no focal findings or movement disorder noted, normal gait     Labs:  Ordered as per request    Imaging:  No results found.    Impression and Recommendations:      Ellyanah Origel is a 61 y.o. female who presents for Pre-Operative History and Physical    There are no diagnoses linked to this encounter.    Perioperative risk assessment for cardiovascular and pulmonary complications.  The patient is being evaluated in clinic today prior to the above proposed surgical procedure.     ? The proposed surgical procedure is non-emergent, and, according to ACC/AHA guidelines, it is considered Moderate Risk.     ? Per RCRI Scoring, the patient has no risk factors - 0.4 percent of MACE    ? The patient has a functional capacity defined as: > or = 4 METS.     ? The patient has the following ACC/AHA-defined active cardiac conditions: none.     ? The patient has the following ACC/AHA-defined clinical risk factors for perioperative cardiovascular complications: none.    ? The patient and the proposed surgical procedure carry the following risk factors for post-operative pulmonary complications: age > 50 years*  ? obstructive sleep apnea.    ? The patient's ASA physical status classification is: II: Mild to moderate systemic disease, medically well controlled, with no functional limitation..    The patient may proceed with the proposed surgery, pending the results of any laboratory studies, diagnostics studies, and/or referrals ordered in clinic today and with consideration to any additional recommendations outlined in this consultation note. At this time, the patient?s medical conditions, management, and perioperative risk are optimized for the proposed surgery without the need for further evaluation or intervention. This assessment is supported by established guidelines  from the ACC/AHA Arelia Longest et al., 2007), ACP Herma Mering et al., 2006), and ACCP Elodia Florence et al., 2007).    Patient is medically optimized for her planned procedure. Her EKG today showed sinus rhythm and nonischemic and she has had an NM perfusion stress test that was nonischemic on 07/23/20. She wishes to be seen by a Cardiologist, Pulmonologist and Sleep specialist. It should be noted that she was reported to have a difficult intubation in the past.     Perioperative recommendations:  1. The following laboratory studies, diagnostic studies, and/or referrals are indicated and/or recommended preoperatively: see my orders in [facility].    2. Perioperative Beta Blockers: not indicated    3. Perioperative Statins: continue current regimen    4. Medication management:  Stop ASA and other antiplatelets 7 days prior to surgery  Take statin, SSRI, and antihypertensives with small sip of water on morning of surgery,     5. Other perioperative recommendations:  Other recommendations: Early mobilization, chemical VTE prophylaxis as per surgeon, incentive spirometry postoperatively    The above plan of care, diagnosis, orders, and follow-up were discussed with the patient.  Questions related to this recommended plan of care were answered.    Signed,  Clint Lipps, DO   Clinical Instructor  Department of Medicine   11:14 AM 08/09/2021

## 2021-08-09 NOTE — Patient Instructions
No NSAIDs (Ibuprofen, Advil, Alleve) for 7 days prior to surgery  Stop Aspirin 7 days prior to surgery  On the morning of surgery, take your blood pressure medicine, cholesterol medicine, and anxiety medicine with a small sip of water

## 2021-08-09 NOTE — Telephone Encounter
Call Back Request      Reason for call back: Due to no insurance authorization available, Pt rescheduled appt to 08/18/21 with Dr. Scherrie Bateman. She was originally referred by Dr. Lenard Galloway (referral# [id number]) and was seen by Dr. Orma Flaming in [facility] on 01/07/21. Pt states she is under a Continuity of Care and does not understand why an Josem Kaufmann is required for a follow up appt. She states next weeks appt is follow up on infection which she was originally seen for on 01/07/21. More recent referral placed by PCP Dr. Minna Antis however (referral#: [id number]). Pt inquiring on auth status. Please advise.     Phone:   [phone]      Any Symptoms:  []  Yes  [x]  No       If yes, what symptoms are you experiencing:    o Duration of symptoms (how long):    o Have you taken medication for symptoms (OTC or Rx):      If call was taken outside of clinic hours:    [] Patient or caller has been notified that this message was sent outside of normal clinic hours.     [] Patient or caller has been warm transferred to the physician's answering service. If applicable, patient or caller informed to please call us back if symptoms progress.  Patient or caller has been notified of the turnaround time of 1-2 business day(s).

## 2021-08-09 NOTE — Telephone Encounter
Appointment Accommodation Request      Appointment Type: new     Reason for sooner request: Pt advised that she needs to schedule an appointment for a pre op with a pulm doctor. CBN [phone]    Date/Time Requested (If any): pt wants to come in before end of month.     Last seen by MD: none    Any Symptoms:  []  Yes  [x]  No       If yes, what symptoms are you experiencing:   o Duration of symptoms (how long):     Patient or caller was offered an appointment but declined.    Patient or caller was advised to seek emergency services if conditions are urgent or emergent.    Patient or caller has been notified of the turnaround time of 1-2 business (days).

## 2021-08-09 NOTE — Telephone Encounter
Appointment Accommodation Request      Appointment Type: Clearance appt     Reason for sooner request: patient is schedule for procedure on 08/31/21 and needs cardio clearance   Patient would like a call back asap for appt     Date/Time Requested (If any): please call back to advise  Thank you     Last seen by MD: 01/12/21    Any Symptoms:  []  Yes  []  No       If yes, what symptoms are you experiencing:   o Duration of symptoms (how long):     Patient or caller was offered an appointment but declined.    Patient or caller was advised to seek emergency services if conditions are urgent or emergent.    Patient or caller has been notified of the turnaround time of 1-2 business (days).

## 2021-08-10 ENCOUNTER — Ambulatory Visit: Payer: PRIVATE HEALTH INSURANCE

## 2021-08-10 ENCOUNTER — Ambulatory Visit: Payer: MEDICAID

## 2021-08-10 DIAGNOSIS — Z01810 Encounter for preprocedural cardiovascular examination: Secondary | ICD-10-CM

## 2021-08-10 NOTE — Telephone Encounter
I spoke with patient and she asked me to send Dr. Minna Antis a message to place pulmonology referrals as stat.

## 2021-08-10 NOTE — Telephone Encounter
I spoke with patient and informed her that the request for authorization was sent to [facility].

## 2021-08-10 NOTE — Telephone Encounter
Message addressed in previous encounter.       Administrative Assistant III for  COPD Clinic  [facility] -Pulmonary Department  [address]   [place], [place] [address]  Telephone Line- [phone]  Fax-[phone]        ''Kindness extended, received, or observed beneficially impacts the physical health and feelings of everyone involved.'' -Dr. Wayne W. Dyer

## 2021-08-10 NOTE — Telephone Encounter
Call Back Request      Reason for call back: Patient would like CB from office staff in regards to her referrals. Patient advised one of her appointments was canceled due to insurance but states she's under continuity of care and would like to know if someone can assister her with continuity of care. Patient declined to provide further detail. Please advise.     Any Symptoms:  []  Yes  [x]  No       If yes, what symptoms are you experiencing:    o Duration of symptoms (how long):    o Have you taken medication for symptoms (OTC or Rx):      If call was taken outside of clinic hours:    [] Patient or caller has been notified that this message was sent outside of normal clinic hours.     [] Patient or caller has been warm transferred to the physician's answering service. If applicable, patient or caller informed to please call us back if symptoms progress.  Patient or caller has been notified of the turnaround time of 1-2 business day(s).

## 2021-08-11 ENCOUNTER — Telehealth: Payer: MEDICAID

## 2021-08-11 NOTE — Telephone Encounter
Call Back Request      Reason for call back: Pt called in and would like to know what type of referrals Dr.Vivas submitted for her and if they have been authorized.Please advise and call back pt.Thank you.    Any Symptoms:  []  Yes  [x]  No       If yes, what symptoms are you experiencing:    o Duration of symptoms (how long):    o Have you taken medication for symptoms (OTC or Rx):      If call was taken outside of clinic hours:    [] Patient or caller has been notified that this message was sent outside of normal clinic hours.     [] Patient or caller has been warm transferred to the physician's answering service. If applicable, patient or caller informed to please call us back if symptoms progress.  Patient or caller has been notified of the turnaround time of 1-2 business day(s).

## 2021-08-11 NOTE — Telephone Encounter
Call Back Request      Reason for call back: Per call patient requesting if Crystal could give her a call in regards to her surgery on 3/15    Thank you!     Any Symptoms:  []  Yes  [x]  No       If yes, what symptoms are you experiencing:    o Duration of symptoms (how long):    o Have you taken medication for symptoms (OTC or Rx):      If call was taken outside of clinic hours:    [] Patient or caller has been notified that this message was sent outside of normal clinic hours.     [] Patient or caller has been warm transferred to the physician's answering service. If applicable, patient or caller informed to please call us back if symptoms progress.  Patient or caller has been notified of the turnaround time of 1-2 business day(s).

## 2021-08-12 ENCOUNTER — Non-Acute Institutional Stay: Payer: PRIVATE HEALTH INSURANCE

## 2021-08-12 ENCOUNTER — Telehealth: Payer: MEDICAID

## 2021-08-12 NOTE — Telephone Encounter
PDL Call to Clinic    Reason for Call:patient calling to see if she still can keep her appointment her insurance told her the will retro auth    Appointment Related?  [x]  Yes  []  No     If yes;  Date:08/12/22  Time:3 pm     Call warm transferred to PDL: [x]  Yes  []  No    Call Received by Clinic Representative:Areli     If call not answered/not accepted, call received by Patient Services Representative:

## 2021-08-12 NOTE — Telephone Encounter
Transferred call to Michelle

## 2021-08-15 ENCOUNTER — Ambulatory Visit: Payer: PRIVATE HEALTH INSURANCE

## 2021-08-15 DIAGNOSIS — E785 Hyperlipidemia, unspecified: Secondary | ICD-10-CM

## 2021-08-15 DIAGNOSIS — E1159 Type 2 diabetes mellitus with other circulatory complications: Secondary | ICD-10-CM

## 2021-08-15 DIAGNOSIS — R0789 Other chest pain: Secondary | ICD-10-CM

## 2021-08-15 DIAGNOSIS — E1169 Type 2 diabetes mellitus with other specified complication: Secondary | ICD-10-CM

## 2021-08-15 DIAGNOSIS — I152 Hypertension secondary to endocrine disorders: Secondary | ICD-10-CM

## 2021-08-15 NOTE — Consults
CARDIOLOGY NOTE      PATIENT: Chelsea [name]   MRN: [id number]   DOB: 10/03/1960   DATE OF SERVICE: 08/15/2021    PCP:  Clint Lipps, DO      Problem List Items Addressed This Visit    None  Visit Diagnoses     Chest discomfort    -  Primary    Dyslipidemia associated with type 2 diabetes mellitus (HCC/RAF)        Hypertension complicating diabetes (HCC/RAF)                HPI:     This is a 61 y.o. female who is here for cardiac evaluation.  Indication:  Pre-op CV surgery    The patient plans to have spine surgery on 08/31/2021 at [facility] ([place])    The patient reports periodic chest pain, in the SSCP and L lateral chest pain  She takes antiacids which overall helps with the symptoms.   Pt reports symptoms on daily basis, lasting for about ?? Duration. She has noted symptoms for the past 2 months.   It is described as ''gas pain'', rated at 2/10.   Triggers: ''maybe milk''  ET: walks 20 minutes, over 10K      Review of system: 14 point review of system is otherwise unremarkable    Past Medical History:   Diagnosis Date   ? Abnormal cardiovascular stress test 06/21/2020    06/21/2020 Preop exam showed abnormal stress test with no EKG e/o ischemia but e/o large anterior wall ischemia with recommendation for cardiac catheterization. Had second opinion with repeat exercise nuclear stress test showing no e/o ischemia and was recommended against cardiac catheterization. I recommended starting Atorvastatin 40 mg every day for her DM2, but patient wishes to repeat labs fir   ? Diabetes mellitus (HCC/RAF) 02/20/2017    06/21/2020 Chronic, stable. Last HgA1c 6.6% on 06/03/19. C/w MFM 500 mg BID. Repeat HgA1c. Recommended starting Atorvastatin 40 mg every day but patient wishes to repeat labs first. Will need to clarify last retinopathy screening at next visit.    ? Hypertension, essential 06/21/2020    06/21/2020 Chronic,s table. C/w Benicar 40/25 mg x1/2 tablet every day. Reassess at next in office appointment.    ? Hypothyroidism 06/21/2020   ? Myelopathy concurrent with and due to spinal stenosis of cervical region (HCC/RAF) 06/21/2020    06/21/2020 Radiculopathy and myelopathy symptoms since forehead injury 08/25/19. MRI cervical spine 09/30/19 showing multilevel degenerative changes and central disk/osteophyte protrusion impinging the spinal cord with severe spinal stenosis and severe BL foraminal stenosis. Originally recommended for surgical intervention, which was then cancelled due to abnormal preoperative stress test. Neurosurge   ? OSA (obstructive sleep apnea) 06/21/2020    06/21/2020 Chronic, stable. C/w CPAP at bedtime.        Past Surgical History:   Procedure Laterality Date   ? CESAREAN SECTION      x2   ? CHOLECYSTECTOMY  1987   ? INCISIONAL HERNIA REPAIR  1987       Allergies:   Allergies   Allergen Reactions   ? Iodinated Contrast Media Anaphylaxis       Social History     Socioeconomic History   ? Marital status: Single   Tobacco Use   ? Smoking status: Former     Packs/day: 0.25     Years: 5.00     Pack years: 1.25     Types: Cigarettes   ? Smokeless tobacco: Never   Vaping Use   ?  Vaping Use: Never used   Substance and Sexual Activity   ? Alcohol use: Not Currently   ? Drug use: Not Currently       No family history on file.      Objective:     BP 127/73  ~ Pulse 69  ~ Wt (!) 258 lb (117 kg)  ~ SpO2 99%  ~ BMI 47.19 kg/m?     Vitals:    08/15/21 0953   BP: 127/73   Pulse: 69   SpO2: 99%   Weight: (!) 258 lb (117 kg)       Body mass index is 47.19 kg/m?Marland Kitchen    Wt Readings from Last 3 Encounters:   08/15/21 (!) 258 lb (117 kg)   08/09/21 (!) 260 lb 3.2 oz (118 kg)   07/28/21 (!) 260 lb 4.8 oz (118.1 kg)          Physical Exam:  General Exam: The patient is well-developed, well-nourished and in no apparent distress.   Neurologic/Psychiatric: The patient is alert and oriented. Patient's affect is appropriate.  HEENT: Head is normocephalic, atraumatic. Neck is supple with full range of motion. JVP is flat. Sclerae are anicteric.  Oral mucosa is clear. There is no epistaxis.  Cardiac: Heart is regular in rate and rhythm. There is no murmur.   Vascular: There are no carotid bruits. Radial pulses are 2+ and symmetric.    Respiratory: Breathing is non-labored. There is no use of accessory muscles.  Lungs are clear to auscultation bilaterally without wheeze, crackles, or rhonchi.  Abdomen: Bowel sounds are present. Abdomen is soft, nontender and nondistended.    Extremities: No edema. There is no cyanosis or clubbing.  Integument: There is no visible petechiae.  Back: No costovertebral angle tenderness.      Laboratory:  Lab Results   Component Value Date    NA 143 08/09/2021    K 4.1 08/09/2021    CL 104 08/09/2021    CO2 28 08/09/2021    BUN 12 08/09/2021    CREAT 0.67 08/09/2021    GLUCOSE 119 (H) 08/09/2021    CALCIUM 9.7 08/09/2021     Lab Results   Component Value Date    TROPONIN <0.04 09/07/2020     No results found for: BNP  Lab Results   Component Value Date    WBC 5.98 08/09/2021    HGB 13.4 08/09/2021    HCT 41.2 08/09/2021    MCV 88.6 08/09/2021    PLT 266 08/09/2021     Lab Results   Component Value Date    CHOL 185 07/27/2021    CHOLHDL 38 (L) 07/27/2021    CHOLDLCAL 113 (H) 07/27/2021    TRIGLY 172 (H) 07/27/2021     Lab Results   Component Value Date    PT 12.9 08/09/2021    INR 1.0 08/09/2021     No components found for: HA1C  Lab Results   Component Value Date    TSH 1.6 09/07/2020       Patient Active Problem List   Diagnosis   ? Diabetes mellitus (HCC/RAF)   ? Abnormal cardiovascular stress test   ? OSA (obstructive sleep apnea)   ? Myelopathy concurrent with and due to spinal stenosis of cervical region (HCC/RAF)   ? Hypertension, essential   ? Hypothyroidism   ? Morbid (severe) obesity due to excess calories (HCC/RAF)   ? Candidate for statin therapy due to risk of future cardiovascular event   ? Abnormal  finding on MRI of brain   ? Fibroid   ? Balance disorder   ? GAD (generalized anxiety disorder)       Outpatient Medications Prior to Visit Medication Sig   ? acetaminophen 500 mg tablet Take 2 tablets (1,000 mg total) by mouth every six (6) hours as needed for Pain.   ? ciclopirox 8% solution Apply topically at bedtime Apply to adjacent skin and affected nails daily. Remove with alcohol every 7 days. Use up to 48 weeks..   ? clindamycin 1% gel APPLY TO AFFECTED AREA(S) TWICE DAILY - for ear.   ? clobetasol 0.05% ointment Apply topically two (2) times daily APPLY AND GENTLY MASSAGE INTO AFFECTED AREA(S) TWICE DAILY. Do not apply to face, armpit, or groin..   ? clobetasol 0.05% ointment Apply topically two (2) times daily APPLY AND GENTLY MASSAGE INTO AFFECTED AREA(S) TWICE DAILY. Do not apply to face, armpit, or groin..   ? clobetasol 0.05% ointment Apply topically two (2) times daily APPLY AND GENTLY MASSAGE INTO AFFECTED AREA(S) TWICE DAILY. Do not apply to face, armpit, or groin..   ? Crisaborole (EUCRISA) 2 % OINT Apply to affected areas twice daily.   ? cyclobenzaprine 10 mg tablet Take 1 tablet (10 mg total) by mouth every eight (8) hours as needed for Muscle spasms.   ? diclofenac Sodium 1% gel apply 2 to 3 gram twice a day if needed   ? diphenhydrAMINE 50 mg capsule Take 50 mg benadryl by mouth 1 hour prior to contrast administration.   ? escitalopram 10 mg tablet Take 1/2 tablet once daily for 1 week. Then take 1 tablet once daily.   ? gabapentin (NEURONTIN) 100 mg capsule Take 1 capsule (100 mg total) by mouth three (3) times daily.   ? gabapentin 100 mg capsule .   ? hydrocortisone 1% cream Apply topically daily.   ? HYDROXYZINE 25 mg tablet take 1 tablet (25 MG TOTAL) by mouth three times a day if needed for anxiety   ? LEVOTHYROXINE 100 mcg tablet take 1 tablet by mouth every morning before breakfast   ? Magnesium Glycinate 100 MG CAPS Take 100 mg by mouth at bedtime.   ? meclizine 25 mg tablet take 1 tablet by mouth every 6 hours if needed for nausea.   ? olmesartan-hydroCHLOROthiazide 40-25 mg tablet Take 0.5 tablets by mouth daily.   ? RESTASIS 0.05 % ophthalmic emulsion instill 1 drop INTO AFFECTED EYE(S) every 12 hours   ? rosuvastatin 10 mg tablet Take 1 tablet (10 mg total) by mouth at bedtime.     No facility-administered medications prior to visit.       Current Outpatient Medications   Medication Sig   ? acetaminophen 500 mg tablet Take 2 tablets (1,000 mg total) by mouth every six (6) hours as needed for Pain.   ? ciclopirox 8% solution Apply topically at bedtime Apply to adjacent skin and affected nails daily. Remove with alcohol every 7 days. Use up to 48 weeks..   ? clindamycin 1% gel APPLY TO AFFECTED AREA(S) TWICE DAILY - for ear.   ? clobetasol 0.05% ointment Apply topically two (2) times daily APPLY AND GENTLY MASSAGE INTO AFFECTED AREA(S) TWICE DAILY. Do not apply to face, armpit, or groin..   ? clobetasol 0.05% ointment Apply topically two (2) times daily APPLY AND GENTLY MASSAGE INTO AFFECTED AREA(S) TWICE DAILY. Do not apply to face, armpit, or groin..   ? clobetasol 0.05% ointment Apply topically two (2) times daily  APPLY AND GENTLY MASSAGE INTO AFFECTED AREA(S) TWICE DAILY. Do not apply to face, armpit, or groin..   ? Crisaborole (EUCRISA) 2 % OINT Apply to affected areas twice daily.   ? cyclobenzaprine 10 mg tablet Take 1 tablet (10 mg total) by mouth every eight (8) hours as needed for Muscle spasms.   ? diclofenac Sodium 1% gel apply 2 to 3 gram twice a day if needed   ? diphenhydrAMINE 50 mg capsule Take 50 mg benadryl by mouth 1 hour prior to contrast administration.   ? escitalopram 10 mg tablet Take 1/2 tablet once daily for 1 week. Then take 1 tablet once daily.   ? gabapentin (NEURONTIN) 100 mg capsule Take 1 capsule (100 mg total) by mouth three (3) times daily.   ? gabapentin 100 mg capsule .   ? hydrocortisone 1% cream Apply topically daily.   ? HYDROXYZINE 25 mg tablet take 1 tablet (25 MG TOTAL) by mouth three times a day if needed for anxiety   ? LEVOTHYROXINE 100 mcg tablet take 1 tablet by mouth every morning before breakfast   ? Magnesium Glycinate 100 MG CAPS Take 100 mg by mouth at bedtime.   ? meclizine 25 mg tablet take 1 tablet by mouth every 6 hours if needed for nausea.   ? olmesartan-hydroCHLOROthiazide 40-25 mg tablet Take 0.5 tablets by mouth daily.   ? RESTASIS 0.05 % ophthalmic emulsion instill 1 drop INTO AFFECTED EYE(S) every 12 hours   ? rosuvastatin 10 mg tablet Take 1 tablet (10 mg total) by mouth at bedtime.     No current facility-administered medications for this visit.             2018 ACC/AHA guidelines recommends moderate or high-intensity statin because of diabetes. Patient is receiving guideline-directed statin therapy; monitor adherence.  10-Year ASCVD risk is 17.3% Continue moderate or high-intensity statin therapy. Monitor adherence as of 5:12 PM on 08/15/2021.  10-Year ASCVD risk with optimal risk factors is 3.0%.  Values used to calculate ASCVD score:  Age: 61 y.o.   Gender: Female Race: Black  HDL cholesterol: 38 mg/dL. HDL cholesterol measured on 07/27/2021.  Total cholesterol: 185 mg/dL. Total cholesterol measured on 07/27/2021.  LDL cholesterol: 113 mg/dL. LDL cholesterol measured on 07/27/2021.  Systolic BP: 127 mm Hg. BP was measured on 08/15/2021.  The patient is being treated with a medication that influences SBP.  The patient is currently not a smoker.  The patient has a diagnosis of diabetes.  Click here for the [facility] ASCVD Cardiovascular Risk Estimator Plus tool Office manager).      Assessment:     Chelsea [name] is 61 y.o. female with    Atypical chest pain  Unremarkable myoview in 2022  DM  HTN  Dyslipidemia     Plan:     If amenable, patient to start Crestor. Patient has not started it    In case Echo is unremarkable, anticipate pt to proceed with anticipated spine surgery     Orders Placed This Encounter   ? ECG 12-Lead Clinic Performed   ? Echo adult transthoracic complete           Return in about 1 week (around 08/22/2021).      There are no Patient Instructions on file for this visit.    Future Appointments   Date Time Provider Department Center   08/17/2021 10:40 AM [facility], GENERIC PROVIDER [facility] Anesthesia   08/18/2021  4:00 PM Sondra Come., MD [facility] [phone] MEDICINE  08/22/2021  7:00 AM [facility] TWENTY ECHO 01-CARDIOLOGY CARDIAC IMAGING CI SM2020 2020 Cardiac   08/22/2021 10:00 AM Clelia Croft, MD CRD DIS 2020 MEDICINE   08/23/2021  9:00 AM Vergia Alberts., NP [facility] SPINE NEUROSURGERY   08/25/2021  3:00 PM Larey Days., MD [facility] MEDICINE   08/26/2021 11:15 AM [facility] MR01 3T [facility] MR CAL ENC [facility]   08/26/2021 12:00 PM [facility] CT01 CIC CT CAL ENC [facility]         Author: Clelia Croft, MD

## 2021-08-17 ENCOUNTER — Ambulatory Visit: Payer: PRIVATE HEALTH INSURANCE

## 2021-08-17 ENCOUNTER — Telehealth: Payer: MEDICAID

## 2021-08-17 NOTE — Telephone Encounter
Call Back Request      Reason for call back: [place] [employer] is calling to check if authorization for surgery was sent ?      Please fax to [phone]    Thank you    Any Symptoms:  []  Yes  [x]  No       If yes, what symptoms are you experiencing:    o Duration of symptoms (how long):    o Have you taken medication for symptoms (OTC or Rx):      If call was taken outside of clinic hours:    [] Patient or caller has been notified that this message was sent outside of normal clinic hours.     [] Patient or caller has been warm transferred to the physician's answering service. If applicable, patient or caller informed to please call us back if symptoms progress.  Patient or caller has been notified of the turnaround time of 1-2 business day(s).

## 2021-08-17 NOTE — Patient Instructions
[facility] Clinic Discharge Instructions    Thank you for visiting the Preoperative Evaluation and [place] [place] today. An anesthesiologist is a doctor who provides care to patients like you during surgery. During your visit today, your medical condition and medications have been carefully reviewed by one of our anesthesiologists. Your anesthesiologist asks that you follow the following instructions pertaining to your upcoming procedure:    Medications:    Please DO TAKE the following medications before your surgery or procedure:    1. All of your regularly scheduled medications, EXCEPT those listed below  You may use enough water to comfortably swallow any pills on the morning of surgery.    Please DO NOT TAKE the following medications before your surgery or procedure:    Medication:     Last Dose on:  1. Olmesartan-hydrochlorothiazide  Day before your surgery  2. Aspirin     11 days before your surgery  3. Diclofenac gel (Voltaren)                             8 days before your surgery    Hydration:  It is also important to remain well hydrated before surgery. Please drink clear liquids up until 2 hours prior to your arrival time at [facility]. Clear liquids include any liquid you can see completely through: water, pedialyte, gatorade, black coffee, tea no milk.      Diagnostic Studies:  N/A    Consultations:  N/A    Smoking:  N/A    Nutrition:  N/A    Exercise:  It is important that you remain as active as possible prior to your procedure.     Other Instructions:  Please follow the ''Important Instructions Regarding Your Surgery'' that was given to you in your surgeon's office and given to you today.

## 2021-08-18 ENCOUNTER — Non-Acute Institutional Stay: Payer: MEDICAID | Attending: Infectious Disease

## 2021-08-22 ENCOUNTER — Non-Acute Institutional Stay: Payer: MEDICAID

## 2021-08-23 ENCOUNTER — Telehealth: Payer: MEDICAID

## 2021-08-23 DIAGNOSIS — M4802 Spinal stenosis, cervical region: Secondary | ICD-10-CM

## 2021-08-23 DIAGNOSIS — Z01818 Encounter for other preprocedural examination: Secondary | ICD-10-CM

## 2021-08-23 DIAGNOSIS — M5412 Radiculopathy, cervical region: Secondary | ICD-10-CM

## 2021-08-23 NOTE — Telephone Encounter
Call Back Request      Reason for call back:   [place] [employer] called to see if you faxed authorization  For surgery.      see preveious encounter     fax [phone]  Any Symptoms:  '[]'$  Yes  '[x]'$  No       If yes, what symptoms are you experiencing:    o Duration of symptoms (how long):    o Have you taken medication for symptoms (OTC or Rx):      If call was taken outside of clinic hours:    '[]'$ Patient or caller has been notified that this message was sent outside of normal clinic hours.     '[]'$ Patient or caller has been warm transferred to the physician's answering service. If applicable, patient or caller informed to please call us back if symptoms progress.  Patient or caller has been notified of the turnaround time of 1-2 business day(s).

## 2021-08-23 NOTE — H&P
[facility]  TELEMEDICINE HISTORY & PHYSICAL    Patient Consent to Telehealth Questionnaire   [facility] TELEHEALTH PRECHECKIN QUESTIONS 02/01/2021   By clicking ''I Agree'', I consent to the below:  I Agree     - I agree  to be treated via a video visit and acknowledge that I may be liable for any relevant copays or coinsurance depending on my insurance plan.  - I understand that this video visit is offered for my convenience and I am able to cancel and reschedule for an in-person appointment if I desire.  - I also acknowledge that sensitive medical information may be discussed during this video visit appointment and that it is my responsibility to locate myself in a location that ensures privacy to my own level of comfort.  - I also acknowledge that I should not be participating in a video visit in a way that could cause danger to myself or to those around me (such as driving or walking).  If my provider is concerned about my safety, I understand that they have the right to terminate the visit.  - I acknowledge that I am physically located in the [place] at the time of this visit     DATE OF SERVICE  08/23/2021   ATTENDING Casilda Carls, MD    HISTORY OF PRESENT ILLNESS     Chelsea [name] is a 61 y.o.-year-old female with a?history of Abnormal cardiovascular stress test (06/21/2020), Diabetes mellitus (HCC/RAF) (02/20/2017), Hypertension, essential (06/21/2020), Hypothyroidism (06/21/2020), Myelopathy concurrent with and due to spinal stenosis of cervical region (HCC/RAF) (06/21/2020), OSA (obstructive sleep apnea) (06/21/2020),?and 20 years disability due to severe panic disorder,?who?returns?to the clinic with neck pain, imbalance, and tingling in the hands.?The patient was last seen on 09/06/2020, at which time we recommended surgical decompression after clearance with PEPC, via ACDF at C4/5 and C5/ with potential corpectomy if unable to decompress the retrovertebro stenosis.?She?denied?any new neurological symptoms.?  ?  Today, she is scheduled to have?C5-C6 ACDF with possible C5 Corpectomy?on September 20th 2022. She has some infections in the mouth.?She has to have her infection cleared for 3 months. She has seen Dr Bretta Bang of Infectious Disease for the control of this. Her pain is persistent in the interim. Her arms, legs, are affected. She has numbness in her legs, and has persistent hip pain. She reports that her steroid injections are currently wearing off. She denies any new neurological symptoms.  ?  07/25/2021  Today the patient reports mostly stable symptoms. Her pain is improved by the injections and today she is having a good day. However, she is frequently in excruciating pain. She also states that she feels as if her grip strength is weakening. She had questions regarding her upcoming surgery which were all answered to her satisfaction. She is amenable for surgery. Her mouth infection has resolved.    The patient presents today for discussion of and consent for their upcoming CERVICAL 5-6 ANTERIOR CERVICAL DISCECTOMY AND FUSION WITH POSSIBLE C5 CORPECTOMY on 09/20/21    PAST MEDICAL HISTORY  Past Medical History:   Diagnosis Date   ? Abnormal cardiovascular stress test 06/21/2020    06/21/2020 Preop exam showed abnormal stress test with no EKG e/o ischemia but e/o large anterior wall ischemia with recommendation for cardiac catheterization. Had second opinion with repeat exercise nuclear stress test showing no e/o ischemia and was recommended against cardiac catheterization. I recommended starting Atorvastatin 40 mg every day for her DM2, but patient wishes  to repeat labs fir   ? Diabetes mellitus (HCC/RAF) 02/20/2017    06/21/2020 Chronic, stable. Last HgA1c 6.6% on 06/03/19. C/w MFM 500 mg BID. Repeat HgA1c. Recommended starting Atorvastatin 40 mg every day but patient wishes to repeat labs first. Will need to clarify last retinopathy screening at next visit.    ? Hypertension, essential 06/21/2020    06/21/2020 Chronic,s table. C/w Benicar 40/25 mg x1/2 tablet every day. Reassess at next in office appointment.    ? Hypothyroidism 06/21/2020   ? Myelopathy concurrent with and due to spinal stenosis of cervical region (HCC/RAF) 06/21/2020    06/21/2020 Radiculopathy and myelopathy symptoms since forehead injury 08/25/19. MRI cervical spine 09/30/19 showing multilevel degenerative changes and central disk/osteophyte protrusion impinging the spinal cord with severe spinal stenosis and severe BL foraminal stenosis. Originally recommended for surgical intervention, which was then cancelled due to abnormal preoperative stress test. Neurosurge   ? OSA (obstructive sleep apnea) 06/21/2020    06/21/2020 Chronic, stable. C/w CPAP at bedtime.       PAST SURGICAL HISTORY  Past Surgical History:   Procedure Laterality Date   ? CESAREAN SECTION      x2   ? CHOLECYSTECTOMY  1987   ? INCISIONAL HERNIA REPAIR  1987      SOCIAL HISTORY   reports that she has quit smoking. Her smoking use included cigarettes. She has a 1.25 pack-year smoking history. She has never used smokeless tobacco. She reports that she does not currently use alcohol. She reports that she does not currently use drugs.     FAMILY HISTORY  No family history on file.   CURRENT MEDICATIONS  No outpatient medications have been marked as taking for the 08/23/21 encounter (Telemedicine) with Vergia Alberts., NP.      ALLERGIES  Iodinated contrast media     REVIEW OF SYSTEMS  Fourteen systems are reviewed and noted in the patient's chart.     PHYSICAL EXAMINATION    No vitals taken during this visit, Telemedicine encounter. All within appearance of video frame:  GENERAL: Well appearing, no acute distress  PSYCH: Alert and oriented to person, place and time  HEENT: Normocephalic, normal sclera, neck supple  CARDIOVASCULAR: Extremities appropriate color for ethnicity and seemingly well perfused. No peripheral edema or digital cyanosis.  RESPIRATORY: No labored breathing  GASTROINTESTINAL: Abdomen benign, soft, nondistended  RANGE OF MOTION: Symmetric and within normal limits.  SKIN: intact, no rashes or lesions    DIAGNOSTIC DATA    MR Cervical:?  IMPRESSION: There are multilevel degenerative changes throughout cervical spine as described, greatest at C5-C6, where there is is posterior disc protrusion resulting in severe central canal stenosis with indentation of ventral cord. There is mild patchy T2   hyperintense signal within the cord at this level which may represent edema or myelomalacia. There are also moderate bilateral foraminal stenosis at this level.  ?  XR Cervical:?  IMPRESSION: Degenerative disease most prominent at C5-C6.  ?    ASSESSMENT    This is a 61 y.o.-year-old female with a PMH of with a?history of Abnormal cardiovascular stress test (06/21/2020), Diabetes mellitus (HCC/RAF) (02/20/2017), Hypertension, essential (06/21/2020), Hypothyroidism (06/21/2020), Myelopathy concurrent with and due to spinal stenosis of cervical region (HCC/RAF) (06/21/2020), OSA (obstructive sleep apnea) (06/21/2020),?and 20 years disability due to severe panic disorder,?who?returns?to the clinic with neck pain, imbalance, and tingling in the hands. Her symptoms have persisted in the interim. There is no new imaging to review. presenting for preoperative surgical consultation.  Chelsea [name] is currently scheduled for  CERVICAL 5-6 ANTERIOR CERVICAL DISCECTOMY AND FUSION WITH POSSIBLE C5 CORPECTOMY on 09/20/21. The surgical procedure was described in detail, as were the risks and goals of surgery.    The fluctuating nature of symptoms after spinal surgery was reviewed with Chelsea [name]. As the nerves can take months and even over a year to heal, experiencing recurrences of symptoms is expected in the postoperative period. Further, the extent of the final recovery is never known until said recovery actually occurs. Chelsea [name] should not worry if they begin experiencing temporary periods of ''bad days'' whether it be from pain or fatigue. Chelsea [name] should contact this office if they experience pain in an intensity that equals or surpasses their preoperative pains or any new neurologic symptoms arise. Chelsea [name] was instructed to refrain from using NSAIDs for the first 6 weeks postoperatively.    Chelsea [name] postoperative restrictions were reviewed in detail, specifically highlighting limitations to lifting more than 10lbs with both hands, bending at the waist, and twisting of the back or neck. Further, Chelsea [name] was instructed to continue walking for up to 20 min at a time, 2 or 3 times per day at a maximum. The postoperative brace should remain on until told otherwise by the neurosurgical team. PT will begin for postoperative reconditioning 6 weeks after surgery.    Incisional care was reviewed, namely the covering of the incision while in the shower for the first 2 weeks with plastic wrap. The incision should not be hit with direct water pressure. Water flowing over the site is allowable after the first two weeks. No rubbing or scrubbing of the site.The sutures, if any are used, will be removed at the first postoperative appointment and replaced with Steri-Strips, which should be maintained for one to two weeks after surgery. If these fall off before this time, it is appropriate to leave them off.  There may be slight swelling of the site, which is expected, so long as it is not accompanied by pain out of proportion to normal post-surgical recovery, erythema, drainage, fevers, chills.    Chelsea [name] is cleared to drive 2 weeks after surgery if they are no longer taking opioid pain medications and if they are not experiencing sedating side effects from other medications, such as muscle relaxers. Additionally, they need to be able to comfortably perform the motions needed to safely operate a vehicle.  All questions were answered and Chelsea [name] understood and was satisfied with this plan.     PLAN & FOLLOW UP     - Proceed with scheduled CERVICAL 5-6 ANTERIOR CERVICAL DISCECTOMY AND FUSION WITH POSSIBLE C5 CORPECTOMY on 09/20/21  - Surgical consents for aforementioned procedure completed and sent to patient for review and signature.     60  minutes were spent personally by me today on this encounter which may include today?s pre-visit review of the chart, time spent during the visit, and today?s time spent after the visit documenting and coordinating care.   Care discussed and plan formulated with Dr. Lenna Gilford, MD.     Chelsea Rea. Orson Aloe, MSN, NP

## 2021-08-24 MED ORDER — HYDROXYZINE HCL 25 MG PO TABS
ORAL_TABLET | 1 refills
Start: 2021-08-24 — End: ?

## 2021-08-24 MED ORDER — LEVOTHYROXINE SODIUM 100 MCG PO TABS
ORAL_TABLET | 0 refills
Start: 2021-08-24 — End: ?

## 2021-08-25 ENCOUNTER — Non-Acute Institutional Stay: Payer: MEDICAID | Attending: Student in an Organized Health Care Education/Training Program

## 2021-08-26 ENCOUNTER — Inpatient Hospital Stay: Payer: MEDICAID

## 2021-08-26 DIAGNOSIS — M4802 Spinal stenosis, cervical region: Secondary | ICD-10-CM

## 2021-08-26 MED ORDER — HYDROXYZINE HCL 25 MG PO TABS
ORAL_TABLET | 0 refills | Status: AC
Start: 2021-08-26 — End: ?

## 2021-08-26 MED ORDER — LEVOTHYROXINE SODIUM 100 MCG PO TABS
ORAL_TABLET | 0 refills | Status: AC
Start: 2021-08-26 — End: ?

## 2021-09-02 ENCOUNTER — Ambulatory Visit: Payer: MEDICAID

## 2021-09-02 NOTE — Telephone Encounter
I spoke with patient and scheduled her for an appointment with MD

## 2021-09-02 NOTE — Telephone Encounter
Per pt's note and chart review, pt was evaluated for facial numbness at [facility] urgent care on 08/31/21.  Full NT not indicated at this time.    Pt was advised to f/u with PCP. Will request f/u from office staff.

## 2021-09-05 ENCOUNTER — Telehealth: Payer: MEDICAID

## 2021-09-05 NOTE — Telephone Encounter
PDL Call to Clinic    Reason for Call:  Shanon Brow with Dr. Augustin Coupe office requesting for Preop to be faxed over again. He confirmed it was previously received when I advised it had been sent.  Appointment Related?  '[]'$  Yes  '[x]'$  No     If yes;  Date:  Time:    Call warm transferred to PDL: '[]'$  Yes  '[x]'$  No    Call Received by Clinic Representative:  Estella  If call not answered/not accepted, call received by Patient Services Representative:

## 2021-09-05 NOTE — Telephone Encounter
Ailene MA in Dr. Kennyth Arnold office is asking to fax the pre-op orders for patient today. Patient is scheduled for today at 1:40pm.    Please fax before patient arrives      Fax # [phone]. [phone]          [facility] [phone]

## 2021-09-06 ENCOUNTER — Ambulatory Visit: Payer: MEDICAID

## 2021-09-06 DIAGNOSIS — E119 Type 2 diabetes mellitus without complications: Secondary | ICD-10-CM

## 2021-09-06 DIAGNOSIS — R0602 Shortness of breath: Secondary | ICD-10-CM

## 2021-09-06 DIAGNOSIS — E890 Postprocedural hypothyroidism: Secondary | ICD-10-CM

## 2021-09-06 NOTE — Patient Instructions
I recommend that you take Lexapro 10 mg once daily for your anxiety    I also recommend that you take Rosuvastatin 10 mg once daily for your cholesterol levels given your diabetes    I recommend that you discuss with your new primary care physician regarding the numbness on the side of your face and whether you should see a neurologist for this issue    I also recommended that you take either Metformin or GLP-1 Agonist such as AXENMM

## 2021-09-06 NOTE — Progress Notes
PATIENT: Chelsea [name]  MRN: [id number]  DOB: January 12, 1961  DATE OF SERVICE: 09/06/2021    CHIEF COMPLAINT:   Chief Complaint   Patient presents with   ? Post Hospital Discharge Follow Up        HPI   Chelsea [name] is a 61 y.o. female who  has a past medical history of Abnormal cardiovascular stress test (06/21/2020), Diabetes mellitus (HCC/RAF) (02/20/2017), Hypertension, essential (06/21/2020), Hypothyroidism (06/21/2020), Myelopathy concurrent with and due to spinal stenosis of cervical region (HCC/RAF) (06/21/2020), and OSA (obstructive sleep apnea) (06/21/2020). who presents for   Chief Complaint   Patient presents with   ? Post Hospital Discharge Follow Up     HTN  On?olmesartan-hydrochlorothiazide 40/25 mg half tablet every day.   ?  Cervical myeloradiculopathy  Chronic cervicogenic headaches  - Onset since forehead injury 08/25/19, with evidence of cervical spinal stenosis on imaging.?Her symptoms included severe neck pain, imbalance, headaches, and tingling in the hands. Addressed thoroughly with Dr. Vickki Hearing. ?  - S/p C7-T1 interlaminar ESI on 07/28/20 with initial relief but tapered over course of a month. Residual headache since injection. Imaging demonstrates multilevel degeneration with cervical stenosis most severe at C5/6. ??  - Scheduled for cervical discectomy of C5-6 with neurosurg Dr. Konrad Felix on 08/31/21  - Remains on gabapentin 100 mg TID. Has not yet started Cymbalta yet  09/06/2021 Going for surgery in early April. Remains on Gabapentin 100 mg TID.   ?  Anxiety  Very anxious esp in s/o her chronic neck pain, headaches, and confusion  History of 20y disability due to severe panic disorder?  On Hydroxyzine since ER visit, but unclear if using. Also was prescribed Cymbalta by Dr. Vickki Hearing but she has been hesitant to start.   She would also like to see a therapist.  09/06/2021 Did not start Lexapro as prescribed. Remains on Hydroxyzine 25 mg TID PRN anxiety. Feels stressed as her insurance changed coverage. Imbalanced  Dizziness  Having dizziness and LH episodes.   ?  Hypercholesterolemia with elevated ASCVD risk  Abnormal MR brain  History of acute disorientation, imbalance, and concussions with MR brain demonstrating possible evolving subacute to chronic infarct within the posterior insular cortex. She was started on atorvastatin and ASA 81 for risk reduction, but she eventually self-discontinued the statin therapy  Repeat MR brain 11/23/20 shows stable possible insular cortex stroke.  Followed by cards and neurology.  03/24/2021 She still refuses to restart statin. She wants to make lifestyle modifications  07/01/2021 Again refuses statins.   09/06/2021 Again refuses statin. She has a prescription for Rosuvastatin 10 mg every day. She reports numbness at left side of face for the past 2 weeks. No other neurologic deficits.   ?  #DM2  Diagnosed with HgA1c 9%.?  Initially treated with MFM then lost significant weight.  A1c improved to 6.2% and switched to diet control  Retinopathy negative and UTD July 2022  Declines pneumococcal vaccine.   Interested in semaglutide to help with weight  09/06/2021 HgA1c 7.2% on 08/09/21 and 6.7% on 09/06/21  ?  #OSA  Remains on CPAP at bedtime  ?  #Hypothyroidism  S/p RAI in 30's.?On LT4..  Euthyroid 09/07/20  09/06/2021 Euthyroid on 09/06/21    Not addressed this visit:?  #Fibroids  S/p repeat EMB 11/11/20 with pathology negative for malignancy?  ?  #Obesity  Interval weight gain.    Patient Care Team:  Clint Lipps, DO as PCP - General (Family Medicine)  Berna Bue, DO as PCP -  Plan Assigned (Family Medicine)    MEDS     Outpatient Medications Prior to Visit   Medication Sig   ? acetaminophen 500 mg tablet Take 2 tablets (1,000 mg total) by mouth every six (6) hours as needed for Pain.   ? ciclopirox 8% solution Apply topically at bedtime Apply to adjacent skin and affected nails daily. Remove with alcohol every 7 days. Use up to 48 weeks..   ? clindamycin 1% gel APPLY TO AFFECTED AREA(S) TWICE DAILY - for ear.   ? clobetasol 0.05% ointment Apply topically two (2) times daily APPLY AND GENTLY MASSAGE INTO AFFECTED AREA(S) TWICE DAILY. Do not apply to face, armpit, or groin..   ? clobetasol 0.05% ointment Apply topically two (2) times daily APPLY AND GENTLY MASSAGE INTO AFFECTED AREA(S) TWICE DAILY. Do not apply to face, armpit, or groin..   ? clobetasol 0.05% ointment Apply topically two (2) times daily APPLY AND GENTLY MASSAGE INTO AFFECTED AREA(S) TWICE DAILY. Do not apply to face, armpit, or groin..   ? Crisaborole (EUCRISA) 2 % OINT Apply to affected areas twice daily.   ? cyclobenzaprine 10 mg tablet Take 1 tablet (10 mg total) by mouth every eight (8) hours as needed for Muscle spasms.   ? diclofenac Sodium 1% gel apply 2 to 3 gram twice a day if needed   ? diphenhydrAMINE 50 mg capsule Take 50 mg benadryl by mouth 1 hour prior to contrast administration.   ? escitalopram 10 mg tablet Take 1/2 tablet once daily for 1 week. Then take 1 tablet once daily.   ? gabapentin (NEURONTIN) 100 mg capsule Take 1 capsule (100 mg total) by mouth three (3) times daily.   ? gabapentin 100 mg capsule .   ? hydrocortisone 1% cream Apply topically daily.   ? HYDROXYZINE 25 mg tablet take 1 tablet (25 MG TOTAL) by mouth three times a day if needed for anxiety   ? LEVOTHYROXINE 100 mcg tablet take 1 tablet by mouth every morning before breakfast   ? Magnesium Glycinate 100 MG CAPS Take 100 mg by mouth at bedtime.   ? meclizine 25 mg tablet take 1 tablet by mouth every 6 hours if needed for nausea.   ? olmesartan-hydroCHLOROthiazide 40-25 mg tablet Take 0.5 tablets by mouth daily.   ? RESTASIS 0.05 % ophthalmic emulsion instill 1 drop INTO AFFECTED EYE(S) every 12 hours   ? rosuvastatin 10 mg tablet Take 1 tablet (10 mg total) by mouth at bedtime.     No facility-administered medications prior to visit.       PHYSICAL EXAM      Last Recorded Vital Signs:    09/06/21 1112   BP: 141/75   Pulse: 72 Temp: 36.1 ?C (97 ?F)   SpO2: 100%     Body mass index is 46.99 kg/m?Marland Kitchen  Wt Readings from Last 3 Encounters:   09/06/21 (!) 256 lb 14.4 oz (116.5 kg)   08/17/21 (!) 256 lb (116.1 kg)   08/15/21 (!) 258 lb (117 kg)      Gen - Awake. No acute distress. Age appropriate appearance.   Eyes - EOMI. No conjunctival pallor or injection. No scleral icterus.   Pulm - Nml effort. Clear to auscultation bilaterally. No wheezes, rales, or rhonchi.  Cards - RRR. S1 S2. No murmurs, rubs, or gallops.   GI - Soft. Nondistended. Nontender to palpation in all quadrants. No rebound, guarding, or rigidity.   MSK - No cyanosis, clubbing, or LE edema.  Neuro - AAOx3. CN2-12 grossly intact.  Psych - Normal mood and affect.       LABS/STUDIES   I have:   []  Reviewed/ordered []  1 []  2 []  ? 3 unique laboratory, radiology, and/or diagnostic tests noted below    []  Reviewed []  1 []  2 []  ? 3 prior external notes and incorporated into patient assessment    []  Discussed management or test interpretation with external provider(s) as noted       Lab Studies:  CBC:   Results for orders placed or performed in visit on 08/09/21   CBC   Result Value Ref Range    White Blood Cell Count 5.98 4.16 - 9.95 x10E3/uL    Red Blood Cell Count 4.65 3.96 - 5.09 x10E6/uL    Hemoglobin 13.4 11.6 - 15.2 g/dL    Hematocrit 16.1 09.6 - 45.2 %    Mean Corpuscular Volume 88.6 79.3 - 98.6 fL    Mean Corpuscular Hemoglobin 28.8 26.4 - 33.4 pg    MCH Concentration 32.5 31.5 - 35.5 g/dL    Red Cell Distribution Width-SD 41.7 36.9 - 48.3 fL    Red Cell Distribution Width-CV 12.9 11.1 - 15.5 %    Platelet Count, Auto 266 143 - 398 x10E3/uL    Mean Platelet Volume 10.0 9.3 - 13.0 fL    Nucleated RBC%, automated 0.0 No Ref. Range %    Absolute Nucleated RBC Count 0.00 0.00 - 0.00 x10E3/uL    Neutrophil Abs (Prelim) 3.58 See Absolute Neut Ct. x10E3/uL   Differential, Automated   Result Value Ref Range    Neutrophil Percent, Auto 59.8 No Ref. Range %    Lymphocyte Percent, Auto 30.1 No Ref. Range %    Monocyte Percent, Auto 7.2 No Ref. Range %    Eosinophil Percent, Auto 2.2 No Ref. Range %    Basophil Percent, Auto 0.5 No Ref. Range %    Immature Granulocytes% 0.2 No Reference Range %    Absolute Neut Count 3.58 1.80 - 6.90 x10E3/uL    Absolute Lymphocyte Count 1.80 1.30 - 3.40 x10E3/uL    Absolute Mono Count 0.43 0.20 - 0.80 x10E3/uL    Absolute Eos Count 0.13 0.00 - 0.50 x10E3/uL    Absolute Baso Count 0.03 0.00 - 0.10 x10E3/uL    Absolute Immature Gran Count 0.01 0.00 - 0.04 x10E3/uL   CBC & Auto Differential    Narrative    The following orders were created for panel order CBC & Auto Differential.  Procedure                               Abnormality         Status                     ---------                               -----------         ------                     EAV[[id number]]                                              Final  result               Differential, Automated[[id number]]                          Final result                 Please view results for these tests on the individual orders.       CMP:   Results for orders placed or performed in visit on 01/07/21   Comprehensive Metabolic Panel   Result Value Ref Range    Sodium 140 135 - 146 mmol/L    Potassium 3.7 3.6 - 5.3 mmol/L    Chloride 99 96 - 106 mmol/L    Total CO2 31 (H) 20 - 30 mmol/L    Anion Gap 10 8 - 19 mmol/L    Glucose 153 (H) 65 - 99 mg/dL    Creatinine 1.61 0.96 - 1.30 mg/dL    Estimated GFR >04 See GFR Additional Information mL/min/1.60m2    GFR Additional Information See Comment     Urea Nitrogen 11 7 - 22 mg/dL    Calcium 54.0 (H) 8.6 - 10.4 mg/dL    Total Protein 7.6 6.1 - 8.2 g/dL    Albumin 4.3 3.9 - 5.0 g/dL    Bilirubin,Total 0.3 0.1 - 1.2 mg/dL    Alkaline Phosphatase 88 37 - 113 U/L    Aspartate Aminotransferase 18 13 - 62 U/L    Alanine Aminotransferase 18 8 - 70 U/L     Hgb A1C:   Lab Results   Component Value Date/Time    HGBA1C 7.2 (H) 08/09/2021 11:45 AM     Lipids:   Results for orders placed or performed in visit on 07/27/21   Lipid Panel   Result Value Ref Range    Cholesterol 185 See Comment mg/dL    Cholesterol,LDL,Calc 113 (H) <100 mg/dL    Cholesterol, HDL 38 (L) >50 mg/dL    Triglycerides 981 (H) <150 mg/dL    Non-HDL,Chol,Calc 191 (H) <130 mg/dL      TSH:   Lab Results   Component Value Date/Time    TSH 1.6 09/07/2020 01:13 PM          A&P   Kentrell Guettler is a 61 y.o. female presenting for   Chief Complaint   Patient presents with   ? Post Hospital Discharge Follow Up         PROBLEM & ORDERS    ICD-10-CM    1. Type 2 diabetes mellitus without complication, without long-term current use of insulin (HCC/RAF)  E11.9       2. GAD (generalized anxiety disorder)  F41.1       3. OSA (obstructive sleep apnea)  G47.33       4. Postablative hypothyroidism  E89.0       5. SOB (shortness of breath)  R06.02 Referral to Pulmonary Disease     CANCELED: Referral to Pulmonary Disease      6. Abnormal cardiovascular stress test  R94.39 Referral to Cardiology     CANCELED: Referral to Cardiology      7. Morbid (severe) obesity due to excess calories (HCC/RAF)  E66.01       8. Myelopathy concurrent with and due to spinal stenosis of cervical region (HCC/RAF)  M48.02     G99.2       9. Candidate for statin therapy due to risk of future cardiovascular event  Z91.89  ASSESSMENT  #SOB  Query anxiety  -Pulmonology referral at patient's request    Problem List Items Addressed This Visit        HCC Codes    ? Diabetes mellitus (HCC/RAF)     Overview      06/21/2020 Chronic, stable. Last HgA1c 6.6% on 06/03/19. C/w MFM 500 mg BID. Repeat HgA1c. Recommended starting Atorvastatin 40 mg every day but patient wishes to repeat labs first. Will need to clarify last retinopathy screening at next visit.   07/06/2020 Chronic, stable. HgA1c 6.7% on 06/29/20 with negative UACR. Recommended resuming MFM XR 500 mg every day, declined at this time. Start Atorvastatin 20 mg every day with plans to uptitrate. Recommended pneumococcal vaccine, which was declined at this time. Ophtho referral for retinopathy screening.   07/16/2020 Chronic, stable. Did not resume MFM XR 500 mg every day. Reiterated my recommendation to restart MFM 500 mg every day. Recommended restarted Atorvastatin 20 mg every day.   08/31/2020 Chronic, stable. Patient declines medication. C/w diet and exercise. Has appt for retinopathy screening at [facility]. Recommended PPSV23, which was declined. Recommended moderate to high intesnity statin, which was declined. Therefore will start Atorvastatin 10 mg every day.   11/22/2020 Chronic, stable. HgA1c 6.6% on 09/14/20. C/w diet and exercise. C/w Atorvastatin 10 mg every day (recommended at least 40 mg every day, which was refused).   01/14/2021 Chronic, stable. HgA1c 6.2% 01/07/21. C/w diet and exercise. Recommended daily adherence with Atorvastatin.   03/01/2021 Chronic, stable. Diet controlled. Rec pneumococcal vaccine - declined. Recommended Atorvastatin - declined. Retinopathy screening ordered.   07/28/21 Chronic, not at treatment goal. HgA1c 7.8% on 07/27/21. Recommended starting MFM. Declined. Counseled on diet and exercise. Start statin therapy.   09/06/2021 Chronic, stable. HgA1c 6.7% on 09/06/21. Recommended starting MFM vs GLP-1 agonist. She wishes to consider. Counseled on diet and exercise. Strongly recommended starting statin therapy as prescribed.          ? Morbid (severe) obesity due to excess calories (HCC/RAF)     Overview      Encouraged DASH/ADA or Mediterranean diet with appropriate caloric portioning and focus on lean meats and vegetables while limiting salt intake to < 2000 mg/day with low fat/low sugar diet. Encouraged regular aerobic exercise for at least 15 mins/week for cardioprotective benefits and strength training at least 2 days per week. Discussed goal BMI < 30 for morbidity/mortality  benefits. Recommended bariatric surgery evaluation, which was declined at this time. Recommended resuming MFM for DM2, which was declined at this time. Consider GLP-1 agonist.   07/16/2020 Chronic, stable. Recommended resuming MFM 500 mg every day, which patient will consider. Clinical nutrition referral for dietician.   09/10/2020 Chronic, stable. Down 9 lbs since last visit!   07/01/2021 Interval weight gain. Needs to work on lifestyle modification.  07/28/21 Chronic, stable. Needs to work on lifestyle modifcation. Consider GLP-1 agonist but patient is resistant to DM medications. Clinical nutrition referral.   09/06/2021 Chronic, stable. Counseled on diet and exercise. Discussed GLP-1 agonist, patient wishes to consider.          ? Myelopathy concurrent with and due to spinal stenosis of cervical region (HCC/RAF)     Overview      06/21/2020 Radiculopathy and myelopathy symptoms since forehead injury 08/25/19. MRI cervical spine 09/30/19 showing multilevel degenerative changes and central disk/osteophyte protrusion impinging the spinal cord with severe spinal stenosis and severe BL foraminal stenosis. Originally recommended for surgical intervention, which was then cancelled due to abnormal  preoperative stress test. Neurosurgery referral provided today. She has had a h/o difficult intubation in the past.   07/06/2020 Chronic, stable. Evaluated by neurosurg with plans to repeat MRI in 1 month and consider surgical intervention. Seen by PMR who prescribed medrol dose pack. C/w Gabapentin.   07/16/2020 Chronic, stable. MRI cervical spine with moderate to severe spinal canal stenosis and mild increased T2 signal suggestive of cord edema. Medrol dosepack and Meloxicam 7.5 mg every day + Gabapentin for pain control. Recommended urgent f/u with Neurosurg.   09/10/2020  Chronic, stable. S/p ESI by Dr. Vickki Hearing on 07/28/20 with significant improvement. Following with neurosurgery and pmr on gabapentin.  11/22/2020 Chronic, stable. Planning on surgical intervention with Dr. Konrad Felix on 01/05/21.   01/14/2021 Chronic, stable. C/w neck brace. Flexeril 10 mg TID PRN muscle spasm. F/U with neurosurgery for planned surgical intervention on 01/05/21.   03/24/2021 Chronic, stable. Surgery rescheduled 2/2 dental infection. C/w neck brace, Flexeril 10 mg TID PRN muscle spasm, gabapentin.   07/01/2021 Chronic, stable. Not yet at goal. Scheduled for C5-6 discectomy in March 2023 with Dr. Konrad Felix. Meanwhile c/w gabapentin. Should start Cymbalta as prescribed.  09/06/2021  Chronic, stable. Scheduled for surgery. C/w Gabapentin 100 mg TID.             Other    ? Abnormal cardiovascular stress test     Overview      Preop exam showed abnormal stress test with no EKG e/o ischemia but e/o large anterior wall ischemia with recommendation for cardiac catheterization. Had second opinion with repeat exercise nuclear stress test showing no e/o ischemia and was recommended against cardiac catheterization. Seen by Cardiology Dr. Theodoro Grist with repeat NM treadmill stress test 07/23/20 without e/o inducible ischemia.   Discussed with patient that there is no indication for repeat stress testing but patient wishes to consult with cardiology. Referral provided.          ? Candidate for statin therapy due to risk of future cardiovascular event     Overview      09/10/2020  Chronic, stable. Start Atorvastatin 40 mg every day given DM2 + age + abnormal MRI brain finding c/f possible evolving subacute to chronic infarct. Will start ASA 81 mg every day as well and recommended f/u with neurology to discuss.   09/22/2020 Chronic, stable. Patient is very hesitant to take cholesterol lowering medication despite counseling on indication with DM2 + age > 40 and possible subacute to chronic infarct on MRI brain. Patient agreeable to start Atorvastatin 10 mg every day, c/w ASA 81 mg every day, and f/u with neurology. Repeat MRI brain previously ordered.   11/22/2020 Chronic, stable. Poor compliance with statin therapy. C/w Atorvastatin 10 mg every day (recommended at least 40 mg every day but patient refuses). C/w ASA 81 mg every day for secondary prevention of possible lacunar stroke on MRI brain.   07/01/2021 Chronic, not at treatment goal. Again recommended mod-high intensity statin given DM2 + Age > 40 and abnormal MRI w/ c/f possible lacunar stroke, but again patient refused in favor of lifestyle modification. C/w ASA 81 mg every day for secondary prevention. Following with neurology.   07/28/21 Chronic, stable. Again recommended statin therapy. Patient agreed to start Rosuvastatin 10 mg every day.   09/06/2021 Chronic, stable. Again recommended starting statin therapy.          ? GAD (generalized anxiety disorder)     Overview      Chronic, stable. Recommended SSRI, declined. C/w Hydroxyzine  PRN anxiety. Also on Gabapentin for cervical radiculopathy.   07/01/2021 Chronic, worsening in s/o upcoming neurosurgery. C/w hydroxyzine PRN and gabapentin. Encouraged patient also to take Cymbalta prescribed by Dr. Vickki Hearing. Referred to [facility] for adjuvant talk therapy.  07/28/21 Chronic, stable. Now open to starting Lexapro.   09/06/2021 Chronic, stable. Recommended starting Lexapro as prescribed.          ? Hypothyroidism     Overview      S/p RAI in 30's.  09/06/2021 Chronic, stable. Euthyroid on 09/06/21. C/w Levothyroxine 100 mcg every day.          ? OSA (obstructive sleep apnea)     Overview      Chronic, stable.  Compliant with CPAP therapy.  C/w CPAP at bedtime.               Diagnoses and all orders for this visit:    Type 2 diabetes mellitus without complication, without long-term current use of insulin (HCC/RAF)    GAD (generalized anxiety disorder)    OSA (obstructive sleep apnea)    Postablative hypothyroidism    SOB (shortness of breath)  -     Cancel: Referral to Pulmonary Disease  -     Referral to Pulmonary Disease    Abnormal cardiovascular stress test  -     Cancel: Referral to Cardiology  -     Referral to Cardiology    Morbid (severe) obesity due to excess calories (HCC/RAF)    Myelopathy concurrent with and due to spinal stenosis of cervical region (HCC/RAF)    Candidate for statin therapy due to risk of future cardiovascular event      Orders Placed This Encounter   ? Referral to Cardiology   ? Referral to Pulmonary Disease       The above recommendation were discussed with the patient.  The patient has all questions answered satisfactorily and is in agreement with this recommended plan of care.        Return in about 6 weeks (around 10/18/2021) for F/U Lexapro.     Clint Lipps, DO  Assistant Clinical Professor  Department of Medicine  09/06/2021 11:30 AM

## 2021-09-07 ENCOUNTER — Ambulatory Visit: Payer: MEDICAID

## 2021-09-07 DIAGNOSIS — E119 Type 2 diabetes mellitus without complications: Secondary | ICD-10-CM

## 2021-09-07 MED ORDER — CHLORHEXIDINE GLUCONATE 4 % EX LIQD
Freq: Every day | TOPICAL | 0 refills | Status: AC | PRN
Start: 2021-09-07 — End: ?

## 2021-09-07 MED ORDER — WEGOVY 0.25 MG/0.5ML SC SOAJ
.25 mg | SUBCUTANEOUS | 0 refills | Status: AC
Start: 2021-09-07 — End: ?

## 2021-09-07 MED ORDER — WEGOVY 0.5 MG/0.5ML SC SOAJ
.5 mg | SUBCUTANEOUS | 0 refills | Status: AC
Start: 2021-09-07 — End: ?

## 2021-09-07 MED ORDER — WEGOVY 1 MG/0.5ML SC SOAJ
1 mg | SUBCUTANEOUS | 0 refills | Status: AC
Start: 2021-09-07 — End: ?

## 2021-09-20 ENCOUNTER — Ambulatory Visit: Payer: MEDICAID

## 2021-09-20 MED ORDER — HYDROXYZINE HCL 25 MG PO TABS
ORAL_TABLET | 1 refills | Status: AC
Start: 2021-09-20 — End: ?

## 2021-10-20 ENCOUNTER — Ambulatory Visit: Payer: MEDICAID

## 2021-11-09 MED ORDER — ESCITALOPRAM OXALATE 10 MG PO TABS
10 mg | ORAL_TABLET | Freq: Every day | ORAL | 0 refills | Status: AC
Start: 2021-11-09 — End: ?

## 2021-11-29 MED ORDER — GABAPENTIN 100 MG PO CAPS
ORAL_CAPSULE
Start: 2021-11-29 — End: ?

## 2022-01-28 MED ORDER — ROSUVASTATIN CALCIUM 10 MG PO TABS
10 mg | ORAL_TABLET | Freq: Every evening | ORAL | 1 refills
Start: 2022-01-28 — End: ?

## 2022-01-30 MED ORDER — ROSUVASTATIN CALCIUM 10 MG PO TABS
10 mg | ORAL_TABLET | Freq: Every evening | ORAL | 0 refills | Status: AC
Start: 2022-01-30 — End: ?

## 2022-02-03 ENCOUNTER — Ambulatory Visit: Payer: MEDICAID

## 2022-02-08 MED ORDER — HYDROXYZINE HCL 25 MG PO TABS
ORAL_TABLET | 1 refills | Status: AC
Start: 2022-02-08 — End: ?

## 2022-02-09 ENCOUNTER — Telehealth: Payer: PRIVATE HEALTH INSURANCE

## 2022-02-09 NOTE — Telephone Encounter
Message to Practice/Provider      Message: Sandrea Hammond from [facility] called to obtain the office visit note from NP [facility] on 08/23/21. Please fax to [phone]. Thank you.     Return call is not being requested by the patient or caller.    Patient or caller has been notified of the turnaround time of 1-2 business day(s).

## 2022-02-10 MED ORDER — ESCITALOPRAM OXALATE 10 MG PO TABS
10 mg | ORAL_TABLET | Freq: Every day | ORAL | 0 refills | Status: AC
Start: 2022-02-10 — End: ?

## 2022-05-10 MED ORDER — ROSUVASTATIN CALCIUM 10 MG PO TABS
10 mg | ORAL_TABLET | Freq: Every evening | ORAL | 0 refills | 90.00000 days
Start: 2022-05-10 — End: ?

## 2022-05-11 MED ORDER — ROSUVASTATIN CALCIUM 10 MG PO TABS
10 mg | ORAL_TABLET | Freq: Every evening | ORAL | 0 refills | 90.00000 days
Start: 2022-05-11 — End: ?

## 2022-05-15 MED ORDER — ROSUVASTATIN CALCIUM 10 MG PO TABS
10 mg | ORAL_TABLET | Freq: Every evening | ORAL | 0 refills | 90.00000 days | Status: AC
Start: 2022-05-15 — End: ?

## 2022-05-23 MED ORDER — ESCITALOPRAM OXALATE 10 MG PO TABS
10 mg | ORAL_TABLET | Freq: Every day | ORAL | 0 refills
Start: 2022-05-23 — End: ?

## 2022-05-24 MED ORDER — ESCITALOPRAM OXALATE 10 MG PO TABS
10 mg | ORAL_TABLET | Freq: Every day | ORAL | 0 refills | Status: AC
Start: 2022-05-24 — End: ?

## 2022-06-24 MED ORDER — ESCITALOPRAM OXALATE 10 MG PO TABS
10 mg | ORAL_TABLET | Freq: Every day | ORAL | 0 refills
Start: 2022-06-24 — End: ?

## 2022-06-28 MED ORDER — ESCITALOPRAM OXALATE 10 MG PO TABS
10 mg | ORAL_TABLET | Freq: Every day | ORAL | 0 refills | Status: AC
Start: 2022-06-28 — End: ?

## 2022-07-21 MED ORDER — ESCITALOPRAM OXALATE 10 MG PO TABS
10 mg | ORAL_TABLET | Freq: Every day | ORAL | 0 refills | Status: AC
Start: 2022-07-21 — End: ?

## 2022-08-07 MED ORDER — WEGOVY 0.25 MG/0.5ML SC SOAJ
0 refills
Start: 2022-08-07 — End: ?

## 2022-08-09 MED ORDER — WEGOVY 0.25 MG/0.5ML SC SOAJ
0 refills
Start: 2022-08-09 — End: ?

## 2022-08-17 MED ORDER — ROSUVASTATIN CALCIUM 10 MG PO TABS
10 mg | ORAL_TABLET | Freq: Every evening | ORAL | 0 refills | 90.00000 days
Start: 2022-08-17 — End: ?

## 2022-08-20 MED ORDER — ROSUVASTATIN CALCIUM 10 MG PO TABS
10 mg | ORAL_TABLET | Freq: Every evening | ORAL | 0 refills | Status: AC
Start: 2022-08-20 — End: ?

## 2022-10-01 MED ORDER — WEGOVY 1 MG/0.5ML SC SOAJ
0 refills
Start: 2022-10-01 — End: ?

## 2022-10-03 MED ORDER — WEGOVY 1 MG/0.5ML SC SOAJ
0 refills
Start: 2022-10-03 — End: ?

## 2022-12-11 MED ORDER — WEGOVY 1 MG/0.5ML SC SOAJ
0 refills
Start: 2022-12-11 — End: ?

## 2022-12-11 MED ORDER — WEGOVY 0.25 MG/0.5ML SC SOAJ
0 refills
Start: 2022-12-11 — End: ?

## 2022-12-13 MED ORDER — WEGOVY 1 MG/0.5ML SC SOAJ
0 refills
Start: 2022-12-13 — End: ?

## 2022-12-13 MED ORDER — WEGOVY 0.25 MG/0.5ML SC SOAJ
0 refills
Start: 2022-12-13 — End: ?

## 2023-06-01 ENCOUNTER — Ambulatory Visit: Payer: MEDICAID

## 2023-06-01 DIAGNOSIS — E119 Type 2 diabetes mellitus without complications: Secondary | ICD-10-CM

## 2023-06-01 DIAGNOSIS — Z0100 Encounter for examination of eyes and vision without abnormal findings: Secondary | ICD-10-CM

## 2023-06-01 DIAGNOSIS — Z1231 Encounter for screening mammogram for malignant neoplasm of breast: Secondary | ICD-10-CM

## 2023-10-23 ENCOUNTER — Ambulatory Visit: Payer: BLUE CROSS/BLUE SHIELD

## 2023-10-23 ENCOUNTER — Inpatient Hospital Stay
Admit: 2023-10-23 | Discharge: 2023-10-23 | Disposition: A | Discharge status: Home / Self Care | Payer: BLUE CROSS/BLUE SHIELD | Source: Home / Self Care

## 2023-10-23 DIAGNOSIS — R1013 Epigastric pain: Secondary | ICD-10-CM

## 2023-10-23 LAB — Lipase: LIPASE: 29 U/L (ref 13–75)

## 2023-10-23 LAB — Glucose,POC: GLUCOSE,POC: 153 mg/dL — ABNORMAL HIGH (ref 65–99)

## 2023-10-23 LAB — B-Type Natriuretic Peptide: BNP: 6 pg/mL (ref 0–99)

## 2023-10-23 LAB — HS Troponin I + Reflex If >=  5 ng/L: HIGH SENSITIVITY TROPONIN I: 5 ng/L (ref 3–<54)

## 2023-10-23 LAB — Basic Metabolic Panel: GLUCOSE: 165 mg/dL — ABNORMAL HIGH (ref 74–106)

## 2023-10-23 LAB — Differential Automated: ABSOLUTE LYMPHOCYTE COUNT: 1.9 10*3/uL (ref 1.30–3.40)

## 2023-10-23 LAB — CBC: MEAN CORPUSCULAR VOLUME: 87.5 fL (ref 79.3–98.6)

## 2023-10-23 MED ORDER — ALUM & MAG HYDROXIDE-SIMETH 400-400-40 MG/5ML PO SUSP
10 mL | Freq: Four times a day (QID) | ORAL | 0 refills | Status: AC | PRN
Start: 2023-10-23 — End: ?

## 2023-10-23 MED ADMIN — FAMOTIDINE 20 MG PO TABS: 20 mg | ORAL | @ 08:00:00 | Stop: 2023-10-23 | NDC 00904719361

## 2023-10-23 NOTE — ED Provider Notes
 [facility]  Emergency Department Service Report    Chelsea [name] 63 y.o. female , presents with Abdominal Pain    Triage   Arrived on 10/23/2023 at 12:04 AM   Arrived by Walk-in [14]    ED Triage Vitals   Temp Temp Source BP Heart Rate Resp SpO2 O2 Device Pain Score Weight   10/23/23 0015 10/23/23 0015 10/23/23 0015 10/23/23 0015 10/23/23 0015 10/23/23 0015 -- 10/23/23 0004 10/23/23 0004   37 ?C (98.6 ?F) Oral 122/75 84 18 98 %  Four (!) 113.4 kg (250 lb)       Pre hospital care:       Allergies   Allergen Reactions    Iodinated Contrast Media Anaphylaxis         Initial Physician Contact     Medical Screening Exam Initiated       Date/Time Event User Comments    10/23/23 0021 MSE Initiated LYON, ERIC D --          Medical Screening Exam - MD Comments      Date and Time MSE MD Comment User   10/23/23 0021 Patient here for complaint of ate a large amount of peanuts this month, notes concern for refluex. Uper abd pain, pain w/ swallowing. Heart burn and indigestion as well. EDL                   History   HPI very pleasant female comes in complaining epigastric abdominal pain after eating peanuts.  She says the  peanuts always have given her indigestion and heartburn.  She has wants to make sure heart is okay.  She has no chest pain or shortness of breath he has no fever headache or neck pain no vomiting or diarrhea no urinary complaints.      Language Assistance     Language Resource Used?: Patient declined                   Past Medical History:   Diagnosis Date    Abnormal cardiovascular stress test 06/21/2020    06/21/2020 Preop exam showed abnormal stress test with no EKG e/o ischemia but e/o large anterior wall ischemia with recommendation for cardiac catheterization. Had second opinion with repeat exercise nuclear stress test showing no e/o ischemia and was recommended against cardiac catheterization. I recommended starting Atorvastatin 40 mg every day for her DM2, but patient wishes to repeat labs fir    Diabetes mellitus (HCC/RAF) 02/20/2017    06/21/2020 Chronic, stable. Last HgA1c 6.6% on 06/03/19. C/w MFM 500 mg BID. Repeat HgA1c. Recommended starting Atorvastatin 40 mg every day but patient wishes to repeat labs first. Will need to clarify last retinopathy screening at next visit.     Hypertension, essential 06/21/2020    06/21/2020 Chronic,s table. C/w Benicar 40/25 mg x1/2 tablet every day. Reassess at next in office appointment.     Hypothyroidism 06/21/2020    Myelopathy concurrent with and due to spinal stenosis of cervical region (HCC/RAF) 06/21/2020    06/21/2020 Radiculopathy and myelopathy symptoms since forehead injury 08/25/19. MRI cervical spine 09/30/19 showing multilevel degenerative changes and central disk/osteophyte protrusion impinging the spinal cord with severe spinal stenosis and severe BL foraminal stenosis. Originally recommended for surgical intervention, which was then cancelled due to abnormal preoperative stress test. Neurosurge    OSA (obstructive sleep apnea) 06/21/2020    06/21/2020 Chronic, stable. C/w CPAP at bedtime.         Past  Surgical History:   Procedure Laterality Date    CESAREAN SECTION      x2    CHOLECYSTECTOMY  1987    INCISIONAL HERNIA REPAIR  1987        Past Family History   Family history reviewed by me and there is no pertinent past family history related to the patient's current case and/or care.             Past Social History   she reports that she has quit smoking. Her smoking use included cigarettes. She has a 1.3 pack-year smoking history. She has never used smokeless tobacco. She reports that she does not currently use alcohol. She reports that she does not currently use drugs. No history on file for sexual activity.       Physical Exam   Physical Exam  Vitals and nursing note reviewed.   Constitutional:       Appearance: She is not ill-appearing.   HENT:      Head: Atraumatic.      Mouth/Throat:      Mouth: Mucous membranes are moist.   Eyes: Conjunctiva/sclera: Conjunctivae normal.   Cardiovascular:      Rate and Rhythm: Normal rate and regular rhythm.      Pulses: Normal pulses.   Pulmonary:      Effort: Pulmonary effort is normal.      Breath sounds: Normal breath sounds.   Abdominal:      General: There is no distension.      Palpations: Abdomen is soft.      Tenderness: There is no abdominal tenderness.   Musculoskeletal:         General: Normal range of motion.      Cervical back: Normal range of motion and neck supple. No rigidity.      Right lower leg: No edema.      Left lower leg: No edema.   Skin:     General: Skin is warm and dry.   Neurological:      Mental Status: She is alert and oriented to person, place, and time.   Psychiatric:         Mood and Affect: Mood normal.         Behavior: Behavior normal.         ED Course        The patient had a normal examination was given a GI cocktail with improvement.  EKG reveals no ischemic changes x-ray of the chest was normal in all labs including CBC chemistry troponin and lipase were normal.  Laboratory Results     Labs Reviewed   BASIC METABOLIC PANEL - Abnormal; Notable for the following components:       Result Value    Anion Gap 5 (*)     Glucose 165 (*)     All other components within normal limits   CBC (PERFORMABLE) - Abnormal; Notable for the following components:    White Blood Cell Count 10.48 (*)     Mean Platelet Volume 9.2 (*)     All other components within normal limits   DIFFERENTIAL, AUTOMATED (PERFORMABLE) - Abnormal; Notable for the following components:    Absolute Neut Count 7.58 (*)     All other components within normal limits   POCT GLUCOSE - Abnormal; Notable for the following components:    Glucose, Point of Care 153 (*)     All other components within normal limits   BNP - Normal  HS TROPONIN I + REFLEX IF >= 5 NG/L - Normal   LIPASE - Normal   CBC & AUTO DIFFERENTIAL    Narrative:     The following orders were created for panel order CBC & Plt & Diff.  Procedure Abnormality         Status                     ---------                               -----------         ------                     QIO[[id number]]                          Abnormal            Final result               Differential, Automated[[id number]]      Abnormal            Final result                 Please view results for these tests on the individual orders.       Imaging Results     No orders to display       Administered Medications     Medication Administration from 10/23/2023 0004 to 10/28/2023 0515         Date/Time Order Dose Route Action Action by Comments     10/23/2023 0104 PDT famotidine tab 20 mg 20 mg Oral Given Galvan, Lesly Paola, RN --                       Procedures     ED ECG interpretation    Date/Time: 10/23/2023 1:10 AM    Performed by: Asenath Law, MD  Authorized by: Asenath Law, MD    Interpretation:     Interpretation: normal    Rate:     ECG rate:  75    ECG rate assessment: normal    Rhythm:     Rhythm: sinus rhythm    Ectopy:     Ectopy: none    QRS:     QRS axis:  Normal    QRS intervals:  Normal    QRS conduction: normal    ST segments:     ST segments:  Normal  T waves:     T waves: normal         Medical Decision Making   Chelsea [name] is a 63 y.o. female with a history of heartburn comes in complaining of epigastric abdominal pain after eating too many peanuts.  Differential diagnosis obviously inclusive but not limited to MI, pancreatitis biliary tree disease perforated viscus, duodenal ulcer, appendicitis kidney stones all were considered given the above presentation with a normal examination I do not think any further workup is necessary.  The patient be safe to discharge home    Medical Decision Making  Amount and/or Complexity of Data Reviewed  Labs: ordered.    Risk  OTC drugs.          Launch MDCalc MDM Tool   Click the link above to launch the MDCalc MDM tool. Save the tool results and then refresh the  note (lower-left corner or [id number]). The MDM tool results must be imported into the note to sign it.      Clinical Impression           Abdominal pain, acute, epigastric (Primary)      Prescriptions     Discharge Medication List as of 10/23/2023  2:31 AM        START taking these medications    Details   aluminum-magnesium hydroxide-simethicone 400-400-40 mg/5 mL suspension Take 10 mLs by mouth every six (6) hours as needed for Indigestion., Starting Tue 10/23/2023, Normal             Disposition and Follow-up   Disposition:  Discharge [1]     No future appointments.    Follow up with:  No follow-up provider specified.    Return precautions are specified on After Visit Summary.                   Asenath Law, MD  10/28/23 [phone]

## 2024-02-12 DIAGNOSIS — R509 Fever, unspecified: Secondary | ICD-10-CM

## 2024-02-12 DIAGNOSIS — B349 Viral infection, unspecified: Secondary | ICD-10-CM

## 2024-02-12 DIAGNOSIS — J069 Acute upper respiratory infection, unspecified: Secondary | ICD-10-CM

## 2024-02-12 DIAGNOSIS — U071 COVID-19: Principal | ICD-10-CM

## 2024-02-12 NOTE — Discharge Instructions
 Emergency Department Discharge Instructions      Summary of your visit  You have been evaluated in the [facility] Emergency Department today for upper respiratory symptoms.  We believe your symptoms are most likely due to a viral illness which will improve on its own with rest and fluids.    We have sent a prescription to the pharmacy for Paxlovid .  Please take it as prescribed    Medications  You can take 600mg  ibuprofen  every 6 hours or tylenol  650mg  every 6 hours as needed for fevers/pain. If needed, you can alternate these medications so that you take one medication every 3 hours. For instance, at noon take ibuprofen , then at 3pm take tylenol , then at 6pm take ibuprofen .  Talk with your healthcare provider before taking any medicines if you have a chronic condition such as diabetes, liver or kidney disease, stomach ulcers, or digestive bleeding, or are taking blood thinners.      Follow-Up  Please follow up with your primary care physician within three days after being discharged from the ER - you can call to schedule an appointment.  You can find a primary care physician at [facility] by calling [phone].    If you do not have a Primary Care Physician, please call your insurance company or you may call [phone] to establish care with a [facility] physician.  If you are uninsured, please call 2-1-1 to find a free or low-cost clinic in your area. [facility] [place] is the central source for providing information and referrals for all health and human services in [facility] [place]. Our 2-1-1 phone line is open 24 hours, 7 days a week, with trained [employer] prepared to offer help with any situation, any time. Our community services go far beyond phone referrals - explore our website to learn more. If you are calling from outside [facility] or cannot directly dial 2-1-1, you can call [phone].      Return to the Emergency Department if you experience:  Fevers 100.4 ?F or higher that you can't control using Tylenol  or Motrin   Persistent nausea and vomiting  Chest pain  Shortness of breath  Any other concerning symptoms    Thank you for choosing [facility] for your care. It was a pleasure taking part in your care today, and we wish you the best!

## 2024-02-12 NOTE — ED Notes
 Pt aox4 clear speech, able to speak in full sentences. Pt was able to walk to the restroom on her own.

## 2024-02-12 NOTE — ED Notes
 Pt wanted to speak to the Dr before taking the medication. Md aware.

## 2024-02-12 NOTE — ED Provider Notes
 [facility]  Emergency Department Service Report    Chelsea [name] 63 y.o. female , presents with Cough    Triage   Arrived on 02/12/2024 at 5:59 PM   Arrived by Walk-in [14]    ED Triage Vitals   Temp Temp Source BP Heart Rate Resp SpO2 O2 Device Pain Score Weight   02/12/24 1805 02/12/24 1805 02/12/24 1805 02/12/24 1805 02/12/24 1805 02/12/24 1805 02/12/24 1937 02/12/24 1824 02/12/24 1805   (!) 39.5 ?C (103.1 ?F) Oral 153/85 91 18 93 % None (Room air) Ten (!) 114.3 kg (252 lb)       Pre hospital care:       Allergies   Allergen Reactions    Iodinated Contrast Media Anaphylaxis         Initial Physician Contact       Initial Contact Completed?: Yes (02/12/24 2012)    History   HPI   Chelsea [name] is a 63 year old female with past medical history of diabetes, hypertension, hypothyroid, OSA, who presents to ER with concern for cough, congestion, and phlegm.  History obtained from patient at bedside.  Per patient, she states that she was in her normal state of health until about 2-3 days ago.  Notes she started having cough, congestion, URI symptoms.  States her sister was more recently hospitalized and then developed COVID.  She states she was not with her when she 1st got diagnosed with COVID, but had been with her in the days leading up to this.  She notes body aches, generalized fatigue, and feeling more tired than typical.  Denies chest pain, shortness breath, dizziness, lightheadedness.  She does note some subjective fevers and chills.  Because of her sisters results, she wanted to be evaluated in the ER for further workup and care.    Language Assistance     Language Resource Used?: Patient declined                   Past Medical History:   Diagnosis Date    Abnormal cardiovascular stress test 06/21/2020    06/21/2020 Preop exam showed abnormal stress test with no EKG e/o ischemia but e/o large anterior wall ischemia with recommendation for cardiac catheterization. Had second opinion with repeat exercise nuclear stress test showing no e/o ischemia and was recommended against cardiac catheterization. I recommended starting Atorvastatin  40 mg every day for her DM2, but patient wishes to repeat labs fir    Diabetes mellitus (HCC/RAF) 02/20/2017    06/21/2020 Chronic, stable. Last HgA1c 6.6% on 06/03/19. C/w MFM 500 mg BID. Repeat HgA1c. Recommended starting Atorvastatin  40 mg every day but patient wishes to repeat labs first. Will need to clarify last retinopathy screening at next visit.     Hypertension, essential 06/21/2020    06/21/2020 Chronic,s table. C/w Benicar  40/25 mg x1/2 tablet every day. Reassess at next in office appointment.     Hypothyroidism 06/21/2020    Myelopathy concurrent with and due to spinal stenosis of cervical region (HCC/RAF) 06/21/2020    06/21/2020 Radiculopathy and myelopathy symptoms since forehead injury 08/25/19. MRI cervical spine 09/30/19 showing multilevel degenerative changes and central disk/osteophyte protrusion impinging the spinal cord with severe spinal stenosis and severe BL foraminal stenosis. Originally recommended for surgical intervention, which was then cancelled due to abnormal preoperative stress test. Neurosurge    OSA (obstructive sleep apnea) 06/21/2020    06/21/2020 Chronic, stable. C/w CPAP at bedtime.         Past Surgical  History:   Procedure Laterality Date    CESAREAN SECTION      x2    CHOLECYSTECTOMY  1987    INCISIONAL HERNIA REPAIR  1987        Past Family History   Family history reviewed by me and there is no pertinent past family history related to the patient's current case and/or care.             Past Social History   she reports that she has quit smoking. Her smoking use included cigarettes. She has a 1.3 pack-year smoking history. She has never used smokeless tobacco. She reports that she does not currently use alcohol. She reports that she does not currently use drugs. No history on file for sexual activity.       Physical Exam   Physical Exam  Vitals and nursing note reviewed.   Constitutional:       General: She is not in acute distress.     Appearance: Normal appearance. She is normal weight.   HENT:      Head: Normocephalic and atraumatic.      Right Ear: Tympanic membrane and external ear normal. There is no impacted cerumen.      Left Ear: Tympanic membrane and external ear normal. There is no impacted cerumen.      Nose: Congestion and rhinorrhea present.      Mouth/Throat:      Mouth: Mucous membranes are moist.      Pharynx: Oropharynx is clear. No posterior oropharyngeal erythema.   Eyes:      General: No scleral icterus.     Extraocular Movements: Extraocular movements intact.      Conjunctiva/sclera: Conjunctivae normal.      Pupils: Pupils are equal, round, and reactive to light.   Cardiovascular:      Rate and Rhythm: Normal rate and regular rhythm.      Pulses: Normal pulses.      Heart sounds: Normal heart sounds. No murmur heard.  Pulmonary:      Effort: Pulmonary effort is normal.      Breath sounds: Normal breath sounds. No wheezing or rales.      Comments: Bronchial breath sounds present, but no focal crackles  Abdominal:      General: Abdomen is flat.      Palpations: Abdomen is soft.      Tenderness: There is no abdominal tenderness. There is no guarding or rebound.   Musculoskeletal:         General: No swelling, deformity or signs of injury.      Cervical back: Normal range of motion and neck supple. No rigidity.   Skin:     General: Skin is warm and dry.      Capillary Refill: Capillary refill takes less than 2 seconds.      Findings: No erythema or rash.   Neurological:      General: No focal deficit present.      Mental Status: She is alert and oriented to person, place, and time.      Sensory: No sensory deficit.      Motor: No weakness.   Psychiatric:         Mood and Affect: Mood normal.         Behavior: Behavior normal.         ED Course     ED Course as of 02/20/24 0951   Tue Feb 12, 2024   2024 COVID-19 PCR/TMA(!): Detected  COVID  positive. [DA]   2122 Updated patient at bedside of workup and results.  States she is feeling improved after Tylenol  and ibuprofen  administration [DA]      ED Course User Index  [DA] Leonides Ismael FERNS., MD         Laboratory Results     Labs Reviewed   EXPEDITED COVID-19 AND INFLUENZA A B PCR, RESPIRATORY UPPER - Abnormal; Notable for the following components:       Result Value    COVID-19 PCR/TMA Detected (*)     All other components within normal limits    Narrative:     Influenza A and Influenza B negative results should be considered presumptive in samples that have a positive COVID-19 result.         Imaging Results     No orders to display       Administered Medications     Medication Administration from 02/12/2024 1759 to 02/20/2024 9048         Date/Time Order Dose Route Action Action by Comments     02/12/2024 1825 PDT ibuprofen  tab 600 mg 600 mg Oral Given Jackson Ellena Power, RN --     02/12/2024 1824 PDT acetaminophen  tab 650 mg 650 mg Oral Given Jackson Ellena Power, RN --     02/12/2024 1943 PDT sodium chloride  0.9% IV soln bolus 1,000 mL 0 mL Intravenous Stopped Jackson Ellena Power, RN --     02/12/2024 1826 PDT sodium chloride  0.9% IV soln bolus 1,000 mL 1,000 mL Intravenous New Bag/ Syringe/ Cartridge Jackson Ellena Power, RN --     02/13/2024 0016 PDT ketorolac  15 mg/mL inj 15 mg 15 mg IV Push Given Boskovich, Almarie Jansky, RN --                       Procedures     Procedures     Medical Decision Making   Krystie Leiter is a 63 y.o. female with past medical history as documented above who presents to ER with concern for upper respiratory symptoms, likely secondary to COVID-19 infection.  Given her comorbidities, will plan for COVID testing to definitively diagnosed.  Her vital signs are stable.  Initially present with some fever, but this improved following antipyretics and fluid administration on repeat tissue perfusion exam.  Because of her age and risk factors, patient would be a candidate for Paxlovid , so will send COVID and reassess.  Dispo pending workup, results, and clinical improvement    Medical Decision Making  Amount and/or Complexity of Data Reviewed  Labs: ordered. Decision-making details documented in ED Course.    Risk  OTC drugs.  Prescription drug management.          Launch MDCalc MDM Tool   MDCalc MDM Module  Feb 20 2024 9:51 AM May Vivianna Piccini]  Data:  - Test/documents/historian: 3 tests ordered  Problems: Concern for SIRS  Additional encounter diagnoses: COVID-19, Upper respiratory virus, Fever, unspecified fever cause, Acute viral syndrome  Risk: sodium chloride  IV soln bolus + 2 more (Rx drug management)                    Clinical Impression           COVID-19 (Primary)  Upper respiratory virus  Fever, unspecified fever cause  Acute viral syndrome      Prescriptions     Discharge Medication List as of 02/13/2024 12:38 AM          Disposition  and Follow-up   Disposition:  Discharge [1] 02/13/2024 12:16 AM    No future appointments.    Follow up with:  Rilla Greenhouse, DO  [address]  [place] [address]  [phone]    Schedule an appointment as soon as possible for a visit         Return precautions are specified on After Visit Summary.                     Ja Pistole I., MD  02/20/24 [phone]

## 2024-02-13 ENCOUNTER — Inpatient Hospital Stay
Admit: 2024-02-13 | Discharge: 2024-02-13 | Disposition: A | Discharge status: Home / Self Care | Payer: MEDICAID | Source: Home / Self Care

## 2024-02-13 LAB — Expedited COVID-19 and Influenza A B PCR

## 2024-02-13 MED ORDER — NIRMATRELVIR&RITONAVIR 300/100 20 X 150 MG & 10 X 100MG PO TBPK
ORAL | 0 refills | 5.00000 days | Status: AC
Start: 2024-02-13 — End: 2024-02-14

## 2024-02-13 MED ORDER — NIRMATRELVIR&RITONAVIR 300/100 20 X 150 MG & 10 X 100MG PO TBPK
ORAL | 0 refills | 5.00000 days | Status: AC
Start: 2024-02-13 — End: 2024-02-13

## 2024-02-13 MED ORDER — NIRMATRELVIR&RITONAVIR 300/100 20 X 150 MG & 10 X 100MG PO TBPK
ORAL | 0 refills | 5.00000 days | Status: AC
Start: 2024-02-13 — End: ?

## 2024-02-13 MED ADMIN — SODIUM CHLORIDE 0.9 % IV BOLUS: 1000 mL | INTRAVENOUS | @ 01:00:00 | Stop: 2024-02-13 | NDC 00409798309

## 2024-02-13 MED ADMIN — ACETAMINOPHEN 325 MG PO TABS: 650 mg | ORAL | @ 01:00:00 | Stop: 2024-02-13 | NDC 50580045811

## 2024-02-13 MED ADMIN — IBUPROFEN 600 MG PO TABS: 600 mg | ORAL | @ 01:00:00 | Stop: 2024-02-13 | NDC 60687045711

## 2024-02-13 MED ADMIN — KETOROLAC TROMETHAMINE 15 MG/ML IJ SOLN: 15 mg | INTRAVENOUS | @ 07:00:00 | Stop: 2024-02-13 | NDC 68462075501
